# Patient Record
Sex: Male | Born: 1955 | Race: White | Hispanic: No | Marital: Single | Smoking: Never smoker
Health system: Southern US, Community
[De-identification: ages and names within clinical notes are randomized; demographics above are authoritative.]

## PROBLEM LIST (undated history)

## (undated) DIAGNOSIS — M5430 Sciatica, unspecified side: Secondary | ICD-10-CM

## (undated) DIAGNOSIS — R933 Abnormal findings on diagnostic imaging of other parts of digestive tract: Secondary | ICD-10-CM

## (undated) DIAGNOSIS — Z9852 Vasectomy status: Secondary | ICD-10-CM

## (undated) DIAGNOSIS — Z9049 Acquired absence of other specified parts of digestive tract: Secondary | ICD-10-CM

## (undated) DIAGNOSIS — N400 Enlarged prostate without lower urinary tract symptoms: Secondary | ICD-10-CM

## (undated) DIAGNOSIS — D649 Anemia, unspecified: Secondary | ICD-10-CM

## (undated) DIAGNOSIS — K219 Gastro-esophageal reflux disease without esophagitis: Secondary | ICD-10-CM

## (undated) DIAGNOSIS — C189 Malignant neoplasm of colon, unspecified: Secondary | ICD-10-CM

## (undated) DIAGNOSIS — G629 Polyneuropathy, unspecified: Secondary | ICD-10-CM

## (undated) DIAGNOSIS — K649 Unspecified hemorrhoids: Secondary | ICD-10-CM

## (undated) DIAGNOSIS — Z9889 Other specified postprocedural states: Secondary | ICD-10-CM

## (undated) HISTORY — DX: Abnormal findings on diagnostic imaging of other parts of digestive tract: R93.3

## (undated) HISTORY — DX: Vasectomy status: Z98.52

## (undated) HISTORY — PX: APPENDECTOMY: SHX54

## (undated) HISTORY — PX: VASECTOMY: SHX75

## (undated) HISTORY — DX: Other specified postprocedural states: Z98.890

## (undated) HISTORY — DX: Acquired absence of other specified parts of digestive tract: Z90.49

## (undated) HISTORY — DX: Sciatica, unspecified side: M54.30

## (undated) HISTORY — DX: Gastro-esophageal reflux disease without esophagitis: K21.9

## (undated) HISTORY — DX: Unspecified hemorrhoids: K64.9

## (undated) HISTORY — DX: Benign prostatic hyperplasia without lower urinary tract symptoms: N40.0

---

## 1979-05-08 HISTORY — PX: VEIN LIGATION: SHX2652

## 1979-09-07 DIAGNOSIS — Z9889 Other specified postprocedural states: Secondary | ICD-10-CM

## 1979-09-07 HISTORY — DX: Other specified postprocedural states: Z98.890

## 1988-09-06 DIAGNOSIS — Z9852 Vasectomy status: Secondary | ICD-10-CM

## 1988-09-06 HISTORY — DX: Vasectomy status: Z98.52

## 2011-12-08 ENCOUNTER — Emergency Department: Payer: Self-pay | Admitting: Emergency Medicine

## 2016-01-01 ENCOUNTER — Encounter: Payer: Self-pay | Admitting: *Deleted

## 2016-01-15 ENCOUNTER — Encounter: Payer: Self-pay | Admitting: General Surgery

## 2016-01-15 ENCOUNTER — Ambulatory Visit (INDEPENDENT_AMBULATORY_CARE_PROVIDER_SITE_OTHER): Payer: BLUE CROSS/BLUE SHIELD | Admitting: General Surgery

## 2016-01-15 DIAGNOSIS — K219 Gastro-esophageal reflux disease without esophagitis: Secondary | ICD-10-CM

## 2016-01-15 DIAGNOSIS — D649 Anemia, unspecified: Secondary | ICD-10-CM

## 2016-01-15 MED ORDER — POLYETHYLENE GLYCOL 3350 17 GM/SCOOP PO POWD: ORAL | Status: DC

## 2016-01-15 NOTE — Patient Instructions (Addendum)
Colonoscopy A colonoscopy is an exam to look at the entire large intestine (colon). This exam can help find problems such as tumors, polyps, inflammation, and areas of bleeding. The exam takes about 1 hour.  LET [facility] CARE PROVIDER KNOW ABOUT:   Any allergies you have.  All medicines you are taking, including vitamins, herbs, eye drops, creams, and over-the-counter medicines.  Previous problems you or members of your family have had with the use of anesthetics.  Any blood disorders you have.  Previous surgeries you have had.  Medical conditions you have. RISKS AND COMPLICATIONS  Generally, this is a safe procedure. However, as with any procedure, complications can occur. Possible complications include:  Bleeding.  Tearing or rupture of the colon wall.  Reaction to medicines given during the exam.  Infection (rare). BEFORE THE PROCEDURE   Ask your health care provider about changing or stopping your regular medicines.  You may be prescribed an oral bowel prep. This involves drinking a large amount of medicated liquid, starting the day before your procedure. The liquid will cause you to have multiple loose stools until your stool is almost clear or light green. This cleans out your colon in preparation for the procedure.  Do not eat or drink anything else once you have started the bowel prep, unless your health care provider tells you it is safe to do so.  Arrange for someone to drive you home after the procedure. PROCEDURE   You will be given medicine to help you relax (sedative).  You will lie on your side with your knees bent.  A long, flexible tube with a light and camera on the end (colonoscope) will be inserted through the rectum and into the colon. The camera sends video back to a computer screen as it moves through the colon. The colonoscope also releases carbon dioxide gas to inflate the colon. This helps your health care provider see the area better.  During  the exam, your health care provider may take a small tissue sample (biopsy) to be examined under a microscope if any abnormalities are found.  The exam is finished when the entire colon has been viewed. AFTER THE PROCEDURE   Do not drive for 24 hours after the exam.  You may have a small amount of blood in your stool.  You may pass moderate amounts of gas and have mild abdominal cramping or bloating. This is caused by the gas used to inflate your colon during the exam.  Ask when your test results will be ready and how you will get your results. Make sure you get your test results.   This information is not intended to replace advice given to you by your health care provider. Make sure you discuss any questions you have with your health care provider.   Document Released: 08/20/2000 Document Revised: 06/13/2013 Document Reviewed: 04/30/2013 Elsevier Interactive Patient Education 2016 [place]. Esophagogastroduodenoscopy Esophagogastroduodenoscopy (EGD) is a procedure that is used to examine the lining of the esophagus, stomach, and first part of the small intestine (duodenum). A long, flexible, lighted tube with a camera attached (endoscope) is inserted down the throat to view these organs. This procedure is done to detect problems or abnormalities, such as inflammation, bleeding, ulcers, or growths, in order to treat them. The procedure lasts 5-20 minutes. It is usually an outpatient procedure, but it may need to be performed in a hospital in emergency cases. LET [facility] CARE PROVIDER KNOW ABOUT:  Any allergies you have.  All medicines you are taking, including vitamins, herbs, eye drops, creams, and over-the-counter medicines.  Previous problems you or members of your family have had with the use of anesthetics.  Any blood disorders you have.  Previous surgeries you have had.  Medical conditions you have. RISKS AND COMPLICATIONS Generally, this is a safe procedure. However,  problems can occur and include:  Infection.  Bleeding.  Tearing (perforation) of the esophagus, stomach, or duodenum.  Difficulty breathing or not being able to breathe.  Excessive sweating.  Spasms of the larynx.  Slowed heartbeat.  Low blood pressure. BEFORE THE PROCEDURE  Do not eat or drink anything after midnight on the night before the procedure or as directed by your health care provider.  Do not take your regular medicines before the procedure if your health care provider asks you not to. Ask your health care provider about changing or stopping those medicines.  If you wear dentures, be prepared to remove them before the procedure.  Arrange for someone to drive you home after the procedure. PROCEDURE  A numbing medicine (local anesthetic) may be sprayed in your throat for comfort and to stop you from gagging or coughing.  You will have an IV tube inserted in a vein in your hand or arm. You will receive medicines and fluids through this tube.  You will be given a medicine to relax you (sedative).  A pain reliever will be given through the IV tube.  A mouth guard may be placed in your mouth to protect your teeth and to keep you from biting on the endoscope.  You will be asked to lie on your left side.  The endoscope will be inserted down your throat and into your esophagus, stomach, and duodenum.  Air will be put through the endoscope to allow your health care provider to clearly view the lining of your esophagus.  The lining of your esophagus, stomach, and duodenum will be examined. During the exam, your health care provider may:  Remove tissue to be examined under a microscope (biopsy) for inflammation, infection, or other medical problems.  Remove growths.  Remove objects (foreign bodies) that are stuck.  Treat any bleeding with medicines or other devices that stop tissues from bleeding (hot cautery, clipping devices).  Widen (dilate) or stretch narrowed  areas of your esophagus and stomach.  The endoscope will be withdrawn. AFTER THE PROCEDURE  You will be taken to a recovery area for observation. Your blood pressure, heart rate, breathing rate, and blood oxygen level will be monitored often until the medicines you were given have worn off.  Do not eat or drink anything until the numbing medicine has worn off and your gag reflex has returned. You may choke.  Your health care provider should be able to discuss his or her findings with you. It will take longer to discuss the test results if any biopsies were taken.   This information is not intended to replace advice given to you by your health care provider. Make sure you discuss any questions you have with your health care provider.   Document Released: 12/24/2004 Document Revised: 09/13/2014 Document Reviewed: 07/26/2012 Elsevier Interactive Patient Education [employer].  Patient has been scheduled for a colonoscopy on 02-11-16 at [facility]. This patient has been asked to discontinue fish oil one week prior to procedure.

## 2016-01-15 NOTE — Progress Notes (Signed)
Patient ID: Philip Shah, male   DOB: [birth year], 60 y.o.   MRN: [id number]  Chief Complaint  Patient presents with  . Colonoscopy    HPI Philip Shah is a 60 y.o. male.  Who presents for a colonoscopy discussion, none prior. He has not seen a physical in several years. Denies any gastrointestinal issues. Bowels move regular with occasional bleeding from his hemorrhoids. He does do a lot of heavy lifting during his job. He states his labs showed he was anemic. No family history of colon cancer. I have reviewed the history of present illness with the patient. HPI  Past Medical History  Diagnosis Date  . Enlarged prostate   . GERD (gastroesophageal reflux disease)   . Sciatica   . Hemorrhoids     Past Surgical History  Procedure Laterality Date  . Vein ligation Right 1980's    Dr Pat Kocher  . Vasectomy      Family History  Problem Relation Age of Onset  . Cancer Father     spine  . Kidney failure Mother     Social History Social History  Substance Use Topics  . Smoking status: Never Smoker   . Smokeless tobacco: Never Used  . Alcohol Use: 0.0 oz/week    0 Standard drinks or equivalent per week     Comment: 3/day    Allergies  Allergen Reactions  . Penicillins     Current Outpatient Prescriptions  Medication Sig Dispense Refill  . Omega-3 Fatty Acids (FISH OIL EXTRA STRENGTH PO) Take by mouth every other day.    . Saw Palmetto 80 MG CAPS Take by mouth every other day.    . polyethylene glycol powder (GLYCOLAX/MIRALAX) powder 255 grams one bottle for colonoscopy prep 255 g 0   No current facility-administered medications for this visit.    Review of Systems Review of Systems  Constitutional: Negative.   Respiratory: Negative.   Cardiovascular: Negative.   Gastrointestinal: Positive for blood in stool. Negative for nausea, diarrhea and constipation.    Blood pressure 142/76, pulse 84, resp. rate 12, height 5\' 7"  (1.702 m), weight 205 lb  (92.987 kg).  Physical Exam Physical Exam  Constitutional: He is oriented to person, place, and time. He appears well-developed and well-nourished.  HENT:  Mouth/Throat: Oropharynx is clear and moist.  Eyes: Conjunctivae are normal. No scleral icterus.  Neck: Neck supple.  Cardiovascular: Normal rate, regular rhythm and normal heart sounds.   Pulses:      Femoral pulses are 2+ on the right side, and 2+ on the left side. Pulmonary/Chest: Effort normal and breath sounds normal.  Abdominal: Soft. Normal appearance and bowel sounds are normal. There is no tenderness. A hernia is present. Hernia confirmed negative in the right inguinal area and confirmed negative in the left inguinal area.  Small umbilical hernia.  Lymphadenopathy:    He has no cervical adenopathy.  Neurological: He is alert and oriented to person, place, and time.  Skin: Skin is warm and dry.  Psychiatric: His behavior is normal.    Data Reviewed Progress notes and labs. Hgb is 9.3 with microcytic indices  Assessment    Stable physical exam. Anemia and GERD.    Plan    Upper endoscopy and colonoscopy with possible biopsy/polypectomy prn: Information regarding the procedure, including its potential risks and complications (including but not limited to perforation of the bowel, which may require emergency surgery to repair, and bleeding) was verbally given to the patient. Educational information regarding  lower intestinal endoscopy was given to the patient. Written instructions for how to complete the bowel prep using Miralax were provided. The importance of drinking ample fluids to avoid dehydration as a result of the prep emphasized. Pt is agreeable to procedure. Stool cards given to pt to bring back Patient has been scheduled for a colonoscopy on 02-11-16 at [facility]. This patient has been asked to discontinue fish oil one week prior to procedure.      PCP:  Philip Shah  This information has been scribed by Karie Fetch RN, BSN,BC.   Claiborne Stroble G 01/15/2016, 11:41 AM

## 2016-01-19 ENCOUNTER — Ambulatory Visit (INDEPENDENT_AMBULATORY_CARE_PROVIDER_SITE_OTHER): Payer: BLUE CROSS/BLUE SHIELD | Admitting: *Deleted

## 2016-01-19 DIAGNOSIS — K921 Melena: Secondary | ICD-10-CM | POA: Diagnosis not present

## 2016-01-19 LAB — POC HEMOCCULT BLD/STL (HOME/3-CARD/SCREEN)
FECAL OCCULT BLD: POSITIVE
FECAL OCCULT BLD: POSITIVE
Fecal Occult Blood, POC: POSITIVE — AB

## 2016-01-19 NOTE — Progress Notes (Signed)
Stool cards from home 01/19/16. Positive.

## 2016-02-05 DIAGNOSIS — R933 Abnormal findings on diagnostic imaging of other parts of digestive tract: Secondary | ICD-10-CM

## 2016-02-05 HISTORY — DX: Abnormal findings on diagnostic imaging of other parts of digestive tract: R93.3

## 2016-02-10 ENCOUNTER — Encounter: Payer: Self-pay | Admitting: *Deleted

## 2016-02-11 ENCOUNTER — Encounter
Admission: RE | Disposition: A | Discharge status: Home / Self Care | Payer: Self-pay | Source: Ambulatory Visit | Attending: General Surgery

## 2016-02-11 ENCOUNTER — Ambulatory Visit: Payer: BLUE CROSS/BLUE SHIELD | Admitting: Anesthesiology

## 2016-02-11 ENCOUNTER — Encounter: Payer: Self-pay | Admitting: *Deleted

## 2016-02-11 ENCOUNTER — Other Ambulatory Visit: Payer: Self-pay

## 2016-02-11 ENCOUNTER — Ambulatory Visit
Admission: RE | Admit: 2016-02-11 | Discharge: 2016-02-11 | Disposition: A | Discharge status: Home / Self Care | Payer: BLUE CROSS/BLUE SHIELD | Source: Ambulatory Visit | Attending: General Surgery | Admitting: General Surgery

## 2016-02-11 ENCOUNTER — Telehealth: Payer: Self-pay

## 2016-02-11 DIAGNOSIS — D509 Iron deficiency anemia, unspecified: Secondary | ICD-10-CM | POA: Diagnosis present

## 2016-02-11 DIAGNOSIS — Z880 Allergy status to penicillin: Secondary | ICD-10-CM | POA: Diagnosis not present

## 2016-02-11 DIAGNOSIS — K6389 Other specified diseases of intestine: Secondary | ICD-10-CM

## 2016-02-11 DIAGNOSIS — K219 Gastro-esophageal reflux disease without esophagitis: Secondary | ICD-10-CM | POA: Diagnosis not present

## 2016-02-11 DIAGNOSIS — N400 Enlarged prostate without lower urinary tract symptoms: Secondary | ICD-10-CM | POA: Insufficient documentation

## 2016-02-11 DIAGNOSIS — K5790 Diverticulosis of intestine, part unspecified, without perforation or abscess without bleeding: Secondary | ICD-10-CM | POA: Insufficient documentation

## 2016-02-11 DIAGNOSIS — C182 Malignant neoplasm of ascending colon: Secondary | ICD-10-CM | POA: Diagnosis not present

## 2016-02-11 DIAGNOSIS — K921 Melena: Secondary | ICD-10-CM

## 2016-02-11 DIAGNOSIS — R195 Other fecal abnormalities: Secondary | ICD-10-CM | POA: Diagnosis not present

## 2016-02-11 HISTORY — PX: COLONOSCOPY WITH PROPOFOL: SHX5780

## 2016-02-11 HISTORY — PX: ESOPHAGOGASTRODUODENOSCOPY (EGD) WITH PROPOFOL: SHX5813

## 2016-02-11 SURGERY — COLONOSCOPY WITH PROPOFOL
Anesthesia: General

## 2016-02-11 MED ORDER — PHENYLEPHRINE HCL 10 MG/ML IJ SOLN
INTRAMUSCULAR | Status: DC | PRN
  Administered 2016-02-11 (×2): 100 ug via INTRAVENOUS

## 2016-02-11 MED ORDER — PROPOFOL 500 MG/50ML IV EMUL
INTRAVENOUS | Status: DC | PRN
  Administered 2016-02-11: 200 ug/kg/min via INTRAVENOUS

## 2016-02-11 MED ORDER — PROPOFOL 10 MG/ML IV BOLUS
INTRAVENOUS | Status: DC | PRN
  Administered 2016-02-11: 20 mg via INTRAVENOUS
  Administered 2016-02-11: 100 mg via INTRAVENOUS

## 2016-02-11 MED ORDER — SODIUM CHLORIDE 0.9 % IV SOLN
INTRAVENOUS | Status: DC
  Administered 2016-02-11: 1000 mL via INTRAVENOUS
  Administered 2016-02-11: 11:00:00 via INTRAVENOUS

## 2016-02-11 MED ORDER — LIDOCAINE 2% (20 MG/ML) 5 ML SYRINGE
INTRAMUSCULAR | Status: DC | PRN
  Administered 2016-02-11: 40 mg via INTRAVENOUS

## 2016-02-11 MED ORDER — MIDAZOLAM HCL 5 MG/5ML IJ SOLN
INTRAMUSCULAR | Status: DC | PRN
  Administered 2016-02-11: 1 mg via INTRAVENOUS

## 2016-02-11 MED ORDER — FENTANYL CITRATE (PF) 100 MCG/2ML IJ SOLN
INTRAMUSCULAR | Status: DC | PRN
  Administered 2016-02-11: 50 ug via INTRAVENOUS

## 2016-02-11 NOTE — Anesthesia Preprocedure Evaluation (Signed)
Anesthesia Evaluation  Patient identified by MRN, date of birth, ID band Patient awake    Reviewed: Allergy & Precautions, H&P , NPO status , Patient's Chart, lab work & pertinent test results, reviewed documented beta blocker date and time   History of Anesthesia Complications Negative for: history of anesthetic complications  Airway Mallampati: III  TM Distance: >3 FB Neck ROM: full    Dental no notable dental hx. (+) Caps   Pulmonary neg pulmonary ROS,    Pulmonary exam normal breath sounds clear to auscultation       Cardiovascular Exercise Tolerance: Good negative cardio ROS Normal cardiovascular exam Rhythm:regular Rate:Normal     Neuro/Psych neg Seizures  Neuromuscular disease (Sciatica) negative neurological ROS  negative psych ROS   GI/Hepatic Neg liver ROS, GERD  ,  Endo/Other  negative endocrine ROS  Renal/GU negative Renal ROS  negative genitourinary   Musculoskeletal   Abdominal   Peds  Hematology negative hematology ROS (+)   Anesthesia Other Findings Past Medical History:   Enlarged prostate                                            GERD (gastroesophageal reflux disease)                       Hemorrhoids                                                  Sciatica                                                     Reproductive/Obstetrics negative OB ROS                             Anesthesia Physical Anesthesia Plan  ASA: II  Anesthesia Plan: General   Post-op Pain Management:    Induction:   Airway Management Planned:   Additional Equipment:   Intra-op Plan:   Post-operative Plan:   Informed Consent: I have reviewed the patients History and Physical, chart, labs and discussed the procedure including the risks, benefits and alternatives for the proposed anesthesia with the patient or authorized representative who has indicated his/her understanding and  acceptance.   Dental Advisory Given  Plan Discussed with: Anesthesiologist, CRNA and Surgeon  Anesthesia Plan Comments:         Anesthesia Quick Evaluation

## 2016-02-11 NOTE — Op Note (Signed)
[facility]astroenterology Patient Name: Philip Shah Procedure Date: 02/11/2016 10:35 AM MRN: [id number] Account #: [id number] Date of Birth: Jan 24, 1956 Admit Type: Outpatient Age: 60 Room: [facility] ENDO ROOM 1 Gender: Male Note Status: Finalized Procedure:            Colonoscopy Indications:          Heme positive stool, Iron deficiency anemia Providers:            Seeplaputhur G. Jamal Collin, MD Referring MD:         Mikeal Hawthorne. Brynda Greathouse, MD (Referring MD) Medicines:            General Anesthesia Complications:        No immediate complications. Procedure:            Pre-Anesthesia Assessment:                       - General anesthesia under the supervision of an                        anesthesiologist was determined to be medically                        necessary for this procedure based on review of the                        patient's medical history, medications, and prior                        anesthesia history.                       After obtaining informed consent, the colonoscope was                        passed under direct vision. Throughout the procedure,                        the patient's blood pressure, pulse, and oxygen                        saturations were monitored continuously. The                        Colonoscope was introduced through the anus and                        advanced to the the cecum, identified by the ileocecal                        valve. The colonoscopy was performed without                        difficulty. The patient tolerated the procedure well.                        The quality of the bowel preparation was good. Findings:      The perianal and digital rectal examinations were normal.      A 50 mm polypoid lesion was found in the mid ascending colon. The lesion       was sessile. This was biopsied with a cold forceps  for histology.      The exam was otherwise without abnormality on direct and retroflexion   views.      The exam was otherwise without abnormality on direct and retroflexion       views. 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                        Biopsied.                       - The examination was otherwise normal on direct and                        retroflexion views.                       - The examination was otherwise normal on direct and                        retroflexion views. Recommendation:       - Discharge patient to home.                       - Perform CT scan (computed tomography) of the abdomen                        with contrast. Procedure Code(s):    --- Professional ---                       [phone], Colonoscopy, flexible; with biopsy, single or                        multiple Diagnosis Code(s):    --- Professional ---                       C18.2, Malignant neoplasm of ascending colon                       R19.5, Other fecal abnormalities                       D50.9, Iron deficiency anemia, unspecified CPT copyright 2016 American Medical Association. All rights reserved. The codes documented in this report are preliminary and upon coder review may  be revised to meet current compliance requirements. Christene Lye, MD 02/11/2016 11:25:44 AM This report has been signed electronically. Number of Addenda: 0 Note Initiated On: 02/11/2016 10:35 AM Scope Withdrawal Time: 0 hours 11 minutes 17 seconds  Total Procedure Duration: 0 hours 21 minutes 7 seconds       [facility]

## 2016-02-11 NOTE — Anesthesia Postprocedure Evaluation (Signed)
Anesthesia Post Note  Patient: Philip Shah  Procedure(s) Performed: Procedure(s) (LRB): COLONOSCOPY WITH PROPOFOL (N/A) ESOPHAGOGASTRODUODENOSCOPY (EGD) WITH PROPOFOL (N/A)  Patient location during evaluation: Endoscopy Anesthesia Type: General Level of consciousness: awake and alert Pain management: pain level controlled Vital Signs Assessment: post-procedure vital signs reviewed and stable Respiratory status: spontaneous breathing, nonlabored ventilation, respiratory function stable and patient connected to nasal cannula oxygen Cardiovascular status: blood pressure returned to baseline and stable Postop Assessment: no signs of nausea or vomiting Anesthetic complications: no    Last Vitals:  Filed Vitals:   02/11/16 1148 02/11/16 1158  BP: 133/79 132/85  Pulse: 73 66  Temp:    Resp: 19 21    Last Pain: There were no vitals filed for this visit.               Martha Clan

## 2016-02-11 NOTE — Interval H&P Note (Signed)
History and Physical Interval Note:  02/11/2016 10:21 AM  Philip Shah  has presented today for surgery, with the diagnosis of MICROCYTIC ANEMIA  The various methods of treatment have been discussed with the patient and family. After consideration of risks, benefits and other options for treatment, the patient has consented to  Procedure(s): COLONOSCOPY WITH PROPOFOL (N/A) ESOPHAGOGASTRODUODENOSCOPY (EGD) WITH PROPOFOL (N/A) as a surgical intervention .  The patient's history has been reviewed, patient examined, no change in status, stable for surgery.  I have reviewed the patient's chart and labs.  Questions were answered to the patient's satisfaction.     SANKAR,SEEPLAPUTHUR G

## 2016-02-11 NOTE — Telephone Encounter (Signed)
Message left for patient to call about scheduling at CT of the abdomen/pelvis with contrast.

## 2016-02-11 NOTE — Transfer of Care (Signed)
Immediate Anesthesia Transfer of Care Note  Patient: Philip Shah  Procedure(s) Performed: Procedure(s): COLONOSCOPY WITH PROPOFOL (N/A) ESOPHAGOGASTRODUODENOSCOPY (EGD) WITH PROPOFOL (N/A)  Patient Location: PACU and Endoscopy Unit  Anesthesia Type:General  Level of Consciousness: sedated  Airway & Oxygen Therapy: Patient Spontanous Breathing and Patient connected to nasal cannula oxygen  Post-op Assessment: Report given to RN and Post -op Vital signs reviewed and stable  Post vital signs: Reviewed and stable  Last Vitals:  Filed Vitals:   02/11/16 0954  BP: 155/93  Pulse: 78  Temp: 36.5 C  Resp: 20    Last Pain: There were no vitals filed for this visit.       Complications: No apparent anesthesia complications

## 2016-02-11 NOTE — Op Note (Signed)
[facility]astroenter[facility] Patient Name: Philip Shah Procedure Date: 02/11/2016 10:36 AM MRN: [id number] Account #: [id number] Date of Birth: 18-Oct-1955 Admit Type: Outpatient Age: 60 Room: [facility] ENDO ROOM 1 Gender: Male Note Status: Finalized Procedure:            Upper GI endoscopy Indications:          Iron deficiency anemia Providers:            Seeplaputhur G. Jamal Collin, MD Referring MD:         Mikeal Hawthorne. Brynda Greathouse, MD (Referring MD) Medicines:            General Anesthesia Complications:        No immediate complications. Procedure:            Pre-Anesthesia Assessment:                       - General anesthesia under the supervision of an                        anesthesiologist was determined to be medically                        necessary for this procedure based on review of the                        patient's medical history, medications, and prior                        anesthesia history.                       After obtaining informed consent, the endoscope was                        passed under direct vision. Throughout the procedure,                        the patient's blood pressure, pulse, and oxygen                        saturations were monitored continuously. The Endoscope                        was introduced through the mouth, and advanced to the                        third part of duodenum. The upper GI endoscopy was                        accomplished without difficulty. The patient tolerated                        the procedure well. Findings:      The esophagus was normal.      The stomach was normal.      The examined duodenum was normal. Impression:           - Normal esophagus.                       - Normal stomach.                       -  Normal examined duodenum.                       - No specimens collected. Recommendation:       - Perform a colonoscopy today. Procedure Code(s):    --- Professional ---     [phone], Esophagogastroduodenoscopy, flexible, transoral;                        diagnostic, including collection of specimen(s) by                        brushing or washing, when performed (separate procedure) Diagnosis Code(s):    --- Professional ---                       D50.9, Iron deficiency anemia, unspecified CPT copyright 2016 American Medical Association. All rights reserved. The codes documented in this report are preliminary and upon coder review may  be revised to meet current compliance requirements. Christene Lye, MD 02/11/2016 10:55:07 AM This report has been signed electronically. Number of Addenda: 0 Note Initiated On: 02/11/2016 10:36 AM Total Procedure Duration: 0 hours 6 minutes 4 seconds       [facility]

## 2016-02-11 NOTE — H&P (View-Only) (Signed)
Patient ID: Philip Shah, male   DOB: 1955-10-29, 59 y.o.   MRN: [id number]  Chief Complaint  Patient presents with  . Colonoscopy    HPI Philip Shah is a 60 y.o. male.  Who presents for a colonoscopy discussion, none prior. He has not seen a physical in several years. Denies any gastrointestinal issues. Bowels move regular with occasional bleeding from his hemorrhoids. He does do a lot of heavy lifting during his job. He states his labs showed he was anemic. No family history of colon cancer. I have reviewed the history of present illness with the patient. HPI  Past Medical History  Diagnosis Date  . Enlarged prostate   . GERD (gastroesophageal reflux disease)   . Sciatica   . Hemorrhoids     Past Surgical History  Procedure Laterality Date  . Vein ligation Right 1980's    Dr Pat Kocher  . Vasectomy      Family History  Problem Relation Age of Onset  . Cancer Father     spine  . Kidney failure Mother     Social History Social History  Substance Use Topics  . Smoking status: Never Smoker   . Smokeless tobacco: Never Used  . Alcohol Use: 0.0 oz/week    0 Standard drinks or equivalent per week     Comment: 3/day    Allergies  Allergen Reactions  . Penicillins     Current Outpatient Prescriptions  Medication Sig Dispense Refill  . Omega-3 Fatty Acids (FISH OIL EXTRA STRENGTH PO) Take by mouth every other day.    . Saw Palmetto 80 MG CAPS Take by mouth every other day.    . polyethylene glycol powder (GLYCOLAX/MIRALAX) powder 255 grams one bottle for colonoscopy prep 255 g 0   No current facility-administered medications for this visit.    Review of Systems Review of Systems  Constitutional: Negative.   Respiratory: Negative.   Cardiovascular: Negative.   Gastrointestinal: Positive for blood in stool. Negative for nausea, diarrhea and constipation.    Blood pressure 142/76, pulse 84, resp. rate 12, height 5\' 7"  (1.702 m), weight 205 lb  (92.987 kg).  Physical Exam Physical Exam  Constitutional: He is oriented to person, place, and time. He appears well-developed and well-nourished.  HENT:  Mouth/Throat: Oropharynx is clear and moist.  Eyes: Conjunctivae are normal. No scleral icterus.  Neck: Neck supple.  Cardiovascular: Normal rate, regular rhythm and normal heart sounds.   Pulses:      Femoral pulses are 2+ on the right side, and 2+ on the left side. Pulmonary/Chest: Effort normal and breath sounds normal.  Abdominal: Soft. Normal appearance and bowel sounds are normal. There is no tenderness. A hernia is present. Hernia confirmed negative in the right inguinal area and confirmed negative in the left inguinal area.  Small umbilical hernia.  Lymphadenopathy:    He has no cervical adenopathy.  Neurological: He is alert and oriented to person, place, and time.  Skin: Skin is warm and dry.  Psychiatric: His behavior is normal.    Data Reviewed Progress notes and labs. Hgb is 9.3 with microcytic indices  Assessment    Stable physical exam. Anemia and GERD.    Plan    Upper endoscopy and colonoscopy with possible biopsy/polypectomy prn: Information regarding the procedure, including its potential risks and complications (including but not limited to perforation of the bowel, which may require emergency surgery to repair, and bleeding) was verbally given to the patient. Educational information regarding  lower intestinal endoscopy was given to the patient. Written instructions for how to complete the bowel prep using Miralax were provided. The importance of drinking ample fluids to avoid dehydration as a result of the prep emphasized. Pt is agreeable to procedure. Stool cards given to pt to bring back Patient has been scheduled for a colonoscopy on 02-11-16 at [facility]. This patient has been asked to discontinue fish oil one week prior to procedure.      PCP:  Nicky Pugh  This information has been scribed by Karie Fetch RN, BSN,BC.   Nollan Muldrow G 01/15/2016, 11:41 AM

## 2016-02-12 ENCOUNTER — Encounter: Payer: Self-pay | Admitting: General Surgery

## 2016-02-12 LAB — SURGICAL PATHOLOGY

## 2016-02-13 ENCOUNTER — Other Ambulatory Visit: Payer: Self-pay

## 2016-02-13 DIAGNOSIS — C189 Malignant neoplasm of colon, unspecified: Secondary | ICD-10-CM

## 2016-02-13 NOTE — Progress Notes (Signed)
The patient is scheduled for a CT with contrast at [facility]. He will arrive at 9 am on 02/18/16 for lab work prior to his scan. The patient will pick up a prep kit and have nothing to eat or drink after midnight the night prior. Patient is aware of date, time, and instructions.  

## 2016-02-16 NOTE — Telephone Encounter (Signed)
Patient is scheduled for a CT at [facility] on 02/18/16. Patient is aware of date, time, and instructions.

## 2016-02-17 ENCOUNTER — Other Ambulatory Visit: Payer: Self-pay

## 2016-02-17 DIAGNOSIS — K921 Melena: Secondary | ICD-10-CM

## 2016-02-17 DIAGNOSIS — K6389 Other specified diseases of intestine: Secondary | ICD-10-CM

## 2016-02-18 ENCOUNTER — Telehealth: Payer: Self-pay | Admitting: *Deleted

## 2016-02-18 ENCOUNTER — Other Ambulatory Visit
Admission: RE | Admit: 2016-02-18 | Discharge: 2016-02-18 | Disposition: A | Discharge status: Home / Self Care | Payer: BLUE CROSS/BLUE SHIELD | Source: Ambulatory Visit | Attending: General Surgery | Admitting: General Surgery

## 2016-02-18 ENCOUNTER — Ambulatory Visit
Admission: RE | Admit: 2016-02-18 | Discharge: 2016-02-18 | Disposition: A | Discharge status: Home / Self Care | Payer: BLUE CROSS/BLUE SHIELD | Source: Ambulatory Visit | Attending: General Surgery | Admitting: General Surgery

## 2016-02-18 DIAGNOSIS — K760 Fatty (change of) liver, not elsewhere classified: Secondary | ICD-10-CM | POA: Diagnosis not present

## 2016-02-18 DIAGNOSIS — K769 Liver disease, unspecified: Secondary | ICD-10-CM | POA: Insufficient documentation

## 2016-02-18 DIAGNOSIS — K921 Melena: Secondary | ICD-10-CM | POA: Diagnosis not present

## 2016-02-18 DIAGNOSIS — K6389 Other specified diseases of intestine: Secondary | ICD-10-CM

## 2016-02-18 DIAGNOSIS — I708 Atherosclerosis of other arteries: Secondary | ICD-10-CM | POA: Insufficient documentation

## 2016-02-18 LAB — CBC WITH DIFFERENTIAL/PLATELET
BASOS ABS: 0.1 10*3/uL (ref 0–0.1)
Basophils Relative: 1 %
EOS ABS: 0.1 10*3/uL (ref 0–0.7)
HCT: 32.1 % — ABNORMAL LOW (ref 40.0–52.0)
Hemoglobin: 9.5 g/dL — ABNORMAL LOW (ref 13.0–18.0)
Lymphs Abs: 1.9 10*3/uL (ref 1.0–3.6)
MCH: 18.5 pg — AB (ref 26.0–34.0)
MCHC: 29.6 g/dL — ABNORMAL LOW (ref 32.0–36.0)
MCV: 62.6 fL — AB (ref 80.0–100.0)
MONO ABS: 0.8 10*3/uL (ref 0.2–1.0)
Monocytes Relative: 8 %
Neutro Abs: 7.5 10*3/uL — ABNORMAL HIGH (ref 1.4–6.5)
Neutrophils Relative %: 72 %
PLATELETS: 402 10*3/uL (ref 150–440)
RBC: 5.12 MIL/uL (ref 4.40–5.90)
RDW: 19 % — AB (ref 11.5–14.5)
WBC: 10.4 10*3/uL (ref 3.8–10.6)

## 2016-02-18 LAB — COMPREHENSIVE METABOLIC PANEL
ALK PHOS: 71 U/L (ref 38–126)
ALT: 19 U/L (ref 17–63)
ANION GAP: 6 (ref 5–15)
AST: 19 U/L (ref 15–41)
Albumin: 3.9 g/dL (ref 3.5–5.0)
BILIRUBIN TOTAL: 0.5 mg/dL (ref 0.3–1.2)
BUN: 14 mg/dL (ref 6–20)
CALCIUM: 8.6 mg/dL — AB (ref 8.9–10.3)
CO2: 27 mmol/L (ref 22–32)
Chloride: 103 mmol/L (ref 101–111)
Creatinine, Ser: 0.84 mg/dL (ref 0.61–1.24)
GLUCOSE: 119 mg/dL — AB (ref 65–99)
POTASSIUM: 4.3 mmol/L (ref 3.5–5.1)
SODIUM: 136 mmol/L (ref 135–145)
TOTAL PROTEIN: 7 g/dL (ref 6.5–8.1)

## 2016-02-18 MED ORDER — IOPAMIDOL (ISOVUE-300) INJECTION 61%
100.0000 mL | Freq: Once | INTRAVENOUS | Status: AC | PRN
  Administered 2016-02-18: 100 mL via INTRAVENOUS

## 2016-02-18 NOTE — Telephone Encounter (Signed)
Message for patient to call the office.   Dr. Jamal Collin would like to see the patient tomorrow, 02-19-16, to discuss results of CT scan. Patient scheduled for CT abdomen/pelvis at [facility] today, 02-18-16.

## 2016-02-19 ENCOUNTER — Ambulatory Visit (INDEPENDENT_AMBULATORY_CARE_PROVIDER_SITE_OTHER): Payer: BLUE CROSS/BLUE SHIELD | Admitting: General Surgery

## 2016-02-19 ENCOUNTER — Encounter: Payer: Self-pay | Admitting: General Surgery

## 2016-02-19 VITALS — BP 156/82 | HR 74 | Resp 14 | Ht 66.0 in | Wt 201.0 lb

## 2016-02-19 DIAGNOSIS — C182 Malignant neoplasm of ascending colon: Secondary | ICD-10-CM | POA: Diagnosis not present

## 2016-02-19 LAB — CEA: CEA: 1.5 ng/mL (ref 0.0–4.7)

## 2016-02-19 MED ORDER — NEOMYCIN SULFATE 500 MG PO TABS: ORAL_TABLET | ORAL | Status: AC

## 2016-02-19 MED ORDER — METRONIDAZOLE 500 MG PO TABS: ORAL_TABLET | ORAL | Status: AC

## 2016-02-19 MED ORDER — POLYETHYLENE GLYCOL 3350 17 GM/SCOOP PO POWD: ORAL | Status: DC

## 2016-02-19 NOTE — Progress Notes (Addendum)
Patient ID: Philip Shah, male   DOB: 01/05/1956, 60 y.o.   MRN: [id number]  Chief Complaint  Patient presents with  . Follow-up    CT scan    HPI Philip Shah is a 60 y.o. male.  Here today for follow up from colonoscopy,, labs and CT scan. 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    He is here [place] with his brother, Merald Stonge. I have reviewed the history of present illness with the patient.    HPI  Past Medical History  Diagnosis Date  . Enlarged prostate   . GERD (gastroesophageal reflux disease)   . Hemorrhoids   . Sciatica     Past Surgical History  Procedure Laterality Date  . Vein ligation Right 1980's    Dr Pat Kocher  . Vasectomy    . Colonoscopy with propofol N/A 02/11/2016    Procedure: COLONOSCOPY WITH PROPOFOL;  Surgeon: Christene Lye, MD;  Location: [facility];  Service: Endoscopy;  Laterality: N/A;  . Esophagogastroduodenoscopy (egd) with propofol N/A 02/11/2016    Procedure: ESOPHAGOGASTRODUODENOSCOPY (EGD) WITH PROPOFOL;  Surgeon: Christene Lye, MD;  Location: [facility] ENDOSCOPY;  Service: Endoscopy;  Laterality: N/A;    Family History  Problem Relation Age of Onset  . Cancer Father     spine  . Kidney failure Mother     Social History Social History  Substance Use Topics  . Smoking status: Never Smoker   . Smokeless tobacco: Never Used  . Alcohol Use: 0.0 oz/week    0 Standard drinks or equivalent per week     Comment: 3/day    Allergies  Allergen Reactions  . Penicillins     Current Outpatient Prescriptions  Medication Sig Dispense Refill  . Omega-3 Fatty Acids (FISH OIL EXTRA STRENGTH PO) Take by mouth every other day.    . Saw Palmetto 80 MG CAPS Take by mouth every other day.    Derrill Memo ON 02/25/2016] metroNIDAZOLE (FLAGYL) 500 MG tablet Take one tablet (1) at 6 pm and one tablet (1) at 11 pm the evening prior to surgery. 2 tablet 0  . [START ON 02/25/2016] neomycin  (MYCIFRADIN) 500 MG tablet Take two tablets (2) at 6 pm and two tablets (2) at 11 pm the evening prior to surgery. 4 tablet 0  . polyethylene glycol powder (GLYCOLAX/MIRALAX) powder 255 grams one bottle for colonoscopy prep 255 g 0   No current facility-administered medications for this visit.    Review of Systems Review of Systems  Constitutional: Negative.   Respiratory: Negative.   Cardiovascular: Negative.     Blood pressure 156/82, pulse 74, resp. rate 14, height 5\' 6"  (1.676 m), weight 201 lb (91.173 kg).  Physical Exam Physical Exam  Constitutional: He is oriented to person, place, and time. He appears well-developed and well-nourished.  Eyes: Conjunctivae are normal. No scleral icterus.  Neck: Neck supple.  Cardiovascular: Normal rate, regular rhythm and normal heart sounds.   Pulmonary/Chest: Effort normal and breath sounds normal.  Abdominal: Soft. Bowel sounds are normal. He exhibits no distension and no mass. There is no tenderness.  Lymphadenopathy:    He has no cervical adenopathy.  Neurological: He is alert and oriented to person, place, and time.  Skin: Skin is warm and dry.  Psychiatric: His behavior is normal.    Data Reviewed CT scan and prior notesMass is localized to ascending colon and has not appeared to have spread to liver or other surrounding organs. Looks to  be locally advanced, but will not know about lymph node involvement until procedure. Laparoscopic approach will be used for removal of mass.  CEA was WNL.  Hgb is up to 9.8,     Assessment    Mass of the ascending colon  Iron deficiency anemia    Plan   Explained surgery, risks and benefits. Pt is agreeable and wishes to proceed  Will continue to monitor anemia by CBC.  No transfusion planned at this time, as long as Hb remains stable.   Laparoscopic removal of mass and bowel anastomosis of ilium and colon.Will schedule colectomy for next week.   Will continue to monitor CEA  post-procedure.     Patient's surgery has been scheduled for 02-26-16 at [facility]. This patient will be asked to complete a bowel prep with antibiotics. Instructions were reviewed today.      PCP:  Nicky Pugh This information has been scribed by Karie Fetch RN, BSN,BC.    Lunell Robart G 02/19/2016, 1:35 PM

## 2016-02-19 NOTE — Patient Instructions (Signed)
Patient's surgery has been scheduled for 02-26-16 at [facility].

## 2016-02-23 ENCOUNTER — Encounter: Payer: Self-pay | Admitting: *Deleted

## 2016-02-23 ENCOUNTER — Encounter
Admission: RE | Admit: 2016-02-23 | Discharge: 2016-02-23 | Disposition: A | Discharge status: Home / Self Care | Payer: BLUE CROSS/BLUE SHIELD | Source: Ambulatory Visit | Attending: General Surgery | Admitting: General Surgery

## 2016-02-23 DIAGNOSIS — C182 Malignant neoplasm of ascending colon: Secondary | ICD-10-CM | POA: Diagnosis not present

## 2016-02-23 LAB — TYPE AND SCREEN
ABO/RH(D): A POS
ANTIBODY SCREEN: NEGATIVE

## 2016-02-23 LAB — SURGICAL PCR SCREEN
MRSA, PCR: NEGATIVE
Staphylococcus aureus: POSITIVE — AB

## 2016-02-23 NOTE — Patient Instructions (Signed)
  Your procedure is scheduled on: 02/26/16 Report to Day Surgery.[facility]OND FLOOR To find out your arrival time please call [phone] between 1PM - 3PM on 02/25/16 Remember: Instructions that are not followed completely may result in serious medical risk, up to and including death, or upon the discretion of your surgeon and anesthesiologist your surgery may need to be rescheduled.    _X___ 1. Do not eat food or drink liquids after midnight. No gum chewing or hard candies.     _X___ 2. No Alcohol for 24 hours before or after surgery.   _X___ 3. Do Not Smoke For 24 Hours Prior to Your Surgery.   ____ 4. Bring all medications with you on the day of surgery if instructed.    _X___ 5. Notify your doctor if there is any change in your medical condition     (cold, fever, infections).       Do not wear jewelry, make-up, hairpins, clips or nail polish.  Do not wear lotions, powders, or perfumes. You may wear deodorant.  Do not shave 48 hours prior to surgery. Men may shave face and neck.  Do not bring valuables to the hospital.    [facility] is not responsible for any belongings or valuables.               Contacts, dentures or bridgework may not be worn into surgery.  Leave your suitcase in the car. After surgery it may be brought to your room.  For patients admitted to the hospital, discharge time is determined by your                treatment team.   Patients discharged the day of surgery will not be allowed to drive home.   Please read over the following fact sheets that you were given:   Surgical Site Infection Prevention/MRSA   ____ Take these medicines the morning of surgery with A SIP OF WATER:    1. NONE UNLESS INSTRUCTED BY DR [facility] 2.   3.   4.  5.  6.  ____ Fleet Enema (as directed)   __X__ Use CHG Soap as directed  ____ Use inhalers on the day of surgery  ____ Stop metformin 2 days prior to surgery    ____ Take 1/2 of usual insulin dose the night  before surgery and none on the morning of surgery.   ____ Stop Coumadin/Plavix/aspirin on   ____ Stop Anti-inflammatories on    __X__ Stop supplements until after surgery.  ALREADY STOPPED  ____ Bring C-Pap to the hospital.

## 2016-02-24 NOTE — Pre-Procedure Instructions (Signed)
Faxed notice to Dr. Jamal Collin that H&P dated 02/17/16 needs heart and lung assessment.

## 2016-02-26 ENCOUNTER — Encounter: Payer: Self-pay | Admitting: Emergency Medicine

## 2016-02-26 ENCOUNTER — Inpatient Hospital Stay
Admission: RE | Admit: 2016-02-26 | Discharge: 2016-02-29 | DRG: 330 | Disposition: A | Discharge status: Home / Self Care | Payer: BLUE CROSS/BLUE SHIELD | Source: Ambulatory Visit | Attending: General Surgery | Admitting: General Surgery

## 2016-02-26 ENCOUNTER — Inpatient Hospital Stay: Payer: BLUE CROSS/BLUE SHIELD | Admitting: Certified Registered Nurse Anesthetist

## 2016-02-26 ENCOUNTER — Encounter
Admission: RE | Disposition: A | Discharge status: Home / Self Care | Payer: Self-pay | Source: Ambulatory Visit | Attending: General Surgery

## 2016-02-26 DIAGNOSIS — N400 Enlarged prostate without lower urinary tract symptoms: Secondary | ICD-10-CM | POA: Diagnosis present

## 2016-02-26 DIAGNOSIS — Z880 Allergy status to penicillin: Secondary | ICD-10-CM | POA: Diagnosis not present

## 2016-02-26 DIAGNOSIS — D509 Iron deficiency anemia, unspecified: Secondary | ICD-10-CM | POA: Diagnosis present

## 2016-02-26 DIAGNOSIS — C772 Secondary and unspecified malignant neoplasm of intra-abdominal lymph nodes: Secondary | ICD-10-CM | POA: Diagnosis present

## 2016-02-26 DIAGNOSIS — Z841 Family history of disorders of kidney and ureter: Secondary | ICD-10-CM | POA: Diagnosis not present

## 2016-02-26 DIAGNOSIS — C182 Malignant neoplasm of ascending colon: Principal | ICD-10-CM | POA: Diagnosis present

## 2016-02-26 DIAGNOSIS — K219 Gastro-esophageal reflux disease without esophagitis: Secondary | ICD-10-CM | POA: Diagnosis present

## 2016-02-26 DIAGNOSIS — Z809 Family history of malignant neoplasm, unspecified: Secondary | ICD-10-CM | POA: Diagnosis not present

## 2016-02-26 DIAGNOSIS — Z9049 Acquired absence of other specified parts of digestive tract: Secondary | ICD-10-CM

## 2016-02-26 HISTORY — DX: Acquired absence of other specified parts of digestive tract: Z90.49

## 2016-02-26 HISTORY — PX: LAPAROSCOPIC RIGHT COLECTOMY: SHX5925

## 2016-02-26 HISTORY — DX: Anemia, unspecified: D64.9

## 2016-02-26 LAB — CBC
HEMATOCRIT: 29.1 % — AB (ref 40.0–52.0)
Hemoglobin: 8.9 g/dL — ABNORMAL LOW (ref 13.0–18.0)
MCH: 18.8 pg — ABNORMAL LOW (ref 26.0–34.0)
MCHC: 30.7 g/dL — ABNORMAL LOW (ref 32.0–36.0)
MCV: 61.3 fL — AB (ref 80.0–100.0)
Platelets: 346 10*3/uL (ref 150–440)
RBC: 4.75 MIL/uL (ref 4.40–5.90)
RDW: 18.8 % — AB (ref 11.5–14.5)
WBC: 18.7 10*3/uL — AB (ref 3.8–10.6)

## 2016-02-26 LAB — CREATININE, SERUM
Creatinine, Ser: 0.9 mg/dL (ref 0.61–1.24)
GFR calc non Af Amer: >60 mL/min (ref >60)

## 2016-02-26 SURGERY — COLECTOMY, RIGHT, LAPAROSCOPIC
Anesthesia: General | Laterality: Right | Wound class: Clean Contaminated

## 2016-02-26 MED ORDER — ONDANSETRON 8 MG PO TBDP: 4.0000 mg | ORAL_TABLET | Freq: Four times a day (QID) | ORAL | Status: DC | PRN

## 2016-02-26 MED ORDER — ACETAMINOPHEN 650 MG RE SUPP: 650.0000 mg | Freq: Four times a day (QID) | RECTAL | Status: DC | PRN

## 2016-02-26 MED ORDER — ENOXAPARIN SODIUM 40 MG/0.4ML ~~LOC~~ SOLN
40.0000 mg | SUBCUTANEOUS | Status: DC
  Administered 2016-02-26 – 2016-02-28 (×3): 40 mg via SUBCUTANEOUS
  Filled 2016-02-26 (×3): qty 0.4

## 2016-02-26 MED ORDER — ONDANSETRON HCL 4 MG/2ML IJ SOLN
INTRAMUSCULAR | Status: DC | PRN
  Administered 2016-02-26: 4 mg via INTRAVENOUS

## 2016-02-26 MED ORDER — ALVIMOPAN 12 MG PO CAPS
12.0000 mg | ORAL_CAPSULE | Freq: Once | ORAL | Status: AC
  Administered 2016-02-26: 12 mg via ORAL

## 2016-02-26 MED ORDER — FENTANYL CITRATE (PF) 100 MCG/2ML IJ SOLN
INTRAMUSCULAR | Status: AC
  Filled 2016-02-26: qty 2

## 2016-02-26 MED ORDER — FENTANYL CITRATE (PF) 100 MCG/2ML IJ SOLN
25.0000 ug | INTRAMUSCULAR | Status: DC | PRN
  Administered 2016-02-26 (×4): 25 ug via INTRAVENOUS

## 2016-02-26 MED ORDER — ONDANSETRON HCL 4 MG/2ML IJ SOLN: 4.0000 mg | Freq: Four times a day (QID) | INTRAMUSCULAR | Status: DC | PRN

## 2016-02-26 MED ORDER — SUGAMMADEX SODIUM 500 MG/5ML IV SOLN
INTRAVENOUS | Status: DC | PRN
  Administered 2016-02-26: 200 mg via INTRAVENOUS

## 2016-02-26 MED ORDER — KETOROLAC TROMETHAMINE 30 MG/ML IJ SOLN
30.0000 mg | Freq: Four times a day (QID) | INTRAMUSCULAR | Status: DC
  Administered 2016-02-26 – 2016-02-29 (×12): 30 mg via INTRAVENOUS
  Filled 2016-02-26 (×12): qty 1

## 2016-02-26 MED ORDER — ALVIMOPAN 12 MG PO CAPS
12.0000 mg | ORAL_CAPSULE | Freq: Two times a day (BID) | ORAL | Status: DC
  Administered 2016-02-27 – 2016-02-28 (×3): 12 mg via ORAL
  Filled 2016-02-26 (×4): qty 1

## 2016-02-26 MED ORDER — ROCURONIUM BROMIDE 100 MG/10ML IV SOLN
INTRAVENOUS | Status: DC | PRN
  Administered 2016-02-26: 25 mg via INTRAVENOUS
  Administered 2016-02-26: 10 mg via INTRAVENOUS
  Administered 2016-02-26: 20 mg via INTRAVENOUS
  Administered 2016-02-26: 5 mg via INTRAVENOUS

## 2016-02-26 MED ORDER — LACTATED RINGERS IV SOLN
INTRAVENOUS | Status: DC
  Administered 2016-02-26: 07:00:00 via INTRAVENOUS

## 2016-02-26 MED ORDER — FAMOTIDINE 20 MG PO TABS
20.0000 mg | ORAL_TABLET | Freq: Once | ORAL | Status: AC
  Administered 2016-02-26: 20 mg via ORAL

## 2016-02-26 MED ORDER — PROPOFOL 10 MG/ML IV BOLUS
INTRAVENOUS | Status: DC | PRN
  Administered 2016-02-26: 200 mg via INTRAVENOUS

## 2016-02-26 MED ORDER — MORPHINE SULFATE (PF) 2 MG/ML IV SOLN
2.0000 mg | INTRAVENOUS | Status: DC | PRN
Start: 2016-02-26 — End: 2016-02-29

## 2016-02-26 MED ORDER — CHLORHEXIDINE GLUCONATE 4 % EX LIQD: 1.0000 "application " | Freq: Once | CUTANEOUS | Status: DC

## 2016-02-26 MED ORDER — DEXTROSE-NACL 5-0.45 % IV SOLN
INTRAVENOUS | Status: DC
  Administered 2016-02-26 – 2016-02-28 (×6): via INTRAVENOUS

## 2016-02-26 MED ORDER — MIDAZOLAM HCL 2 MG/2ML IJ SOLN
INTRAMUSCULAR | Status: DC | PRN
  Administered 2016-02-26: 2 mg via INTRAVENOUS

## 2016-02-26 MED ORDER — ACETAMINOPHEN 325 MG PO TABS: 650.0000 mg | ORAL_TABLET | Freq: Four times a day (QID) | ORAL | Status: DC | PRN

## 2016-02-26 MED ORDER — PHENYLEPHRINE HCL 10 MG/ML IJ SOLN
INTRAMUSCULAR | Status: DC | PRN
  Administered 2016-02-26 (×2): 100 ug via INTRAVENOUS

## 2016-02-26 MED ORDER — KETOROLAC TROMETHAMINE 30 MG/ML IJ SOLN
INTRAMUSCULAR | Status: DC | PRN
  Administered 2016-02-26: 30 mg via INTRAVENOUS

## 2016-02-26 MED ORDER — ACETAMINOPHEN 10 MG/ML IV SOLN
INTRAVENOUS | Status: AC
  Filled 2016-02-26: qty 100

## 2016-02-26 MED ORDER — LIDOCAINE HCL (CARDIAC) 20 MG/ML IV SOLN
INTRAVENOUS | Status: DC | PRN
  Administered 2016-02-26: 50 mg via INTRAVENOUS

## 2016-02-26 MED ORDER — DEXAMETHASONE SODIUM PHOSPHATE 10 MG/ML IJ SOLN
INTRAMUSCULAR | Status: DC | PRN
  Administered 2016-02-26: 10 mg via INTRAVENOUS

## 2016-02-26 MED ORDER — SUCCINYLCHOLINE CHLORIDE 20 MG/ML IJ SOLN
INTRAMUSCULAR | Status: DC | PRN
  Administered 2016-02-26: 140 mg via INTRAVENOUS

## 2016-02-26 MED ORDER — ALVIMOPAN 12 MG PO CAPS
ORAL_CAPSULE | ORAL | Status: AC
  Administered 2016-02-26: 12 mg via ORAL
  Filled 2016-02-26: qty 1

## 2016-02-26 MED ORDER — FAMOTIDINE 20 MG PO TABS
ORAL_TABLET | ORAL | Status: AC
  Administered 2016-02-26: 20 mg via ORAL
  Filled 2016-02-26: qty 1

## 2016-02-26 MED ORDER — ONDANSETRON HCL 4 MG/2ML IJ SOLN: 4.0000 mg | Freq: Once | INTRAMUSCULAR | Status: DC | PRN

## 2016-02-26 MED ORDER — ACETAMINOPHEN 10 MG/ML IV SOLN
INTRAVENOUS | Status: DC | PRN
  Administered 2016-02-26: 1000 mg via INTRAVENOUS

## 2016-02-26 MED ORDER — FENTANYL CITRATE (PF) 100 MCG/2ML IJ SOLN
INTRAMUSCULAR | Status: DC | PRN
Start: 2016-02-26 — End: 2016-02-26
  Administered 2016-02-26 (×2): 50 ug via INTRAVENOUS
  Administered 2016-02-26: 100 ug via INTRAVENOUS
  Administered 2016-02-26: 50 ug via INTRAVENOUS

## 2016-02-26 MED ORDER — SODIUM CHLORIDE 0.9 % IV SOLN
1.0000 g | INTRAVENOUS | Status: AC
  Administered 2016-02-26: 1 g via INTRAVENOUS
  Filled 2016-02-26: qty 1

## 2016-02-26 MED ORDER — PANTOPRAZOLE SODIUM 40 MG PO TBEC
40.0000 mg | DELAYED_RELEASE_TABLET | Freq: Every day | ORAL | Status: DC
  Administered 2016-02-27 – 2016-02-29 (×3): 40 mg via ORAL
  Filled 2016-02-26 (×3): qty 1

## 2016-02-26 MED ORDER — OXYCODONE-ACETAMINOPHEN 5-325 MG PO TABS
1.0000 | ORAL_TABLET | ORAL | Status: DC | PRN
  Administered 2016-02-26 – 2016-02-27 (×2): 2 via ORAL
  Filled 2016-02-26 (×2): qty 2

## 2016-02-26 SURGICAL SUPPLY — 82 items
APPLIER CLIP ROT 10 11.4 M/L (STAPLE)
BLADE SURG 11 STRL SS SAFETY (MISCELLANEOUS) ×3 IMPLANT
CANISTER SUCT 1200ML W/VALVE (MISCELLANEOUS) ×3 IMPLANT
CANNULA DILATOR 10 W/SLV (CANNULA) ×2 IMPLANT
CANNULA DILATOR 10MM W/SLV (CANNULA) ×1
CATH FOL LEG HOLDER (MISCELLANEOUS) ×3 IMPLANT
CATH TRAY 16F METER LATEX (MISCELLANEOUS) ×3 IMPLANT
CHLORAPREP W/TINT 26ML (MISCELLANEOUS) ×3 IMPLANT
CLEANER CAUTERY TIP 5X5 PAD (MISCELLANEOUS) IMPLANT
CLIP APPLIE ROT 10 11.4 M/L (STAPLE) IMPLANT
CLOSURE WOUND 1/2 X4 (GAUZE/BANDAGES/DRESSINGS) ×2
COVER CLAMP SIL LG PBX B (MISCELLANEOUS) IMPLANT
DEFOGGER SCOPE WARMER CLEARIFY (MISCELLANEOUS) ×3 IMPLANT
DEVICE HAND ACCESS DEXTUS (MISCELLANEOUS) ×1 IMPLANT
DRAPE INCISE IOBAN 66X45 STRL (DRAPES) ×3 IMPLANT
DRAPE LEGGINS SURG 28X43 STRL (DRAPES) IMPLANT
DRAPE UNDER BUTTOCK W/FLU (DRAPES) IMPLANT
DRSG OPSITE POSTOP 4X10 (GAUZE/BANDAGES/DRESSINGS) IMPLANT
DRSG OPSITE POSTOP 4X8 (GAUZE/BANDAGES/DRESSINGS) ×3 IMPLANT
DRSG TEGADERM 2-3/8X2-3/4 SM (GAUZE/BANDAGES/DRESSINGS) ×6 IMPLANT
DRSG TEGADERM 4X4.75 (GAUZE/BANDAGES/DRESSINGS) IMPLANT
DRSG TELFA 3X8 NADH (GAUZE/BANDAGES/DRESSINGS) ×6 IMPLANT
ELECT BLADE 6.5 EXT (BLADE) ×3 IMPLANT
ELECT REM PT RETURN 9FT ADLT (ELECTROSURGICAL) ×3
ELECTRODE REM PT RTRN 9FT ADLT (ELECTROSURGICAL) ×1 IMPLANT
FILTER LAP SMOKE EVAC STRL (MISCELLANEOUS) IMPLANT
GLOVE BIO SURGEON STRL SZ7 (GLOVE) ×45 IMPLANT
GOWN STRL REUS W/ TWL LRG LVL3 (GOWN DISPOSABLE) ×8 IMPLANT
GOWN STRL REUS W/TWL LRG LVL3 (GOWN DISPOSABLE) ×16
HANDLE YANKAUER SUCT BULB TIP (MISCELLANEOUS) ×3 IMPLANT
IRRIGATION STRYKERFLOW (MISCELLANEOUS) ×1 IMPLANT
IRRIGATOR STRYKERFLOW (MISCELLANEOUS) ×3
IV LACTATED RINGERS 1000ML (IV SOLUTION) ×3 IMPLANT
KIT PINK PAD W/HEAD ARE REST (MISCELLANEOUS) ×3
KIT PINK PAD W/HEAD ARM REST (MISCELLANEOUS) ×1 IMPLANT
KIT RM TURNOVER STRD PROC AR (KITS) ×3 IMPLANT
LABEL OR SOLS (LABEL) IMPLANT
NS IRRIG 500ML POUR BTL (IV SOLUTION) ×3 IMPLANT
PACK COLON CLEAN CLOSURE (MISCELLANEOUS) ×3 IMPLANT
PACK LAP CHOLECYSTECTOMY (MISCELLANEOUS) ×3 IMPLANT
PAD CLEANER CAUTERY TIP 5X5 (MISCELLANEOUS)
PAD PREP 24X41 OB/GYN DISP (PERSONAL CARE ITEMS) IMPLANT
PENCIL ELECTRO HAND CTR (MISCELLANEOUS) ×3 IMPLANT
PROT DEXTUS HAND ACCESS (MISCELLANEOUS) ×3
RELOAD PROXIMATE 75MM BLUE (ENDOMECHANICALS) ×3 IMPLANT
RETRACTOR FIXED LENGTH SML (MISCELLANEOUS) ×3 IMPLANT
RETRACTOR WOUND ALXS 18CM MED (MISCELLANEOUS) IMPLANT
RETRACTOR WOUND ALXS 18CM SML (MISCELLANEOUS) IMPLANT
RTRCTR WOUND ALEXIS O 18CM MED (MISCELLANEOUS)
RTRCTR WOUND ALEXIS O 18CM SML (MISCELLANEOUS)
SCISSORS METZENBAUM CVD 33 (INSTRUMENTS) IMPLANT
SET YANKAUER POOLE SUCT (MISCELLANEOUS) ×3 IMPLANT
SHEARS HARMONIC ACE PLUS 36CM (ENDOMECHANICALS) ×3 IMPLANT
SLEEVE ENDOPATH XCEL 5M (ENDOMECHANICALS) ×3 IMPLANT
SPONGE LAP 18X18 5 PK (GAUZE/BANDAGES/DRESSINGS) ×3 IMPLANT
STAPLER PROXIMATE 75MM BLUE (STAPLE) ×3 IMPLANT
STAPLER SKIN PROX 35W (STAPLE) IMPLANT
STRIP CLOSURE SKIN 1/2X4 (GAUZE/BANDAGES/DRESSINGS) ×4 IMPLANT
SUT MNCRL 3-0 UNDYED SH (SUTURE) ×2 IMPLANT
SUT MNCRL 4-0 (SUTURE) ×4
SUT MNCRL 4-0 27XMFL (SUTURE) ×2
SUT MONOCRYL 3-0 UNDYED (SUTURE) ×4
SUT PROLENE 0 CT 1 30 (SUTURE) ×12 IMPLANT
SUT SILK 2 0 (SUTURE) ×2
SUT SILK 2-0 18XBRD TIE 12 (SUTURE) ×1 IMPLANT
SUT SILK 3-0 (SUTURE) ×3 IMPLANT
SUT VIC AB 0 CT1 36 (SUTURE) IMPLANT
SUT VIC AB 2-0 BRD 54 (SUTURE) IMPLANT
SUT VIC AB 2-0 CT1 27 (SUTURE)
SUT VIC AB 2-0 CT1 TAPERPNT 27 (SUTURE) IMPLANT
SUT VIC AB 3-0 54X BRD REEL (SUTURE) ×1 IMPLANT
SUT VIC AB 3-0 BRD 54 (SUTURE) ×2
SUT VIC AB 3-0 SH 27 (SUTURE) ×2
SUT VIC AB 3-0 SH 27X BRD (SUTURE) ×1 IMPLANT
SUT VIC AB 4-0 FS2 27 (SUTURE) ×3 IMPLANT
SUTURE MNCRL 4-0 27XMF (SUTURE) ×2 IMPLANT
SWABSTK COMLB BENZOIN TINCTURE (MISCELLANEOUS) ×3 IMPLANT
SYR BULB IRRIG 60ML STRL (SYRINGE) ×3 IMPLANT
TROCAR XCEL NON-BLD 11X100MML (ENDOMECHANICALS) ×3 IMPLANT
TROCAR XCEL NON-BLD 5MMX100MML (ENDOMECHANICALS) ×3 IMPLANT
TROCAR XCEL UNIV SLVE 11M 100M (ENDOMECHANICALS) ×6 IMPLANT
TUBING INSUFFLATOR HEATED (MISCELLANEOUS) ×3 IMPLANT

## 2016-02-26 NOTE — Op Note (Signed)
Preop diagnosis: Cancer of the ascending colon  Post op diagnosis: Same  Operation: Laparoscopy and right hemicolectomy  Surgeon: Mckinley Jewel  Assistant: Arvilla Meres, RN, FA   Anesthesia: Gen.  Complications: None  EBL: Approximately 75 mL  Drains: None  Description: Patient was put to sleep in supine position the operating table Foley catheter was inserted. Abdomen was prepped and draped as sterile field and timeout performed. Small incision was made just above the umbilicus for the initial port placement. At this side a tiny fatty hernia was identified which was removed and through the small fascial defect a Veress needle was positioned in the peritoneal cavity and pneumoperitoneum was obtained followed by placement off the 12 mm port. Camera was introduced and the epigastric and left upper quadrant 11 mm ports were placed. Initial dissection was done with the laparoscope alone and the secondary part was done with a hand assist. It was noted that the cecal area itself was free but just above this was where the cancer was in the ascending colon. This portion appear to be somewhat puckered down but was also adherent to the lateral abdominal wall. No evidence of metastatic disease in the peritoneal cavity or liver was noted. With use of a Harmonic scalpel the peritoneum surrounding the adhesion of the cancer to the lateral abdominal wall was taken down. The lateral gutter was and opened below this level and above this level and the hepatic flexure was dissected also with the use of Harmonic scalpel. The omentum was freed from the proximal transverse colon using this harmonic device also. Since the adhesion was the highest fairly significant in the lateral abdominal wall at this point the hand port was planned. The umbilical port was removed and the incision was extended to about the 6-7 cm slightly encircling the umbilicus but predominantly he going up superiorly a small hand port was then  position and with a hand in placing and pneumoperitoneum further dissection was completed the area of adhesion was carefully felt and peeled and using the harmonic device was completely freed incorporating the layers of the peritoneum with the specimen right colon was adequately mobilized, well into the midline and beyond. This point the additional ports were removed and the hand port device was used as a shield for the abdominal wall. The terminal ileum appeared to be somewhat adherent in one place which subsequently was freed. The right colon was brought up through this incision and the lines of resection the terminal ileum and proximal transverse colon were mapped out. Mesentery was taken down with the use of harmonic device until the main ileal colic vessel was identified this was clamped cut and doubly ligated with 0 silk. The terminal ileum and transverse colon was then brought together with some silk stitches and a GIA-75 stapler was then utilized to complete the side-to-side anastomosis. A second application was used to transect the bowel and at this site. The anastomotic lumen was approximately 2 fingerbreadths. Staple line was noted be intact and the corners and the middle area were reinforced with 3-0 silk stitches. Following this the mesentery was closed with a running 3-0 Vicryl. A liter of fluid was used to irrigate out the abdominal cavity. At this point Dr. Bary Leriche and gloves were changed and the abdominal area and redraped. The abdominal incision was then closed with the 0 proline incorporating the fascia and the peritoneum with interrupted figure-of-eight configuration. The skin incision was closed with subcuticular 4-0 Monocryl. Steri-Strips were applied over the  incision and dressing was with the honeycomb dressing with the main incision and Telfa and Tegaderm over the port sites. Patient tolerated the procedure well with no problems encountered and he was subsequently returned recovery room  stable condition

## 2016-02-26 NOTE — Interval H&P Note (Signed)
History and Physical Interval Note:  02/26/2016 7:04 AM  Philip Shah  has presented today for surgery, with the diagnosis of CANCER RIGHT COLON  The various methods of treatment have been discussed with the patient and family. After consideration of risks, benefits and other options for treatment, the patient has consented to  Procedure(s): LAPAROSCOPIC RIGHT COLECTOMY (Right) as a surgical intervention .  The patient's history has been reviewed, patient examined, no change in status, stable for surgery.  I have reviewed the patient's chart and labs.  Questions were answered to the patient's satisfaction.     SANKAR,SEEPLAPUTHUR G

## 2016-02-26 NOTE — H&P (View-Only) (Signed)
Patient ID: Philip Shah, male   DOB: [birth year], 60 y.o.   MRN: [id number]  Chief Complaint  Patient presents with  . Follow-up    CT scan    HPI Philip Shah is a 60 y.o. male.  Here today for follow up from colonoscopy,, labs and CT scan. 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    He is here [place] with his brother, Philip Shah. I have reviewed the history of present illness with the patient.    HPI  Past Medical History  Diagnosis Date  . Enlarged prostate   . GERD (gastroesophageal reflux disease)   . Hemorrhoids   . Sciatica     Past Surgical History  Procedure Laterality Date  . Vein ligation Right 1980's    Dr Pat Kocher  . Vasectomy    . Colonoscopy with propofol N/A 02/11/2016    Procedure: COLONOSCOPY WITH PROPOFOL;  Surgeon: Christene Lye, MD;  Location: [facility] ENDOSCOPY;  Service: Endoscopy;  Laterality: N/A;  . Esophagogastroduodenoscopy (egd) with propofol N/A 02/11/2016    Procedure: ESOPHAGOGASTRODUODENOSCOPY (EGD) WITH PROPOFOL;  Surgeon: Christene Lye, MD;  Location: [facility] END[facility];  Service: Endoscopy;  Laterality: N/A;    Family History  Problem Relation Age of Onset  . Cancer Father     spine  . Kidney failure Mother     Social History Social History  Substance Use Topics  . Smoking status: Never Smoker   . Smokeless tobacco: Never Used  . Alcohol Use: 0.0 oz/week    0 Standard drinks or equivalent per week     Comment: 3/day    Allergies  Allergen Reactions  . Penicillins     Current Outpatient Prescriptions  Medication Sig Dispense Refill  . Omega-3 Fatty Acids (FISH OIL EXTRA STRENGTH PO) Take by mouth every other day.    . Saw Palmetto 80 MG CAPS Take by mouth every other day.    Derrill Memo ON 02/25/2016] metroNIDAZOLE (FLAGYL) 500 MG tablet Take one tablet (1) at 6 pm and one tablet (1) at 11 pm the evening prior to surgery. 2 tablet 0  . [START ON 02/25/2016] neomycin  (MYCIFRADIN) 500 MG tablet Take two tablets (2) at 6 pm and two tablets (2) at 11 pm the evening prior to surgery. 4 tablet 0  . polyethylene glycol powder (GLYCOLAX/MIRALAX) powder 255 grams one bottle for colonoscopy prep 255 g 0   No current facility-administered medications for this visit.    Review of Systems Review of Systems  Constitutional: Negative.   Respiratory: Negative.   Cardiovascular: Negative.     Blood pressure 156/82, pulse 74, resp. rate 14, height 5\' 6"  (1.676 m), weight 201 lb (91.173 kg).  Physical Exam Physical Exam  Constitutional: He is oriented to person, place, and time. He appears well-developed and well-nourished.  Eyes: Conjunctivae are normal. No scleral icterus.  Neck: Neck supple.  Cardiovascular: Normal rate, regular rhythm and normal heart sounds.   Pulmonary/Chest: Effort normal and breath sounds normal.  Abdominal: Soft. Bowel sounds are normal. He exhibits no distension and no mass. There is no tenderness.  Lymphadenopathy:    He has no cervical adenopathy.  Neurological: He is alert and oriented to person, place, and time.  Skin: Skin is warm and dry.  Psychiatric: His behavior is normal.    Data Reviewed CT scan and prior notesMass is localized to ascending colon and has not appeared to have spread to liver or other surrounding organs. Looks to  be locally advanced, but will not know about lymph node involvement until procedure. Laparoscopic approach will be used for removal of mass.  CEA was WNL.  Hgb is up to 9.8,     Assessment    Mass of the ascending colon  Iron deficiency anemia    Plan   Explained surgery, risks and benefits. Pt is agreeable and wishes to proceed  Will continue to monitor anemia by CBC.  No transfusion planned at this time, as long as Hb remains stable.   Laparoscopic removal of mass and bowel anastomosis of ilium and colon.Will schedule colectomy for next week.   Will continue to monitor CEA  post-procedure.     Patient's surgery has been scheduled for 02-26-16 at [facility]. This patient will be asked to complete a bowel prep with antibiotics. Instructions were reviewed today.      PCP:  Nicky Pugh This information has been scribed by Karie Fetch RN, BSN,BC.    SANKAR,SEEPLAPUTHUR G 02/19/2016, 1:35 PM

## 2016-02-26 NOTE — Anesthesia Preprocedure Evaluation (Signed)
Anesthesia Evaluation  Patient identified by MRN, date of birth, ID band Patient awake    Reviewed: Allergy & Precautions, H&P , NPO status , Patient's Chart, lab work & pertinent test results, reviewed documented beta blocker date and time   Airway Mallampati: III   Neck ROM: full    Dental  (+) Teeth Intact   Pulmonary neg pulmonary ROS,    Pulmonary exam normal        Cardiovascular Exercise Tolerance: Good negative cardio ROS Normal cardiovascular exam Rhythm:regular Rate:Normal     Neuro/Psych  Neuromuscular disease negative neurological ROS  negative psych ROS   GI/Hepatic negative GI ROS, Neg liver ROS, GERD  Medicated,  Endo/Other  negative endocrine ROS  Renal/GU negative Renal ROS  negative genitourinary   Musculoskeletal   Abdominal   Peds  Hematology negative hematology ROS (+) anemia ,   Anesthesia Other Findings Past Medical History:   Enlarged prostate                                            GERD (gastroesophageal reflux disease)                       Hemorrhoids                                                  Sciatica                                                     Anemia                                                     Past Surgical History:   VEIN LIGATION                                   Right 1980's         Comment:Dr Pat Kocher   VASECTOMY                                                     COLONOSCOPY WITH PROPOFOL                       N/A 02/11/2016       Comment:Procedure: COLONOSCOPY WITH PROPOFOL;  Surgeon:              Christene Lye, MD;  Location: [facility]               ENDOSCOPY;  Service: Endoscopy;  Laterality:               N/A;   ESOPHAGOGASTRODUODENOSCOPY (EGD) WITH PROPOFOL  N/A 02/11/2016  Comment:Procedure: ESOPHAGOGASTRODUODENOSCOPY (EGD)               WITH PROPOFOL;  Surgeon: Christene Lye,              MD;  Location: [facility] ENDOSCOPY;   Service:               Endoscopy;  Laterality: N/A; BMI    Body Mass Index   32.45 kg/m 2     Reproductive/Obstetrics                             Anesthesia Physical Anesthesia Plan  ASA: II  Anesthesia Plan: General   Post-op Pain Management:    Induction: Intravenous  Airway Management Planned: Video Laryngoscope Planned  Additional Equipment:   Intra-op Plan:   Post-operative Plan:   Informed Consent: I have reviewed the patients History and Physical, chart, labs and discussed the procedure including the risks, benefits and alternatives for the proposed anesthesia with the patient or authorized representative who has indicated his/her understanding and acceptance.   Dental Advisory Given  Plan Discussed with: CRNA  Anesthesia Plan Comments:         Anesthesia Quick Evaluation

## 2016-02-26 NOTE — Anesthesia Procedure Notes (Signed)
Procedure Name: Intubation Date/Time: 02/26/2016 7:34 AM Performed by: Johnna Acosta Pre-anesthesia Checklist: Patient identified, Emergency Drugs available, Suction available, Patient being monitored and Timeout performed Patient Re-evaluated:Patient Re-evaluated prior to inductionOxygen Delivery Method: Circle system utilized Preoxygenation: Pre-oxygenation with 100% oxygen Intubation Type: IV induction Ventilation: Mask ventilation with difficulty and Oral airway inserted - appropriate to patient size Laryngoscope Size: Glidescope and 3 Grade View: Grade I Tube type: Oral Tube size: 7.5 mm Number of attempts: 2 Airway Equipment and Method: Stylet Placement Confirmation: ETT inserted through vocal cords under direct vision,  positive ETCO2 and breath sounds checked- equal and bilateral Secured at: 22 cm Tube secured with: Tape Dental Injury: Teeth and Oropharynx as per pre-operative assessment  Difficulty Due To: Difficulty was anticipated, Difficult Airway- due to large tongue and Difficult Airway- due to limited oral opening Future Recommendations: Recommend- induction with short-acting agent, and alternative techniques readily available

## 2016-02-26 NOTE — Transfer of Care (Signed)
Immediate Anesthesia Transfer of Care Note  Patient: Philip Shah  Procedure(s) Performed: Procedure(s): LAPAROSCOPIC RIGHT COLECTOMY (Right)  Patient Location: PACU  Anesthesia Type:General  Level of Consciousness: sedated  Airway & Oxygen Therapy: Patient Spontanous Breathing and Patient connected to face mask oxygen  Post-op Assessment: Report given to RN and Post -op Vital signs reviewed and stable  Post vital signs: Reviewed and stable  Last Vitals:  Filed Vitals:   02/26/16 0630 02/26/16 1019  BP: 145/77 121/64  Pulse: 80 85  Temp: 36.9 C 36.6 C  Resp: 20 16    Last Pain:  Filed Vitals:   02/26/16 1022  PainSc: Asleep         Complications: No apparent anesthesia complications

## 2016-02-27 LAB — CBC
HCT: 26.4 % — ABNORMAL LOW (ref 40.0–52.0)
Hemoglobin: 8.1 g/dL — ABNORMAL LOW (ref 13.0–18.0)
MCH: 18.8 pg — AB (ref 26.0–34.0)
MCHC: 30.8 g/dL — ABNORMAL LOW (ref 32.0–36.0)
MCV: 60.9 fL — ABNORMAL LOW (ref 80.0–100.0)
PLATELETS: 346 10*3/uL (ref 150–440)
RBC: 4.34 MIL/uL — AB (ref 4.40–5.90)
RDW: 18.8 % — ABNORMAL HIGH (ref 11.5–14.5)
WBC: 14.5 10*3/uL — AB (ref 3.8–10.6)

## 2016-02-27 LAB — BASIC METABOLIC PANEL WITH GFR
Anion gap: 5 (ref 5–15)
BUN: 12 mg/dL (ref 6–20)
CO2: 24 mmol/L (ref 22–32)
Calcium: 7.8 mg/dL — ABNORMAL LOW (ref 8.9–10.3)
Chloride: 105 mmol/L (ref 101–111)
Creatinine, Ser: 0.75 mg/dL (ref 0.61–1.24)
GFR calc Af Amer: >60 mL/min
GFR calc non Af Amer: >60 mL/min
Glucose, Bld: 136 mg/dL — ABNORMAL HIGH (ref 65–99)
Potassium: 4.2 mmol/L (ref 3.5–5.1)
Sodium: 134 mmol/L — ABNORMAL LOW (ref 135–145)

## 2016-02-27 NOTE — Anesthesia Postprocedure Evaluation (Signed)
Anesthesia Post Note  Patient: Philip Shah  Procedure(s) Performed: Procedure(s) (LRB): LAPAROSCOPIC RIGHT COLECTOMY (Right)  Patient location during evaluation: PACU Anesthesia Type: General Level of consciousness: awake and alert Pain management: pain level controlled Vital Signs Assessment: post-procedure vital signs reviewed and stable Respiratory status: spontaneous breathing, nonlabored ventilation, respiratory function stable and patient connected to nasal cannula oxygen Cardiovascular status: blood pressure returned to baseline and stable Postop Assessment: no signs of nausea or vomiting Anesthetic complications: no    Last Vitals:  Filed Vitals:   02/27/16 0410 02/27/16 1243  BP: 111/64 124/66  Pulse: 75 76  Temp: 36.4 C 36.5 C  Resp: 20 20    Last Pain:  Filed Vitals:   02/27/16 1244  PainSc: 2                  Molli Barrows

## 2016-02-27 NOTE — Plan of Care (Signed)
Problem: Respiratory: Goal: Ability to achieve and maintain a regular respiratory rate will improve Outcome: Progressing Pt doing Incentive Spirometry as ordered

## 2016-02-27 NOTE — Progress Notes (Signed)
Patient ID: Philip Shah, male   DOB: [birth year], 60 y.o.   MRN: [id number] No complaints. No n/v. Passing flatus. AVSS. Abdomen is soft, active bowel sounds. Incision looks clean and intact. Lungs clear. Good u/o. Labs ok-hgb 8.1. Stable course. Clear liqs po

## 2016-02-28 LAB — CBC WITH DIFFERENTIAL/PLATELET
BASOS ABS: 0.1 10*3/uL (ref 0–0.1)
Basophils Relative: 1 %
EOS PCT: 2 %
Eosinophils Absolute: 0.2 10*3/uL (ref 0–0.7)
HCT: 25.5 % — ABNORMAL LOW (ref 40.0–52.0)
Hemoglobin: 7.7 g/dL — ABNORMAL LOW (ref 13.0–18.0)
LYMPHS PCT: 19 %
Lymphs Abs: 2 10*3/uL (ref 1.0–3.6)
MCH: 18.8 pg — ABNORMAL LOW (ref 26.0–34.0)
MCHC: 30.2 g/dL — AB (ref 32.0–36.0)
MCV: 62.4 fL — AB (ref 80.0–100.0)
MONO ABS: 0.8 10*3/uL (ref 0.2–1.0)
MONOS PCT: 8 %
Neutro Abs: 7.3 10*3/uL — ABNORMAL HIGH (ref 1.4–6.5)
Neutrophils Relative %: 70 %
PLATELETS: 277 10*3/uL (ref 150–440)
RBC: 4.08 MIL/uL — ABNORMAL LOW (ref 4.40–5.90)
RDW: 18.9 % — AB (ref 11.5–14.5)
WBC: 10.4 10*3/uL (ref 3.8–10.6)

## 2016-02-28 NOTE — Progress Notes (Signed)
Patient ID: Philip Shah, male   DOB: [birth year], 60 y.o.   MRN: [id number] No complaints.. Has had 2  Bm. Tolerating po well. Good u/o. AVSS. Abdomen is soft, active bowel sounds. Incision clean and intact. Lungs clear. Hgb 7.7-was 8.1 yesterday. Very good progress. Advance diet, d/c foley. If stable will discharge tomorrow am.  Pathology: 8cm CA, margins close -radial. 4 nodes involved out of 14. T3, N 2a, Stage IIIb. Discussed fully with pt and his brothers. Will need adjuvant chemo.  Will arrange oncology consult after discharge.

## 2016-02-29 MED ORDER — OXYCODONE-ACETAMINOPHEN 5-325 MG PO TABS: 1.0000 | ORAL_TABLET | ORAL | Status: DC | PRN

## 2016-02-29 MED ORDER — FERROUS SULFATE 325 (65 FE) MG PO TABS: 325.0000 mg | ORAL_TABLET | Freq: Every day | ORAL | Status: DC

## 2016-02-29 NOTE — Discharge Summary (Signed)
Physician Discharge Summary  Patient ID: SHAHN SCHEIBLE MRN: [id number] DOB/AGE: Dec 25, 1955 60 y.o.  Admit date: 02/26/2016 Discharge date: 02/29/2016  Admission Diagnoses:CA ascending colon  Discharge Diagnoses:  Active Problems:   Cancer of ascending colon [facility])   Discharged Condition: good  Hospital Course: This 60 year old male was recently found to be anemic with microcytic indices. 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 Subsequent CT scan was obtained showing a large lesion in the ascending colon with possible lymph node involvement. Also 2 subcentimeter foci were noted in the left lobe of the liver which was too small to characterize at this time. Given his anemia and and the fact that did appear that the lesion was fairly large surgical excision was recommended patient was agreeable. Patient underwent laparoscopy in the right hemicolectomy. No evidence of gross metastatic disease was identified at time of surgery including attention to the left lobe of the liver. The lesion did appear to be focally adherent to the lateral peritoneum in the right gutter and the sleeve of the peritoneum was removed along with the specimen. Postoperative course were basically uncomplicated the patient had the no problems with his bowel activity and the was quickly advanced to a solid diet on third postoperative day. His bowels are functioning. Has had no nausea or vomiting he has been afebrile and his vital signs are stable. He is being discharged now to be followed as an outpatient. Path report was reviewed that this is a stage IIIB lesion. Adjuvant chemotherapy would be indicated and this will be arranged with oncology consultation this outpatient.  Consults: None  Significant Diagnostic Studies: labs: Hemoglobin stable preoperative 8.9. Postop 7.7 likely from equilibration. Liver functions  are normal and CEA was normal.  Treatments: surgery: Laparoscopy and right hemicolectomy  Discharge Exam: Blood pressure 138/73, pulse 72, temperature 98.7 F (37.1 C), temperature source Oral, resp. rate 16, height 5\' 6"  (1.676 m), weight 201 lb (91.173 kg), SpO2 97 %. GI: soft, non-tender; bowel sounds normal; no masses,  no organomegaly incision and port sites are clean and healing satisfactorily. Lungs are clear to auscultation percussion.  Disposition: 01-Home or Self Care  Discharge Instructions    Call MD for:  persistant nausea and vomiting    Complete by:  As directed      Call MD for:  redness, tenderness, or signs of infection (pain, swelling, redness, odor or green/yellow discharge around incision site)    Complete by:  As directed      Call MD for:  severe uncontrolled pain    Complete by:  As directed      Call MD for:  temperature >100.4    Complete by:  As directed      Diet general    Complete by:  As directed      Discharge instructions    Complete by:  As directed   No exertional activity. May shower.            Medication List    TAKE these medications        ferrous sulfate 325 (65 FE) MG tablet  Commonly known as:  FERROUSUL  Take 1 tablet (325 mg total) by mouth daily with breakfast.     FLAX SEED OIL PO  Take 1 capsule by mouth daily.     multivitamin with minerals Tabs tablet  Take 1 tablet by mouth daily.  oxyCODONE-acetaminophen 5-325 MG tablet  Commonly known as:  PERCOCET/ROXICET  Take 1-2 tablets by mouth every 4 (four) hours as needed for moderate pain.     polyethylene glycol powder powder  Commonly known as:  GLYCOLAX/MIRALAX  255 grams one bottle for colonoscopy prep     Saw Palmetto 80 MG Caps  Take 80 mg by mouth daily.           Follow-up Information    Schedule an appointment as soon as possible for a visit on 03/11/2016 to follow up.   Why:  post op check      Signed: Rickell Wiehe G 02/29/2016, 9:26  AM

## 2016-02-29 NOTE — Progress Notes (Signed)
Pt to be discharged per MD order. Iv removed. Instructions reviewed with pt and family. All questions answered. Percocet script given to pt and ferrous sulfate was sent to pharmacy. Will take out in wheelchair

## 2016-03-03 ENCOUNTER — Telehealth: Payer: Self-pay | Admitting: *Deleted

## 2016-03-03 NOTE — Telephone Encounter (Signed)
Patient called wanting to see if he can get a prescription written for Percocet. He had surgery on 02/26/16. Patient knows that Dr.Sankar is out of town and I told patient that I would page him to see what he wants me to do. Patient declined that I page him because he doesn't really need them but he just wanted to have them on hand. I let patient choose what he wants me to do, which was not page him. I told patient to call back if anything changes that the doctor on call for the day can write the prescription after talking with Dr.Sankar.

## 2016-03-11 ENCOUNTER — Ambulatory Visit (INDEPENDENT_AMBULATORY_CARE_PROVIDER_SITE_OTHER): Payer: BLUE CROSS/BLUE SHIELD | Admitting: General Surgery

## 2016-03-11 ENCOUNTER — Encounter: Payer: Self-pay | Admitting: General Surgery

## 2016-03-11 VITALS — BP 148/78 | HR 88 | Resp 16 | Ht 66.0 in | Wt 200.0 lb

## 2016-03-11 DIAGNOSIS — C182 Malignant neoplasm of ascending colon: Secondary | ICD-10-CM

## 2016-03-11 NOTE — Progress Notes (Signed)
Patient ID: Philip Shah, male   DOB: 08-Aug-1956, 60 y.o.   MRN: [id number] Patient here following up from a laparoscopic right colectomy done on 02/26/16. He reports that he is doing well. He is using the bathroom routinely. He reports that he has had very little discomfort.  I have reviewed the history of present illness with the patient.   Has been taking iron pills for supplementation for anemia. Endorses color changes of stool, this is consistent with side effect of iron supplements.  Abdomen soft, non-distended, and non-tender. Bowel sounds normoactive.  Incision healing well.  Lung clear to auscultation. Heart regular rate and rhythm.   Path- [id number]. Stage IIIB at least. Two tiny indistinct liver lesions.  Referral made for oncology eval- Dr. Rogue Bussing at the [facility] for chemotherapy. Discussed port-a-cath placement for chemotherapy in outpatient setting.   Hgb/hct to be ordered.   Patient's surgery has been scheduled for 03-18-16 at [facility].  PCP: Brynda Greathouse, E This has been scribed by Lesly Rubenstein LPN

## 2016-03-12 ENCOUNTER — Telehealth: Payer: Self-pay

## 2016-03-12 LAB — HEMOGLOBIN AND HEMATOCRIT, BLOOD
HEMOGLOBIN: 9.2 g/dL — AB (ref 12.6–17.7)
Hematocrit: 31.4 % — ABNORMAL LOW (ref 37.5–51.0)

## 2016-03-12 NOTE — Telephone Encounter (Signed)
-----   Message from Christene Lye, MD sent at 03/12/2016  8:17 AM EDT ----- Inform pt Hgb is improved

## 2016-03-12 NOTE — Telephone Encounter (Signed)
Notified patient as instructed, patient pleased. Discussed follow-up appointments, patient agrees  

## 2016-03-15 ENCOUNTER — Inpatient Hospital Stay: Payer: BLUE CROSS/BLUE SHIELD | Attending: Internal Medicine | Admitting: Internal Medicine

## 2016-03-15 ENCOUNTER — Encounter: Payer: Self-pay | Admitting: Internal Medicine

## 2016-03-15 ENCOUNTER — Other Ambulatory Visit: Payer: BLUE CROSS/BLUE SHIELD

## 2016-03-15 ENCOUNTER — Inpatient Hospital Stay: Payer: BLUE CROSS/BLUE SHIELD

## 2016-03-15 VITALS — BP 150/89 | HR 84 | Temp 98.5°F | Resp 18 | Wt 200.0 lb

## 2016-03-15 DIAGNOSIS — R591 Generalized enlarged lymph nodes: Secondary | ICD-10-CM | POA: Insufficient documentation

## 2016-03-15 DIAGNOSIS — D500 Iron deficiency anemia secondary to blood loss (chronic): Secondary | ICD-10-CM

## 2016-03-15 DIAGNOSIS — I7090 Unspecified atherosclerosis: Secondary | ICD-10-CM | POA: Diagnosis not present

## 2016-03-15 DIAGNOSIS — K7689 Other specified diseases of liver: Secondary | ICD-10-CM

## 2016-03-15 DIAGNOSIS — K760 Fatty (change of) liver, not elsewhere classified: Secondary | ICD-10-CM | POA: Diagnosis not present

## 2016-03-15 DIAGNOSIS — K5900 Constipation, unspecified: Secondary | ICD-10-CM | POA: Diagnosis not present

## 2016-03-15 DIAGNOSIS — Z9049 Acquired absence of other specified parts of digestive tract: Secondary | ICD-10-CM | POA: Diagnosis not present

## 2016-03-15 DIAGNOSIS — C182 Malignant neoplasm of ascending colon: Secondary | ICD-10-CM | POA: Diagnosis not present

## 2016-03-15 DIAGNOSIS — K219 Gastro-esophageal reflux disease without esophagitis: Secondary | ICD-10-CM | POA: Diagnosis not present

## 2016-03-15 DIAGNOSIS — Z808 Family history of malignant neoplasm of other organs or systems: Secondary | ICD-10-CM | POA: Diagnosis not present

## 2016-03-15 DIAGNOSIS — M5430 Sciatica, unspecified side: Secondary | ICD-10-CM | POA: Diagnosis not present

## 2016-03-15 DIAGNOSIS — Z5111 Encounter for antineoplastic chemotherapy: Secondary | ICD-10-CM | POA: Diagnosis not present

## 2016-03-15 DIAGNOSIS — Z800 Family history of malignant neoplasm of digestive organs: Secondary | ICD-10-CM | POA: Diagnosis not present

## 2016-03-15 DIAGNOSIS — K649 Unspecified hemorrhoids: Secondary | ICD-10-CM | POA: Insufficient documentation

## 2016-03-15 DIAGNOSIS — R5383 Other fatigue: Secondary | ICD-10-CM | POA: Insufficient documentation

## 2016-03-15 DIAGNOSIS — D509 Iron deficiency anemia, unspecified: Secondary | ICD-10-CM | POA: Insufficient documentation

## 2016-03-15 DIAGNOSIS — N400 Enlarged prostate without lower urinary tract symptoms: Secondary | ICD-10-CM

## 2016-03-15 DIAGNOSIS — Z79899 Other long term (current) drug therapy: Secondary | ICD-10-CM | POA: Insufficient documentation

## 2016-03-15 DIAGNOSIS — K769 Liver disease, unspecified: Secondary | ICD-10-CM

## 2016-03-15 LAB — IRON AND TIBC
Iron: 76 ug/dL (ref 45–182)
Saturation Ratios: 15 % — ABNORMAL LOW (ref 17.9–39.5)
TIBC: 517 ug/dL — ABNORMAL HIGH (ref 250–450)
UIBC: 441 ug/dL

## 2016-03-15 LAB — COMPREHENSIVE METABOLIC PANEL
ALBUMIN: 4.2 g/dL (ref 3.5–5.0)
ALT: 18 U/L (ref 17–63)
AST: 21 U/L (ref 15–41)
Alkaline Phosphatase: 83 U/L (ref 38–126)
Anion gap: 7 (ref 5–15)
BILIRUBIN TOTAL: 0.5 mg/dL (ref 0.3–1.2)
BUN: 11 mg/dL (ref 6–20)
CO2: 26 mmol/L (ref 22–32)
Calcium: 8.7 mg/dL — ABNORMAL LOW (ref 8.9–10.3)
Chloride: 102 mmol/L (ref 101–111)
Creatinine, Ser: 0.73 mg/dL (ref 0.61–1.24)
GFR calc Af Amer: >60 mL/min (ref >60)
GFR calc non Af Amer: >60 mL/min (ref >60)
GLUCOSE: 88 mg/dL (ref 65–99)
POTASSIUM: 4 mmol/L (ref 3.5–5.1)
Sodium: 135 mmol/L (ref 135–145)
TOTAL PROTEIN: 7.6 g/dL (ref 6.5–8.1)

## 2016-03-15 LAB — CBC WITH DIFFERENTIAL/PLATELET
BASOS ABS: 0.1 10*3/uL (ref 0–0.1)
BASOS PCT: 1 %
Eosinophils Absolute: 0.2 10*3/uL (ref 0–0.7)
Eosinophils Relative: 2 %
HEMATOCRIT: 32.3 % — AB (ref 40.0–52.0)
HEMOGLOBIN: 9.8 g/dL — AB (ref 13.0–18.0)
Lymphocytes Relative: 21 %
Lymphs Abs: 2.5 10*3/uL (ref 1.0–3.6)
MCH: 18.9 pg — ABNORMAL LOW (ref 26.0–34.0)
MCHC: 30.5 g/dL — ABNORMAL LOW (ref 32.0–36.0)
MCV: 62 fL — ABNORMAL LOW (ref 80.0–100.0)
MONO ABS: 0.9 10*3/uL (ref 0.2–1.0)
Monocytes Relative: 7 %
NEUTROS ABS: 8.6 10*3/uL — AB (ref 1.4–6.5)
NEUTROS PCT: 69 %
Platelets: 460 10*3/uL — ABNORMAL HIGH (ref 150–440)
RBC: 5.21 MIL/uL (ref 4.40–5.90)
RDW: 21.4 % — AB (ref 11.5–14.5)
WBC: 12.3 10*3/uL — AB (ref 3.8–10.6)

## 2016-03-15 LAB — FERRITIN: FERRITIN: 10 ng/mL — AB (ref 24–336)

## 2016-03-15 MED ORDER — LIDOCAINE-PRILOCAINE 2.5-2.5 % EX CREA: 1.0000 "application " | TOPICAL_CREAM | CUTANEOUS | Status: DC | PRN

## 2016-03-15 MED ORDER — ONDANSETRON HCL 8 MG PO TABS: 8.0000 mg | ORAL_TABLET | Freq: Three times a day (TID) | ORAL | Status: DC | PRN

## 2016-03-15 MED ORDER — PROCHLORPERAZINE MALEATE 10 MG PO TABS: 10.0000 mg | ORAL_TABLET | Freq: Four times a day (QID) | ORAL | Status: DC | PRN

## 2016-03-15 NOTE — Assessment & Plan Note (Addendum)
#   Ascending colon cancer; stage III [tiny liver lesions x2-C discussion below]. Recommend PET scan for full staging. Recommend adjuvant chemotherapy with FOLFOX every 2 weeks 12. Given the positive margin- at surgery question need for radiation.   Discussed the potential side effects including but not limited to-increasing fatigue, nausea vomiting, diarrhea, hair loss, sores in the mouth, increase risk of infection and also neuropathy.   # Subcentimeter to liver lesions noted in the liver- the etiology is unclear; recommend MRI of the liver for further evaluation.   # Iron deficiency anemia- hemoglobin 9; on by mouth iron causing constipation. Recommend iron studies today/CBC today- if low recommend IV iron; discussed infusion reaction.  # Chemotherapy education; port placement. Hopefully the planned start chemotherapy week of 24th July;  Antiemetics-Zofran and Compazine; EMLA cream sent to pharmacy.    # I reviewed the blood work- with the patient in detail; also reviewed the imaging independently [as summarized above]; and with the patient in detail.   # The above plan of care was discussed with the patient; his brother- in detail. All questions were answered. Patient will follow-up with me on July 24/labs/chemotherapy starting the day.  # 60 minutes face-to-face with the patient discussing the above plan of care; more than 50% of time spent on prognosis/ natural history; counseling and coordination.   I spoke to patient's brother /pathologist Leibish Paynter; [phone] regarding the above plan.

## 2016-03-15 NOTE — Progress Notes (Signed)
Patient here today as new evaluation regarding colon cancer.  Referred by Dr. Jamal Collin.  Port placement to be done this Thursday.

## 2016-03-15 NOTE — Progress Notes (Signed)
[facility] NOTE  Patient Care Team: Marden Noble, MD as PCP - General (Internal Medicine) Marden Noble, MD (Internal Medicine) Christene Lye, MD (General Surgery)  CHIEF COMPLAINTS/PURPOSE OF CONSULTATION:  Oncology History   # June-July 2017- COLON CA STAGE III [pT3pN2 (4/14LN positive); POSITIVE RADIAL MARGIN]; pre-op CEA- 1.5/N;   # CT [id number]- 2 sub-cm liver lesions  # IDA-   # MOLECULAR TESTING: MSI-STABLE; F-ONE-7/10     Cancer of ascending colon ([place])   02/26/2016 Initial Diagnosis Cancer of ascending colon ([place])     HISTORY OF PRESENTING ILLNESS:  Philip Shah 60 y.o.  male with no significant medical problems noted to have some blood in stools approximately month ago when he was in [place]. Patient was feeling tired-evaluated by PCP noted to have low hemoglobin/ iron deficiency anemia. Patient was promptly evaluated by Dr. Jamal Collin with a colonoscopy that showed an ascending colon mass positive for adenocarcinoma. Patient went on to have laparoscopic hemicolectomy- pathology summary is as above/ Patient had a ascending colon mass adherent to the pelvis wall; radial margin positive [less than 1 mm].   Patient is healing well from surgery. Denies any abdominal pain. Denies any nausea vomiting. No weight loss. No cough or shortness of breath or chest pain. Patient has intermittent constipation on iron pills.   ROS: A complete 10 point review of system is done which is negative except mentioned above in history of present illness. Mild tingling and numbness of the tips of toes.   MEDICAL HISTORY:  Past Medical History  Diagnosis Date  . Enlarged prostate   . GERD (gastroesophageal reflux disease)   . Hemorrhoids   . Sciatica   . Anemia   . Abnormal colonoscopy 02-2016  . Hx of ligation of vein 1981  . H/O vasectomy 76  . Status post colon resection 02-26-16    SURGICAL HISTORY: Past Surgical History  Procedure Laterality Date   . Vein ligation Right 1980's    Dr Pat Kocher  . Vasectomy    . Colonoscopy with propofol N/A 02/11/2016    Procedure: COLONOSCOPY WITH PROPOFOL;  Surgeon: Christene Lye, MD;  Location: [facility] ENDOSCOPY;  Service: Endoscopy;  Laterality: N/A;  . Esophagogastroduodenoscopy (egd) with propofol N/A 02/11/2016    Procedure: ESOPHAGOGASTRODUODENOSCOPY (EGD) WITH PROPOFOL;  Surgeon: Christene Lye, MD;  Location: [facility] ENDOSCOPY;  Service: Endoscopy;  Laterality: N/A;  . Laparoscopic right colectomy Right 02/26/2016    Procedure: LAPAROSCOPIC RIGHT COLECTOMY;  Surgeon: Christene Lye, MD;  Location: [facility] ORS;  Service: General;  Laterality: Right;  . Appendectomy      SOCIAL HISTORY: 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 Marital Status: Single    Spouse Name: N/A  . Number of Children: N/A  . Years of Education: N/A   Occupational History  . Not on file.   Social History Main Topics  . Smoking status: Never Smoker   . Smokeless tobacco: Never Used  . Alcohol Use: 0.0 oz/week    0 Standard drinks or equivalent per week     Comment: 3/day  . Drug Use: No  . Sexual Activity: Not on file   Other Topics Concern  . Not on file   Social History Narrative    FAMILY HISTORY: mat aunt- anal cancer; oldest brother/pre-cancerous polyps- [Dr. In [place]] Family History  Problem Relation Age of Onset  . Cancer Father  spine  . Kidney failure Mother     ALLERGIES:  is allergic to penicillins.  MEDICATIONS:  Current Outpatient Prescriptions  Medication Sig Dispense Refill  . ferrous sulfate (FERROUSUL) 325 (65 FE) MG tablet Take 1 tablet (325 mg total) by mouth daily with breakfast. 30 tablet 3  . Multiple Vitamin (MULTIVITAMIN WITH MINERALS) TABS tablet Take 1 tablet by mouth daily.    Marland Kitchen lidocaine-prilocaine (EMLA) cream Apply 1 application topically as needed. Apply generously over  the Mediport 45 minutes prior to chemotherapy. 30 g 0  . ondansetron (ZOFRAN) 8 MG tablet Take 1 tablet (8 mg total) by mouth every 8 (eight) hours as needed for nausea or vomiting (start 3 days; after chemo). 40 tablet 1  . prochlorperazine (COMPAZINE) 10 MG tablet Take 1 tablet (10 mg total) by mouth every 6 (six) hours as needed for nausea or vomiting. 40 tablet 1   No current facility-administered medications for this visit.      Marland Kitchen  PHYSICAL EXAMINATION: ECOG PERFORMANCE STATUS: 0 - Asymptomatic  Filed Vitals:   03/15/16 1047  BP: 150/89  Pulse: 84  Temp: 98.5 F (36.9 C)  Resp: 18   Filed Weights   03/15/16 1047  Weight: 199 lb 15.3 oz (90.7 kg)    GENERAL: Well-nourished well-developed; Alert, no distress and comfortable. Accompanied by his brother.  EYES: no pallor or icterus OROPHARYNX: no thrush or ulceration; good dentition  NECK: supple, no masses felt LYMPH:  no palpable lymphadenopathy in the cervical, axillary or inguinal regions LUNGS: clear to auscultation and  No wheeze or crackles HEART/CVS: regular rate & rhythm and no murmurs; No lower extremity edema ABDOMEN: abdomen soft, non-tender and normal bowel sounds; laparoscopic incision healing well.  Musculoskeletal:no cyanosis of digits and no clubbing  PSYCH: alert & oriented x 3 with fluent speech NEURO: no focal motor/sensory deficits SKIN:  no rashes or significant lesions  LABORATORY DATA:  I have reviewed the data as listed Lab Results  Component Value Date   WBC 12.3* 03/15/2016   HGB 9.8* 03/15/2016   HCT 32.3* 03/15/2016   MCV 62.0* 03/15/2016   PLT 460* 03/15/2016    Recent Labs  02/18/16 0852 02/26/16 1242 02/27/16 0455  NA 136  --  134*  K 4.3  --  4.2  CL 103  --  105  CO2 27  --  24  GLUCOSE 119*  --  136*  BUN 14  --  12  CREATININE 0.84 0.90 0.75  CALCIUM 8.6*  --  7.8*  GFRNONAA >60 >60 >60  GFRAA >60 >60 >60  PROT 7.0  --   --   ALBUMIN 3.9  --   --   AST 19  --   --    ALT 19  --   --   ALKPHOS 71  --   --   BILITOT 0.5  --   --     RADIOGRAPHIC STUDIES: I have personally reviewed the radiological images as listed and agreed with the findings in the report. Ct Abdomen Pelvis W Contrast  02/18/2016  CLINICAL DATA:  60 year old male with history of right-sided abdominal pain and anemia for the past several weeks. History of colonoscopy with new diagnosis of colon cancer. EXAM: CT ABDOMEN AND PELVIS WITH CONTRAST TECHNIQUE: Multidetector CT imaging of the abdomen and pelvis was performed using the standard protocol following bolus administration of intravenous contrast. CONTRAST:  126m ISOVUE-300 IOPAMIDOL (ISOVUE-300) INJECTION 61% COMPARISON:  No priors. FINDINGS: Lower chest:  Unremarkable. Hepatobiliary: Diffuse low attenuation throughout the hepatic parenchyma, compatible with hepatic steatosis. Calcified granuloma in the left lobe of the liver. There are 2 sub cm low-attenuation lesions in the left lobe of the liver in segment 4A on images 17 and 18 of series 2, too small to definitively characterize. No other larger more suspicious appearing hepatic lesions are noted. No intra or extrahepatic biliary ductal dilatation. Gallbladder is normal in appearance. Pancreas: No pancreatic mass. No pancreatic ductal dilatation. No pancreatic or peripancreatic fluid or inflammatory changes. Spleen: Small calcified granuloma in the inferior aspect of the spleen. Adrenals/Urinary Tract: Bilateral kidneys and bilateral adrenal glands are normal in appearance. No hydroureteronephrosis. Urinary bladder is normal in appearance. Stomach/Bowel: Normal appearance of the stomach. No pathologic dilatation of small bowel or colon. In the ascending colon there is an infiltrative mass which is irregular in shape but estimated to measure approximately 6.5 x 5.2 x 5.5 cm (axial image 44 of series 2 and sagittal image 30 of series 603), compatible with the reported colonic neoplasm. This  appears to originate at or immediately above the level of the ileocecal valve and extends cephalad. On the image 43 there is some posterior extension of the tumor beyond the serosal surface of the bowel, indicative of early invasive disease (T3), without definite direct invasion of the undersurface of the right lobe of the liver at this time. Additionally, there is an enhancing soft tissue nodule measuring 12 mm in short axis immediately anterior to the mass (image 45 of series 2) which likely represents a malignant lymph node. Several other ileocolic lymph nodes appear to be matted together and are borderline to mildly enlarged measuring up to 11 mm in short axis. Normal appendix. Vascular/Lymphatic: Mild atherosclerosis throughout the abdominal and pelvic vasculature, without evidence of aneurysm or dissection. Lymphadenopathy adjacent to the colonic mass and in the ileocolic mesentery, as detailed above. No other lymphadenopathy noted elsewhere in the abdomen or pelvis. Reproductive: Prostate gland and seminal vesicles are unremarkable in appearance. Bilateral vasectomy clips are noted. Other: No significant volume of ascites.  No pneumoperitoneum. Musculoskeletal: There are no aggressive appearing lytic or blastic lesions noted in the visualized portions of the skeleton. IMPRESSION: 1. 6.5 x 5.2 x 5.5 cm infiltrative mass in the ascending colon with evidence of direct extension beyond the serosal surface of the colon (not yet directly invading the adjacent liver), as well as multiple surrounding lymph nodes extending into the ileocolic mesentery, indicative of at least stage T3, N2 disease. 2. Sub cm low-attenuation liver lesions in segment 4A of the liver are too small to characterize. The possibility of metastatic disease is not excluded, and correlation with MRI of the abdomen with and without IV gadolinium is recommended at this time. 3. Hepatic steatosis. 4. Atherosclerosis. Electronically Signed   By:  Vinnie Langton M.D.   On: 02/18/2016 12:00    ASSESSMENT & PLAN:   Cancer of ascending colon ([place]) # Ascending colon cancer; stage III [tiny liver lesions x2-C discussion below]. Recommend PET scan for full staging. Recommend adjuvant chemotherapy with FOLFOX every 2 weeks 12. Given the positive margin- at surgery question need for radiation.   Discussed the potential side effects including but not limited to-increasing fatigue, nausea vomiting, diarrhea, hair loss, sores in the mouth, increase risk of infection and also neuropathy.   # Subcentimeter to liver lesions noted in the liver- the etiology is unclear; recommend MRI of the liver for further evaluation.   # Iron deficiency anemia-  hemoglobin 9; on by mouth iron causing constipation. Recommend iron studies today/CBC today- if low recommend IV iron; discussed infusion reaction.  # Chemotherapy education; port placement. Hopefully the planned start chemotherapy week of 24th July;  Antiemetics-Zofran and Compazine; EMLA cream sent to pharmacy.    # I reviewed the blood work- with the patient in detail; also reviewed the imaging independently [as summarized above]; and with the patient in detail.   # The above plan of care was discussed with the patient; his brother- in detail. All questions were answered. Patient will follow-up with me on July 24/labs/chemotherapy starting the day.  # 60 minutes face-to-face with the patient discussing the above plan of care; more than 50% of time spent on prognosis/ natural history; counseling and coordination.   I spoke to patient's brother /pathologist Philip Shah; [phone] regarding the above plan.   Thank you Dr.Sankar for allowing me to participate in the care of your pleasant patient. Please do not hesitate to contact me with questions or concerns in the interim. All questions were answered. The patient knows to call the clinic with any problems, questions or concerns.    Cammie Sickle, MD 03/15/2016 12:53 PM

## 2016-03-17 ENCOUNTER — Ambulatory Visit
Admission: RE | Admit: 2016-03-17 | Discharge: 2016-03-17 | Disposition: A | Discharge status: Home / Self Care | Payer: BLUE CROSS/BLUE SHIELD | Source: Ambulatory Visit | Attending: Internal Medicine | Admitting: Internal Medicine

## 2016-03-17 ENCOUNTER — Encounter: Payer: Self-pay | Admitting: *Deleted

## 2016-03-17 DIAGNOSIS — Z9049 Acquired absence of other specified parts of digestive tract: Secondary | ICD-10-CM | POA: Insufficient documentation

## 2016-03-17 DIAGNOSIS — K7689 Other specified diseases of liver: Secondary | ICD-10-CM | POA: Insufficient documentation

## 2016-03-17 DIAGNOSIS — C182 Malignant neoplasm of ascending colon: Secondary | ICD-10-CM | POA: Diagnosis present

## 2016-03-17 DIAGNOSIS — K769 Liver disease, unspecified: Secondary | ICD-10-CM

## 2016-03-17 MED ORDER — GADOBENATE DIMEGLUMINE 529 MG/ML IV SOLN
18.0000 mL | Freq: Once | INTRAVENOUS | Status: AC | PRN
  Administered 2016-03-17: 18 mL via INTRAVENOUS

## 2016-03-17 NOTE — Patient Instructions (Signed)
  Your procedure is scheduled on: 03-18-16 Report to Same Day Surgery 2nd floor medical mall To find out your arrival time please call [phone] between 1PM - 3PM on 03-17-16  Remember: Instructions that are not followed completely may result in serious medical risk, up to and including death, or upon the discretion of your surgeon and anesthesiologist your surgery may need to be rescheduled.    _x___ 1. Do not eat food or drink liquids after midnight. No gum chewing or hard candies.     __x__ 2. No Alcohol for 24 hours before or after surgery.   __x__3. No Smoking for 24 prior to surgery.   ____  4. Bring all medications with you on the day of surgery if instructed.    __x__ 5. Notify your doctor if there is any change in your medical condition     (cold, fever, infections).     Do not wear jewelry, make-up, hairpins, clips or nail polish.  Do not wear lotions, powders, or perfumes. You may wear deodorant.  Do not shave 48 hours prior to surgery. Men may shave face and neck.  Do not bring valuables to the hospital.    [facility] is not responsible for any belongings or valuables.               Contacts, dentures or bridgework may not be worn into surgery.  Leave your suitcase in the car. After surgery it may be brought to your room.  For patients admitted to the hospital, discharge time is determined by your treatment team.   Patients discharged the day of surgery will not be allowed to drive home.    Please read over the following fact sheets that you were given:   [facility] Preparing for Surgery and or MRSA Information   ____ Take these medicines the morning of surgery with A SIP OF WATER:    1. NONE  2.  3.  4.  5.  6.  ____ Fleet Enema (as directed)   ____ Use CHG Soap or sage wipes as directed on instruction sheet   ____ Use inhalers on the day of surgery and bring to hospital day of surgery  ____ Stop metformin 2 days prior to surgery    ____ Take 1/2 of  usual insulin dose the night before surgery and none on the morning of           surgery.   ____ Stop aspirin or coumadin, or plavix  _x__ Stop Anti-inflammatories such as Advil, Aleve, Ibuprofen, Motrin, Naproxen,          Naprosyn, Goodies powders or aspirin products. Ok to take Tylenol.   ____ Stop supplements until after surgery.    ____ Bring C-Pap to the hospital.

## 2016-03-18 ENCOUNTER — Ambulatory Visit: Payer: BLUE CROSS/BLUE SHIELD | Admitting: Anesthesiology

## 2016-03-18 ENCOUNTER — Ambulatory Visit: Payer: BLUE CROSS/BLUE SHIELD

## 2016-03-18 ENCOUNTER — Encounter
Admission: RE | Disposition: A | Discharge status: Home / Self Care | Payer: Self-pay | Source: Ambulatory Visit | Attending: General Surgery

## 2016-03-18 ENCOUNTER — Encounter: Payer: Self-pay | Admitting: *Deleted

## 2016-03-18 ENCOUNTER — Ambulatory Visit
Admission: RE | Admit: 2016-03-18 | Discharge: 2016-03-18 | Disposition: A | Discharge status: Home / Self Care | Payer: BLUE CROSS/BLUE SHIELD | Source: Ambulatory Visit | Attending: General Surgery | Admitting: General Surgery

## 2016-03-18 DIAGNOSIS — C182 Malignant neoplasm of ascending colon: Secondary | ICD-10-CM | POA: Diagnosis present

## 2016-03-18 DIAGNOSIS — G709 Myoneural disorder, unspecified: Secondary | ICD-10-CM | POA: Insufficient documentation

## 2016-03-18 DIAGNOSIS — D649 Anemia, unspecified: Secondary | ICD-10-CM | POA: Insufficient documentation

## 2016-03-18 DIAGNOSIS — Z95828 Presence of other vascular implants and grafts: Secondary | ICD-10-CM

## 2016-03-18 DIAGNOSIS — K219 Gastro-esophageal reflux disease without esophagitis: Secondary | ICD-10-CM | POA: Diagnosis not present

## 2016-03-18 HISTORY — PX: PORTACATH PLACEMENT: SHX2246

## 2016-03-18 SURGERY — INSERTION, TUNNELED CENTRAL VENOUS DEVICE, WITH PORT
Anesthesia: Monitor Anesthesia Care

## 2016-03-18 MED ORDER — CHLORHEXIDINE GLUCONATE CLOTH 2 % EX PADS: 6.0000 | MEDICATED_PAD | Freq: Once | CUTANEOUS | Status: DC

## 2016-03-18 MED ORDER — BUPIVACAINE HCL (PF) 0.5 % IJ SOLN
INTRAMUSCULAR | Status: AC
  Filled 2016-03-18: qty 30

## 2016-03-18 MED ORDER — ONDANSETRON HCL 4 MG/2ML IJ SOLN
INTRAMUSCULAR | Status: DC | PRN
  Administered 2016-03-18: 4 mg via INTRAVENOUS

## 2016-03-18 MED ORDER — CEFAZOLIN SODIUM-DEXTROSE 2-4 GM/100ML-% IV SOLN
INTRAVENOUS | Status: AC
  Administered 2016-03-18: 2 g via INTRAVENOUS
  Filled 2016-03-18: qty 100

## 2016-03-18 MED ORDER — LACTATED RINGERS IV SOLN
INTRAVENOUS | Status: DC
  Administered 2016-03-18 (×2): via INTRAVENOUS

## 2016-03-18 MED ORDER — FAMOTIDINE 20 MG PO TABS
ORAL_TABLET | ORAL | Status: AC
  Filled 2016-03-18: qty 1

## 2016-03-18 MED ORDER — PROPOFOL 500 MG/50ML IV EMUL
INTRAVENOUS | Status: DC | PRN
  Administered 2016-03-18: 75 ug/kg/min via INTRAVENOUS

## 2016-03-18 MED ORDER — HEPARIN SODIUM (PORCINE) 5000 UNIT/ML IJ SOLN
INTRAMUSCULAR | Status: AC
  Filled 2016-03-18: qty 1

## 2016-03-18 MED ORDER — PROPOFOL 10 MG/ML IV BOLUS
INTRAVENOUS | Status: DC | PRN
  Administered 2016-03-18: 20 mg via INTRAVENOUS
  Administered 2016-03-18: 30 mg via INTRAVENOUS
  Administered 2016-03-18: 20 mg via INTRAVENOUS

## 2016-03-18 MED ORDER — MIDAZOLAM HCL 2 MG/2ML IJ SOLN
INTRAMUSCULAR | Status: DC | PRN
Start: 2016-03-18 — End: 2016-03-18
  Administered 2016-03-18: 2 mg via INTRAVENOUS

## 2016-03-18 MED ORDER — FAMOTIDINE 20 MG PO TABS
20.0000 mg | ORAL_TABLET | Freq: Once | ORAL | Status: AC
  Administered 2016-03-18: 20 mg via ORAL

## 2016-03-18 MED ORDER — TRAMADOL HCL 50 MG PO TABS: 50.0000 mg | ORAL_TABLET | Freq: Four times a day (QID) | ORAL | Status: DC | PRN

## 2016-03-18 MED ORDER — FENTANYL CITRATE (PF) 100 MCG/2ML IJ SOLN: 25.0000 ug | INTRAMUSCULAR | Status: DC | PRN

## 2016-03-18 MED ORDER — ACETAMINOPHEN 10 MG/ML IV SOLN
INTRAVENOUS | Status: DC | PRN
  Administered 2016-03-18: 1000 mg via INTRAVENOUS

## 2016-03-18 MED ORDER — ACETAMINOPHEN 10 MG/ML IV SOLN
INTRAVENOUS | Status: AC
  Filled 2016-03-18: qty 100

## 2016-03-18 MED ORDER — LIDOCAINE HCL (PF) 1 % IJ SOLN
INTRAMUSCULAR | Status: AC
  Filled 2016-03-18: qty 30

## 2016-03-18 MED ORDER — CEFAZOLIN SODIUM-DEXTROSE 2-4 GM/100ML-% IV SOLN: 2.0000 g | INTRAVENOUS | Status: DC

## 2016-03-18 MED ORDER — LIDOCAINE HCL 1 % IJ SOLN
INTRAMUSCULAR | Status: DC | PRN
  Administered 2016-03-18: 18 mL via INTRAMUSCULAR

## 2016-03-18 MED ORDER — ONDANSETRON HCL 4 MG/2ML IJ SOLN: 4.0000 mg | Freq: Once | INTRAMUSCULAR | Status: DC | PRN

## 2016-03-18 MED ORDER — FENTANYL CITRATE (PF) 100 MCG/2ML IJ SOLN
INTRAMUSCULAR | Status: DC | PRN
  Administered 2016-03-18: 25 ug via INTRAVENOUS

## 2016-03-18 SURGICAL SUPPLY — 28 items
BAG DECANTER FOR FLEXI CONT (MISCELLANEOUS) ×3 IMPLANT
BLADE SURG 15 STRL SS SAFETY (BLADE) ×3 IMPLANT
CANISTER SUCT 1200ML W/VALVE (MISCELLANEOUS) ×3 IMPLANT
CHLORAPREP W/TINT 26ML (MISCELLANEOUS) ×3 IMPLANT
COVER LIGHT HANDLE STERIS (MISCELLANEOUS) ×6 IMPLANT
DECANTER SPIKE VIAL GLASS SM (MISCELLANEOUS) ×6 IMPLANT
DRAPE C-ARM XRAY 36X54 (DRAPES) ×3 IMPLANT
ELECT REM PT RETURN 9FT ADLT (ELECTROSURGICAL) ×3
ELECTRODE REM PT RTRN 9FT ADLT (ELECTROSURGICAL) ×1 IMPLANT
GLOVE BIO SURGEON STRL SZ7 (GLOVE) ×15 IMPLANT
GOWN STRL REUS W/ TWL LRG LVL3 (GOWN DISPOSABLE) ×3 IMPLANT
GOWN STRL REUS W/TWL LRG LVL3 (GOWN DISPOSABLE) ×6
IV NS 500ML (IV SOLUTION) ×2
IV NS 500ML BAXH (IV SOLUTION) ×1 IMPLANT
KIT PORT POWER 8FR ISP CVUE (Catheter) ×3 IMPLANT
KIT RM TURNOVER STRD PROC AR (KITS) ×3 IMPLANT
LABEL OR SOLS (LABEL) ×3 IMPLANT
LIQUID BAND (GAUZE/BANDAGES/DRESSINGS) ×3 IMPLANT
NEEDLE FILTER BLUNT 18X 1/2SAF (NEEDLE) ×2
NEEDLE FILTER BLUNT 18X1 1/2 (NEEDLE) ×1 IMPLANT
NEEDLE HYPO 25GX1X1/2 BEV (NEEDLE) ×3 IMPLANT
NS IRRIG 500ML POUR BTL (IV SOLUTION) IMPLANT
PACK PORT-A-CATH (MISCELLANEOUS) ×3 IMPLANT
SUT PROLENE 2 0 SH DA (SUTURE) ×3 IMPLANT
SUT VIC AB 3-0 SH 27 (SUTURE) ×2
SUT VIC AB 3-0 SH 27X BRD (SUTURE) ×1 IMPLANT
SUT VIC AB 4-0 FS2 27 (SUTURE) ×3 IMPLANT
SYR 3ML LL SCALE MARK (SYRINGE) ×3 IMPLANT

## 2016-03-18 NOTE — Interval H&P Note (Signed)
History and Physical Interval Note:  03/18/2016 7:06 AM  Philip Shah  has presented today for surgery, with the diagnosis of CANCER  The various methods of treatment have been discussed with the patient and family. After consideration of risks, benefits and other options for treatment, the patient has consented to  Procedure(s): INSERTION PORT-A-CATH (N/A) as a surgical intervention .  The patient's history has been reviewed, patient examined, no change in status, stable for surgery.  I have reviewed the patient's chart and labs.  Questions were answered to the patient's satisfaction.     Kazmir Oki G

## 2016-03-18 NOTE — Anesthesia Postprocedure Evaluation (Signed)
Anesthesia Post Note  Patient: Philip Shah  Procedure(s) Performed: Procedure(s) (LRB): INSERTION PORT-A-CATH (N/A)  Patient location during evaluation: PACU Anesthesia Type: General Level of consciousness: awake and alert Pain management: pain level controlled Vital Signs Assessment: post-procedure vital signs reviewed and stable Respiratory status: spontaneous breathing, nonlabored ventilation, respiratory function stable and patient connected to nasal cannula oxygen Cardiovascular status: blood pressure returned to baseline and stable Postop Assessment: no signs of nausea or vomiting Anesthetic complications: no    Last Vitals:  Filed Vitals:   03/18/16 0920 03/18/16 0957  BP: 137/77 148/77  Pulse: 76 68  Temp: 36.7 C   Resp: 16 165    Last Pain:  Filed Vitals:   03/18/16 1002  PainSc: 0-No pain                 Lasharon Dunivan S

## 2016-03-18 NOTE — Discharge Instructions (Signed)
General Anesthesia, Adult °General anesthesia is a sleep-like state of non-feeling produced by medicines (anesthetics). General anesthesia prevents you from being alert and feeling pain during a medical procedure. Your caregiver may recommend general anesthesia if your procedure: °· Is long. °· Is painful or uncomfortable. °· Would be frightening to see or hear. °· Requires you to be still. °· Affects your breathing. °· Causes significant blood loss. °LET YOUR CAREGIVER KNOW ABOUT: °· Allergies to food or medicine. °· Medicines taken, including vitamins, herbs, eyedrops, over-the-counter medicines, and creams. °· Use of steroids (by mouth or creams). °· Previous problems with anesthetics or numbing medicines, including problems experienced by relatives. °· History of bleeding problems or blood clots. °· Previous surgeries and types of anesthetics received. °· Possibility of pregnancy, if this applies. °· Use of cigarettes, alcohol, or illegal drugs. °· Any health condition(s), especially diabetes, sleep apnea, and high blood pressure. °RISKS AND COMPLICATIONS °General anesthesia rarely causes complications. However, if complications do occur, they can be life threatening. Complications include: °· A lung infection. °· A stroke. °· A heart attack. °· Waking up during the procedure. When this occurs, the patient may be unable to move and communicate that he or she is awake. The patient may feel severe pain. °Older adults and adults with serious medical problems are more likely to have complications than adults who are young and healthy. Some complications can be prevented by answering all of your caregiver's questions thoroughly and by following all pre-procedure instructions. It is important to tell your caregiver if any of the pre-procedure instructions, especially those related to diet, were not followed. Any food or liquid in the stomach can cause problems when you are under general anesthesia. °BEFORE THE  PROCEDURE °· Ask your caregiver if you will have to spend the night at the hospital. If you will not have to spend the night, arrange to have an adult drive you and stay with you for 24 hours. °· Follow your caregiver's instructions if you are taking dietary supplements or medicines. Your caregiver may tell you to stop taking them or to reduce your dosage. °· Do not smoke for as long as possible before your procedure. If possible, stop smoking 3-6 weeks before the procedure. °· Do not take new dietary supplements or medicines within 1 week of your procedure unless your caregiver approves them. °· Do not eat within 8 hours of your procedure or as directed by your caregiver. Drink only clear liquids, such as water, black coffee (without milk or cream), and fruit juices (without pulp). °· Do not drink within 3 hours of your procedure or as directed by your caregiver. °· You may brush your teeth on the morning of the procedure, but make sure to spit out the toothpaste and water when finished. °PROCEDURE  °You will receive anesthetics through a mask, through an intravenous (IV) access tube, or through both. A doctor who specializes in anesthesia (anesthesiologist) or a nurse who specializes in anesthesia (nurse anesthetist) or both will stay with you throughout the procedure to make sure you remain unconscious. He or she will also watch your blood pressure, pulse, and oxygen levels to make sure that the anesthetics do not cause any problems. Once you are asleep, a breathing tube or mask may be used to help you breathe. °AFTER THE PROCEDURE °You will wake up after the procedure is complete. You may be in the room where the procedure was performed or in a recovery area. You may have a sore throat   if a breathing tube was used. You may also feel: °· Dizzy. °· Weak. °· Drowsy. °· Confused. °· Nauseous. °· Cold. °These are all normal responses and can be expected to last for up to 24 hours after the procedure is complete. A  caregiver will tell you when you are ready to go home. This will usually be when you are fully awake and in stable condition. °  °This information is not intended to replace advice given to you by your health care provider. Make sure you discuss any questions you have with your health care provider. °  °Document Released: 11/30/2007 Document Revised: 09/13/2014 Document Reviewed: 12/22/2011 °Elsevier Interactive Patient Education ©2016 [employer] ° °

## 2016-03-18 NOTE — Transfer of Care (Signed)
Immediate Anesthesia Transfer of Care Note  Patient: Philip Shah  Procedure(s) Performed: Procedure(s): INSERTION PORT-A-CATH (N/A)  Patient Location: PACU  Anesthesia Type:MAC  Level of Consciousness: awake, alert  and oriented  Airway & Oxygen Therapy: Patient Spontanous Breathing  Post-op Assessment: Report given to RN and Post -op Vital signs reviewed and stable  Post vital signs: Reviewed and stable  Last Vitals:  Filed Vitals:   03/18/16 0610  BP: 137/82  Pulse: 78  Temp: 37.1 C  Resp: 16    Last Pain:  Filed Vitals:   03/18/16 0615  PainSc: 2          Complications: No apparent anesthesia complications

## 2016-03-18 NOTE — Anesthesia Preprocedure Evaluation (Signed)
Anesthesia Evaluation  Patient identified by MRN, date of birth, ID band Patient awake    Reviewed: Allergy & Precautions, NPO status , Patient's Chart, lab work & pertinent test results, reviewed documented beta blocker date and time   Airway Mallampati: II  TM Distance: >3 FB     Dental  (+) Chipped   Pulmonary           Cardiovascular      Neuro/Psych  Neuromuscular disease    GI/Hepatic GERD  Controlled,  Endo/Other    Renal/GU      Musculoskeletal   Abdominal   Peds  Hematology  (+) anemia ,   Anesthesia Other Findings   Reproductive/Obstetrics                             Anesthesia Physical Anesthesia Plan  ASA: III  Anesthesia Plan: MAC   Post-op Pain Management:    Induction: Intravenous  Airway Management Planned:   Additional Equipment:   Intra-op Plan:   Post-operative Plan:   Informed Consent: I have reviewed the patients History and Physical, chart, labs and discussed the procedure including the risks, benefits and alternatives for the proposed anesthesia with the patient or authorized representative who has indicated his/her understanding and acceptance.     Plan Discussed with: CRNA  Anesthesia Plan Comments:         Anesthesia Quick Evaluation

## 2016-03-18 NOTE — Patient Instructions (Signed)

## 2016-03-18 NOTE — Op Note (Signed)
Preop diagnosis: Carcinoma of the ascending colon  Post op diagnosis: Same  Operation: Insertion venous access port with ultrasound fluoroscopic guidance  Surgeon: Mckinley Jewel   Assistant   Anesthesia: Monitored anesthesia care. Local anesthetic of 1% Xylocaine mixed with 0.5% Marcaine total 15 mL  Complications: None  EBL:  minimal  Drains: None  Description: Patient was placed supine on the operating table and with adequate sedation and monitoring the left upper chest and neck area were prepped and draped as sterile field. He was in place and in Trendelenburg. Timeout was performed. Ultrasound probe was sterile, brought up to the field in the subclavian vein was easily identified beneath the lateral end of the clavicle. Local anesthetic was instilled at the infraclavicular site and a 1 cm incision made. With ultrasound guidance needle was successfully positioned in the subclavian vein with free withdrawal of blood. Following this the Seldinger technique was utilized and the catheter positioned going into the superior vena cava with fluoroscopic guidance. The skin mark was at 25 cm. Subcutaneous port was created in the second intercostal space. After instillation of local anesthetic incision was made about 3 cm in length and the subcutaneous tissue elevated on both sides to create the pocket. The catheter was tunneled through to the port site and cut to approximate length. The catheter was then fixed to a prefilled port and secured. The port was placed in the subcutaneous pocket and anchored with 3 stitches of 2-0 Prolene to the underlying fascia. Fluoroscopy was repeated and showed the catheter had no kinks along the tract and was still in position in the superior vena cava. The port was flushed through with heparinized saline. Subcutaneous tissue closed with 3-0 Vicryl and the skin with subcuticular 4-0 Vicryl covered with liqui  Ban. Patient subsequently was returned to PACU in stable  condition.

## 2016-03-18 NOTE — Progress Notes (Signed)
Foundation one request sent out via fax. msg sent via fax to [facility] to alert the path dept that foundation one was ordered.

## 2016-03-18 NOTE — H&P (View-Only) (Signed)
Patient ID: Philip Shah, male   DOB: Jan 28, 1956, 60 y.o.   MRN: [id number] Patient here following up from a laparoscopic right colectomy done on 02/26/16. He reports that he is doing well. He is using the bathroom routinely. He reports that he has had very little discomfort.  I have reviewed the history of present illness with the patient.   Has been taking iron pills for supplementation for anemia. Endorses color changes of stool, this is consistent with side effect of iron supplements.  Abdomen soft, non-distended, and non-tender. Bowel sounds normoactive.  Incision healing well.  Lung clear to auscultation. Heart regular rate and rhythm.   Path- [id number]. Stage IIIB at least. Two tiny indistinct liver lesions.  Referral made for oncology eval- Dr. Rogue Bussing at the [facility] for chemotherapy. Discussed port-a-cath placement for chemotherapy in outpatient setting.   Hgb/hct to be ordered.   Patient's surgery has been scheduled for 03-18-16 at [facility].  PCP: Brynda Greathouse, E This has been scribed by Lesly Rubenstein LPN

## 2016-03-23 ENCOUNTER — Inpatient Hospital Stay: Payer: BLUE CROSS/BLUE SHIELD

## 2016-03-26 ENCOUNTER — Other Ambulatory Visit: Payer: Self-pay | Admitting: *Deleted

## 2016-03-26 ENCOUNTER — Encounter
Admission: RE | Admit: 2016-03-26 | Discharge: 2016-03-26 | Disposition: A | Discharge status: Home / Self Care | Payer: BLUE CROSS/BLUE SHIELD | Source: Ambulatory Visit | Attending: Internal Medicine | Admitting: Internal Medicine

## 2016-03-26 DIAGNOSIS — K7689 Other specified diseases of liver: Secondary | ICD-10-CM | POA: Insufficient documentation

## 2016-03-26 DIAGNOSIS — C182 Malignant neoplasm of ascending colon: Secondary | ICD-10-CM

## 2016-03-26 DIAGNOSIS — K769 Liver disease, unspecified: Secondary | ICD-10-CM

## 2016-03-26 MED ORDER — FLUDEOXYGLUCOSE F - 18 (FDG) INJECTION
12.2600 | Freq: Once | INTRAVENOUS | Status: AC | PRN
  Administered 2016-03-26: 12.26 via INTRAVENOUS

## 2016-03-29 ENCOUNTER — Inpatient Hospital Stay (HOSPITAL_BASED_OUTPATIENT_CLINIC_OR_DEPARTMENT_OTHER): Payer: BLUE CROSS/BLUE SHIELD | Admitting: Internal Medicine

## 2016-03-29 ENCOUNTER — Other Ambulatory Visit: Payer: Self-pay

## 2016-03-29 ENCOUNTER — Inpatient Hospital Stay: Payer: BLUE CROSS/BLUE SHIELD

## 2016-03-29 ENCOUNTER — Encounter: Payer: Self-pay | Admitting: Internal Medicine

## 2016-03-29 DIAGNOSIS — Z79899 Other long term (current) drug therapy: Secondary | ICD-10-CM

## 2016-03-29 DIAGNOSIS — Z808 Family history of malignant neoplasm of other organs or systems: Secondary | ICD-10-CM

## 2016-03-29 DIAGNOSIS — R591 Generalized enlarged lymph nodes: Secondary | ICD-10-CM

## 2016-03-29 DIAGNOSIS — D509 Iron deficiency anemia, unspecified: Secondary | ICD-10-CM

## 2016-03-29 DIAGNOSIS — C182 Malignant neoplasm of ascending colon: Secondary | ICD-10-CM

## 2016-03-29 DIAGNOSIS — K219 Gastro-esophageal reflux disease without esophagitis: Secondary | ICD-10-CM

## 2016-03-29 DIAGNOSIS — N400 Enlarged prostate without lower urinary tract symptoms: Secondary | ICD-10-CM

## 2016-03-29 DIAGNOSIS — M5430 Sciatica, unspecified side: Secondary | ICD-10-CM

## 2016-03-29 DIAGNOSIS — K7689 Other specified diseases of liver: Secondary | ICD-10-CM | POA: Diagnosis not present

## 2016-03-29 DIAGNOSIS — Z9049 Acquired absence of other specified parts of digestive tract: Secondary | ICD-10-CM

## 2016-03-29 DIAGNOSIS — K649 Unspecified hemorrhoids: Secondary | ICD-10-CM

## 2016-03-29 DIAGNOSIS — K5900 Constipation, unspecified: Secondary | ICD-10-CM

## 2016-03-29 DIAGNOSIS — K760 Fatty (change of) liver, not elsewhere classified: Secondary | ICD-10-CM

## 2016-03-29 DIAGNOSIS — R5383 Other fatigue: Secondary | ICD-10-CM

## 2016-03-29 DIAGNOSIS — Z800 Family history of malignant neoplasm of digestive organs: Secondary | ICD-10-CM

## 2016-03-29 DIAGNOSIS — I7090 Unspecified atherosclerosis: Secondary | ICD-10-CM

## 2016-03-29 LAB — COMPREHENSIVE METABOLIC PANEL
ALK PHOS: 69 U/L (ref 38–126)
ALT: 18 U/L (ref 17–63)
ANION GAP: 7 (ref 5–15)
AST: 21 U/L (ref 15–41)
Albumin: 4.2 g/dL (ref 3.5–5.0)
BUN: 11 mg/dL (ref 6–20)
CALCIUM: 8.9 mg/dL (ref 8.9–10.3)
CHLORIDE: 107 mmol/L (ref 101–111)
CO2: 24 mmol/L (ref 22–32)
Creatinine, Ser: 0.83 mg/dL (ref 0.61–1.24)
GFR calc non Af Amer: >60 mL/min (ref >60)
Glucose, Bld: 125 mg/dL — ABNORMAL HIGH (ref 65–99)
Potassium: 3.8 mmol/L (ref 3.5–5.1)
SODIUM: 138 mmol/L (ref 135–145)
Total Bilirubin: 0.5 mg/dL (ref 0.3–1.2)
Total Protein: 7.5 g/dL (ref 6.5–8.1)

## 2016-03-29 LAB — CBC WITH DIFFERENTIAL/PLATELET
Basophils Absolute: 0.1 10*3/uL (ref 0–0.1)
Basophils Relative: 1 %
EOS ABS: 0.1 10*3/uL (ref 0–0.7)
EOS PCT: 1 %
HCT: 34.2 % — ABNORMAL LOW (ref 40.0–52.0)
Hemoglobin: 10.7 g/dL — ABNORMAL LOW (ref 13.0–18.0)
LYMPHS ABS: 1.5 10*3/uL (ref 1.0–3.6)
Lymphocytes Relative: 15 %
MCH: 20.3 pg — AB (ref 26.0–34.0)
MCHC: 31.2 g/dL — AB (ref 32.0–36.0)
MCV: 64.9 fL — ABNORMAL LOW (ref 80.0–100.0)
MONOS PCT: 7 %
Monocytes Absolute: 0.7 10*3/uL (ref 0.2–1.0)
Neutro Abs: 7.5 10*3/uL — ABNORMAL HIGH (ref 1.4–6.5)
Neutrophils Relative %: 76 %
PLATELETS: 260 10*3/uL (ref 150–440)
RBC: 5.26 MIL/uL (ref 4.40–5.90)
RDW: 24 % — ABNORMAL HIGH (ref 11.5–14.5)
WBC: 9.8 10*3/uL (ref 3.8–10.6)

## 2016-03-29 LAB — GLUCOSE, CAPILLARY: Glucose-Capillary: 97 mg/dL (ref 65–99)

## 2016-03-29 MED ORDER — DEXTROSE 5 % IV SOLN
800.0000 mg | Freq: Once | INTRAVENOUS | Status: AC
  Administered 2016-03-29: 800 mg via INTRAVENOUS
  Filled 2016-03-29: qty 40

## 2016-03-29 MED ORDER — HEPARIN SOD (PORK) LOCK FLUSH 100 UNIT/ML IV SOLN: 500.0000 [IU] | Freq: Once | INTRAVENOUS | Status: DC

## 2016-03-29 MED ORDER — PALONOSETRON HCL INJECTION 0.25 MG/5ML
0.2500 mg | Freq: Once | INTRAVENOUS | Status: AC
  Administered 2016-03-29: 0.25 mg via INTRAVENOUS

## 2016-03-29 MED ORDER — SODIUM CHLORIDE 0.9% FLUSH
10.0000 mL | INTRAVENOUS | Status: DC | PRN
  Administered 2016-03-29: 10 mL via INTRAVENOUS
  Filled 2016-03-29: qty 10

## 2016-03-29 MED ORDER — SODIUM CHLORIDE 0.9 % IV SOLN
10.0000 mg | Freq: Once | INTRAVENOUS | Status: AC
  Administered 2016-03-29: 10 mg via INTRAVENOUS
  Filled 2016-03-29: qty 1

## 2016-03-29 MED ORDER — DEXTROSE 5 % IV SOLN
85.0000 mg/m2 | Freq: Once | INTRAVENOUS | Status: AC
  Administered 2016-03-29: 175 mg via INTRAVENOUS
  Filled 2016-03-29: qty 35

## 2016-03-29 MED ORDER — FLUOROURACIL CHEMO INJECTION 2.5 GM/50ML
400.0000 mg/m2 | Freq: Once | INTRAVENOUS | Status: AC
  Administered 2016-03-29: 800 mg via INTRAVENOUS
  Filled 2016-03-29: qty 16

## 2016-03-29 MED ORDER — SODIUM CHLORIDE 0.9 % IV SOLN
2400.0000 mg/m2 | INTRAVENOUS | Status: DC
  Administered 2016-03-29: 4950 mg via INTRAVENOUS
  Filled 2016-03-29: qty 99

## 2016-03-29 MED ORDER — DEXTROSE 5 % IV SOLN
Freq: Once | INTRAVENOUS | Status: AC
  Administered 2016-03-29: 10:00:00 via INTRAVENOUS
  Filled 2016-03-29: qty 1000

## 2016-03-29 NOTE — Progress Notes (Signed)
[place] NOTE  Patient Care Team: Marden Noble, MD as PCP - General (Internal Medicine) Marden Noble, MD (Internal Medicine) Christene Lye, MD (General Surgery)  CHIEF COMPLAINTS/PURPOSE OF CONSULTATION:  Oncology History   # June-July 2017-Ascending COLON CA STAGE III [pT3pN2 (4/14LN positive); POSITIVE RADIAL MARGIN; Dr.Sankar]; pre-op CEA- 1.5/N; MRI- liver-NEG; 03/26/2016- PET-NED   # IDA-    # MOLECULAR TESTING: MSI-STABLE; F-ONE-7/10     Cancer of ascending colon ([place] [facility])   02/26/2016 Initial Diagnosis    Cancer of ascending colon (Philip Shah)       HISTORY OF PRESENTING ILLNESS:  Philip Shah 60 y.o.  male above history of stage III colon cancer is here to start FOLFOX chemotherapy.  No nausea no vomiting. Appetite is good. No chest pain or shortness of breath or cough. He continues on iron pills.   ROS: A complete 10 point review of system is done which is negative except mentioned above in history of present illness. Mild tingling and numbness of the tips of toes.   MEDICAL HISTORY:  Past Medical History:  Diagnosis Date  . Abnormal colonoscopy 02-2016  . Anemia   . Enlarged prostate   . GERD (gastroesophageal reflux disease)   . H/O vasectomy 16  . Hemorrhoids   . Hx of ligation of vein 1981  . Sciatica   . Status post colon resection 02-26-16    SURGICAL HISTORY: Past Surgical History:  Procedure Laterality Date  . APPENDECTOMY    . COLONOSCOPY WITH PROPOFOL N/A 02/11/2016   Procedure: COLONOSCOPY WITH PROPOFOL;  Surgeon: Christene Lye, MD;  Location: [facility] ENDOSCOPY;  Service: Endoscopy;  Laterality: N/A;  . ESOPHAGOGASTRODUODENOSCOPY (EGD) WITH PROPOFOL N/A 02/11/2016   Procedure: ESOPHAGOGASTRODUODENOSCOPY (EGD) WITH PROPOFOL;  Surgeon: Christene Lye, MD;  Location: [facility] ENDOSCOPY;  Service: Endoscopy;  Laterality: N/A;  . LAPAROSCOPIC RIGHT COLECTOMY Right 02/26/2016   Procedure: LAPAROSCOPIC RIGHT  COLECTOMY;  Surgeon: Christene Lye, MD;  Location: [facility] ORS;  Service: General;  Laterality: Right;  . PORTACATH PLACEMENT N/A 03/18/2016   Procedure: INSERTION PORT-A-CATH;  Surgeon: Christene Lye, MD;  Location: [facility] ORS;  Service: General;  Laterality: N/A;  . VASECTOMY    . 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 Marital status: Single    Spouse name: N/A  . Number of children: N/A  . Years of education: N/A   Occupational History  . Not on file.   Social History Main Topics  . Smoking status: Never Smoker  . Smokeless tobacco: Never Used  . Alcohol use 0.0 oz/week     Comment: 3/day  . Drug use: No  . Sexual activity: Not on file   Other Topics Concern  . Not on file   Social History Narrative  . No narrative on file    FAMILY HISTORY: mat aunt- anal cancer; oldest brother/pre-cancerous polyps- [Dr. In [place]] Family History  Problem Relation Age of Onset  . Kidney failure Mother   . Cancer Father     spine    ALLERGIES:  is allergic to penicillins.  MEDICATIONS:  Current Outpatient Prescriptions  Medication Sig Dispense Refill  . ferrous sulfate (FERROUSUL) 325 (65 FE) MG tablet Take 1 tablet (325 mg total) by mouth daily with breakfast. 30 tablet 3  . lidocaine-prilocaine (EMLA) cream Apply 1  application topically as needed. Apply generously over the Mediport 45 minutes prior to chemotherapy. 30 g 0  . Multiple Vitamin (MULTIVITAMIN WITH MINERALS) TABS tablet Take 1 tablet by mouth daily.    . ondansetron (ZOFRAN) 8 MG tablet Take 1 tablet (8 mg total) by mouth every 8 (eight) hours as needed for nausea or vomiting (start 3 days; after chemo). (Patient not taking: Reported on 03/29/2016) 40 tablet 1  . prochlorperazine (COMPAZINE) 10 MG tablet Take 1 tablet (10 mg total) by mouth every 6 (six) hours as  needed for nausea or vomiting. (Patient not taking: Reported on 03/29/2016) 40 tablet 1  . traMADol (ULTRAM) 50 MG tablet Take 1 tablet (50 mg total) by mouth every 6 (six) hours as needed. (Patient not taking: Reported on 03/29/2016) 30 tablet 0   No current facility-administered medications for this visit.       Marland Kitchen  PHYSICAL EXAMINATION: ECOG PERFORMANCE STATUS: 0 - Asymptomatic  Vitals:   03/29/16 0843  BP: 136/86  Pulse: 94  Resp: 18  Temp: 98.6 F (37 C)   Filed Weights   03/29/16 0843  Weight: 198 lb (89.8 kg)    GENERAL: Well-nourished well-developed; Alert, no distress and comfortable. Accompanied by his brother.  EYES: no pallor or icterus OROPHARYNX: no thrush or ulceration; good dentition  NECK: supple, no masses felt LYMPH:  no palpable lymphadenopathy in the cervical, axillary or inguinal regions LUNGS: clear to auscultation and  No wheeze or crackles HEART/CVS: regular rate & rhythm and no murmurs; No lower extremity edema ABDOMEN: abdomen soft, non-tender and normal bowel sounds; laparoscopic incision healing well.  Musculoskeletal:no cyanosis of digits and no clubbing  PSYCH: alert & oriented x 3 with fluent speech NEURO: no focal motor/sensory deficits SKIN:  no rashes or significant lesions  LABORATORY DATA:  I have reviewed the data as listed Lab Results  Component Value Date   WBC 9.8 03/29/2016   HGB 10.7 (L) 03/29/2016   HCT 34.2 (L) 03/29/2016   MCV 64.9 (L) 03/29/2016   PLT 260 03/29/2016    Recent Labs  02/18/16 0852  02/27/16 0455 03/15/16 1220 03/29/16 0823  NA 136  --  134* 135 138  K 4.3  --  4.2 4.0 3.8  CL 103  --  105 102 107  CO2 27  --  _0 GLUCOSE 119*  --  136* 88 125*  BUN 14  --  _1 CREATININE 0.84  < > 0.75 0.73 0.83  CALCIUM 8.6*  --  7.8* 8.7* 8.9  GFRNONAA >60  < > >60 >60 >60  GFRAA >60  < > >60 >60 >60  PROT 7.0  --   --  7.6 7.5  ALBUMIN 3.9  --   --  4.2 4.2  AST 19  --   --  21 21  ALT 19  --    --  18 18  ALKPHOS 71  --   --  83 69  BILITOT 0.5  --   --  0.5 0.5  < > = values in this interval not displayed.  RADIOGRAPHIC STUDIES: I have personally reviewed the radiological images as listed and agreed with the findings in the report. Mr Liver W Wo Contrast  Result Date: 03/17/2016 CLINICAL DATA:  Colon cancer, status post resection. Evaluate liver lesions on CT. EXAM: MRI ABDOMEN WITHOUT AND WITH CONTRAST TECHNIQUE: Multiplanar multisequence MR imaging of the abdomen was performed both before and after the administration  of intravenous contrast. CONTRAST:  51m MULTIHANCE GADOBENATE DIMEGLUMINE 529 MG/ML IV SOLN COMPARISON:  CT abdomen pelvis dated 02/18/2016 FINDINGS: Lower chest:  Lung bases are clear. Hepatobiliary: Scattered tiny hepatic cysts, including a dominant 6 mm cyst in segment 4A (series 16/ image 12), corresponding to the CT abnormality. No suspicious/enhancing hepatic lesions. Gallbladder is unremarkable. No intrahepatic or extrahepatic ductal dilatation. Pancreas: Within normal limits. Spleen: Within normal limits. Adrenals/Urinary Tract: Adrenal glands are within normal limits. Kidneys are within normal limits.  No hydronephrosis. Stomach/Bowel: Stomach is within normal limits. Visualized bowel is notable for postsurgical changes related to right hemicolectomy. Vascular/Lymphatic: No evidence abdominal aortic aneurysm. No suspicious abdominal lymphadenopathy. Other: No abdominal ascites. Stranding in the right upper abdominal mesentery, inferior to the liver. Musculoskeletal: No focal osseous lesions. IMPRESSION: Scattered hepatic cysts, including a dominant 6 mm cyst in segment 4A, corresponding to the CT abnormality. No suspicious/enhancing hepatic lesions. Postsurgical changes related to right hemicolectomy. Electronically Signed   By: SJulian HyM.D.   On: 03/17/2016 16:51   Nm Pet Image Initial (pi) Skull Base To Thigh  Result Date: 03/26/2016 CLINICAL DATA:   Initial treatment strategy for colon cancer. History of surgical resection, ascending colon. EXAM: NUCLEAR MEDICINE PET SKULL BASE TO THIGH TECHNIQUE: 12.26 mCi F-18 FDG was injected intravenously. Full-ring PET imaging was performed from the skull base to thigh after the radiotracer. CT data was obtained and used for attenuation correction and anatomic localization. FASTING BLOOD GLUCOSE:  Value: 97 mg/dl COMPARISON:  CT scan 02/18/2016 MRI 03/17/2016 FINDINGS: NECK No hypermetabolic lymph nodes in the neck. CHEST No hypermetabolic mediastinal or hilar nodes. No suspicious pulmonary nodules on the CT scan. ABDOMEN/PELVIS Postoperative changes from a right hemicolectomy. Vague area hypermetabolism in the omental fat near the gallbladder without discrete soft tissue lesion seen on CT scan. SUV max is 4.75. This is likely postoperative scarring change. A small implant is possible but unlikely. Recommend attention on future studies. No mesenteric or retroperitoneal adenopathy or hypermetabolism. No suspicious liver lesions. SKELETON No focal hypermetabolic activity to suggest skeletal metastasis. IMPRESSION: 1. Postoperative changes from a right hemicolectomy without findings for residual tumor or metastatic disease. There is one small focus of hypermetabolism near the gallbladder without CT correlate. This is likely surgical scarring change but attention on future scans is suggested. 2. No other significant findings. Electronically Signed   By: PMarijo SanesM.D.   On: 03/26/2016 11:27   Dg Chest Port 1 View  Result Date: 03/18/2016 CLINICAL DATA:  Catheter placement EXAM: PORTABLE CHEST 1 VIEW COMPARISON:  12/08/2011 FINDINGS: Left subclavian vein Port-A-Cath has been placed. Tip is at the cavoatrial junction. No pneumothorax. Normal heart size. Lungs under aerated and clear. IMPRESSION: Left subclavian vein Port-A-Cath placed with its tip at the cavoatrial junction and no pneumothorax. Electronically Signed    By: AMarybelle KillingsM.D.   On: 03/18/2016 09:18   Dg C-arm 1-60 Min-no Report  Result Date: 03/18/2016 CLINICAL DATA: port a cath C-ARM 1-60 MINUTES Fluoroscopy was utilized by the requesting physician.  No radiographic interpretation.    ASSESSMENT & PLAN:   [place] colon ([place] # Ascending colon cancer; stage III. Recommend adjuvant chemotherapy with FOLFOX every 2 weeks 12. Given the positive margin- at surgery question need for radiation.  MRI liver/skin negative for distention disease.  # Proceed with FOLFOX chemotherapy cycle #1 today. Again reviewed the potential side effects.  # Iron deficiency anemia- hemoglobin 9; on by mouth iron causing constipation. Recommend  iron studies today/CBC today- if low recommend IV iron; discussed infusion reaction.  # Follow-up with me in 2 weeks with chemotherapy.       Cammie Sickle, MD 03/29/2016 6:47 PM

## 2016-03-29 NOTE — Assessment & Plan Note (Addendum)
#   Ascending colon cancer; stage III. Recommend adjuvant chemotherapy with FOLFOX every 2 weeks 12. Given the positive margin- at surgery question need for radiation.  MRI liver/skin negative for distention disease.  # Proceed with FOLFOX chemotherapy cycle #1 today. Again reviewed the potential side effects.  # Iron deficiency anemia- hemoglobin 9; on by mouth iron causing constipation. Recommend iron studies today/CBC today- if low recommend IV iron; discussed infusion reaction.  # Follow-up with me in 2 weeks with chemotherapy.

## 2016-03-29 NOTE — Progress Notes (Signed)
Pt's only concern is PET scan results.

## 2016-03-30 ENCOUNTER — Other Ambulatory Visit: Payer: Self-pay

## 2016-03-30 DIAGNOSIS — C182 Malignant neoplasm of ascending colon: Secondary | ICD-10-CM

## 2016-03-30 LAB — SURGICAL PATHOLOGY

## 2016-03-31 ENCOUNTER — Inpatient Hospital Stay: Payer: BLUE CROSS/BLUE SHIELD

## 2016-03-31 VITALS — BP 116/74 | HR 84 | Temp 98.1°F | Resp 18

## 2016-03-31 DIAGNOSIS — C182 Malignant neoplasm of ascending colon: Secondary | ICD-10-CM | POA: Diagnosis not present

## 2016-03-31 DIAGNOSIS — D500 Iron deficiency anemia secondary to blood loss (chronic): Secondary | ICD-10-CM

## 2016-03-31 MED ORDER — SODIUM CHLORIDE 0.9% FLUSH
10.0000 mL | INTRAVENOUS | Status: DC | PRN
  Administered 2016-03-31: 10 mL
  Filled 2016-03-31: qty 10

## 2016-03-31 MED ORDER — SODIUM CHLORIDE 0.9 % IV SOLN
Freq: Once | INTRAVENOUS | Status: AC
  Administered 2016-03-31: 14:00:00 via INTRAVENOUS
  Filled 2016-03-31: qty 1000

## 2016-03-31 MED ORDER — SODIUM CHLORIDE 0.9 % IV SOLN
200.0000 mg | Freq: Once | INTRAVENOUS | Status: AC
  Administered 2016-03-31: 200 mg via INTRAVENOUS
  Filled 2016-03-31: qty 10

## 2016-03-31 MED ORDER — HEPARIN SOD (PORK) LOCK FLUSH 100 UNIT/ML IV SOLN
500.0000 [IU] | Freq: Once | INTRAVENOUS | Status: AC | PRN
  Administered 2016-03-31: 500 [IU]
  Filled 2016-03-31: qty 5

## 2016-04-06 ENCOUNTER — Encounter: Payer: Self-pay | Admitting: Internal Medicine

## 2016-04-07 NOTE — Progress Notes (Unsigned)
PSN met with patient and his brother to discuss Social Security Disability, assistance with medical bills and assistance with monthly expenses.

## 2016-04-12 ENCOUNTER — Inpatient Hospital Stay: Payer: BLUE CROSS/BLUE SHIELD

## 2016-04-12 ENCOUNTER — Inpatient Hospital Stay: Payer: BLUE CROSS/BLUE SHIELD | Attending: Internal Medicine | Admitting: Internal Medicine

## 2016-04-12 VITALS — BP 131/79 | HR 73 | Temp 98.1°F | Resp 18 | Wt 195.4 lb

## 2016-04-12 DIAGNOSIS — Z809 Family history of malignant neoplasm, unspecified: Secondary | ICD-10-CM | POA: Diagnosis not present

## 2016-04-12 DIAGNOSIS — Z79899 Other long term (current) drug therapy: Secondary | ICD-10-CM | POA: Insufficient documentation

## 2016-04-12 DIAGNOSIS — K219 Gastro-esophageal reflux disease without esophagitis: Secondary | ICD-10-CM | POA: Diagnosis not present

## 2016-04-12 DIAGNOSIS — C182 Malignant neoplasm of ascending colon: Secondary | ICD-10-CM

## 2016-04-12 DIAGNOSIS — Z800 Family history of malignant neoplasm of digestive organs: Secondary | ICD-10-CM

## 2016-04-12 DIAGNOSIS — K649 Unspecified hemorrhoids: Secondary | ICD-10-CM | POA: Insufficient documentation

## 2016-04-12 DIAGNOSIS — D509 Iron deficiency anemia, unspecified: Secondary | ICD-10-CM | POA: Insufficient documentation

## 2016-04-12 DIAGNOSIS — R197 Diarrhea, unspecified: Secondary | ICD-10-CM | POA: Diagnosis not present

## 2016-04-12 DIAGNOSIS — N400 Enlarged prostate without lower urinary tract symptoms: Secondary | ICD-10-CM

## 2016-04-12 DIAGNOSIS — Z5111 Encounter for antineoplastic chemotherapy: Secondary | ICD-10-CM | POA: Insufficient documentation

## 2016-04-12 DIAGNOSIS — D649 Anemia, unspecified: Secondary | ICD-10-CM | POA: Insufficient documentation

## 2016-04-12 DIAGNOSIS — R5383 Other fatigue: Secondary | ICD-10-CM | POA: Diagnosis not present

## 2016-04-12 DIAGNOSIS — K769 Liver disease, unspecified: Secondary | ICD-10-CM | POA: Diagnosis not present

## 2016-04-12 LAB — CBC WITH DIFFERENTIAL/PLATELET
BASOS PCT: 1 %
Basophils Absolute: 0.1 10*3/uL (ref 0–0.1)
Eosinophils Absolute: 0.1 10*3/uL (ref 0–0.7)
Eosinophils Relative: 1 %
HEMATOCRIT: 35.5 % — AB (ref 40.0–52.0)
HEMOGLOBIN: 11.2 g/dL — AB (ref 13.0–18.0)
LYMPHS ABS: 1.6 10*3/uL (ref 1.0–3.6)
Lymphocytes Relative: 22 %
MCH: 20.8 pg — ABNORMAL LOW (ref 26.0–34.0)
MCHC: 31.5 g/dL — AB (ref 32.0–36.0)
MCV: 66.2 fL — ABNORMAL LOW (ref 80.0–100.0)
MONOS PCT: 9 %
Monocytes Absolute: 0.7 10*3/uL (ref 0.2–1.0)
NEUTROS ABS: 5 10*3/uL (ref 1.4–6.5)
NEUTROS PCT: 67 %
Platelets: 238 10*3/uL (ref 150–440)
RBC: 5.36 MIL/uL (ref 4.40–5.90)
RDW: 25.4 % — ABNORMAL HIGH (ref 11.5–14.5)
WBC: 7.5 10*3/uL (ref 3.8–10.6)

## 2016-04-12 LAB — COMPREHENSIVE METABOLIC PANEL
ALBUMIN: 4 g/dL (ref 3.5–5.0)
ALK PHOS: 72 U/L (ref 38–126)
ALT: 22 U/L (ref 17–63)
ANION GAP: 3 — AB (ref 5–15)
AST: 25 U/L (ref 15–41)
BILIRUBIN TOTAL: 0.6 mg/dL (ref 0.3–1.2)
BUN: 9 mg/dL (ref 6–20)
CALCIUM: 8.7 mg/dL — AB (ref 8.9–10.3)
CO2: 27 mmol/L (ref 22–32)
CREATININE: 0.79 mg/dL (ref 0.61–1.24)
Chloride: 106 mmol/L (ref 101–111)
GFR calc Af Amer: >60 mL/min (ref >60)
GFR calc non Af Amer: >60 mL/min (ref >60)
GLUCOSE: 119 mg/dL — AB (ref 65–99)
Potassium: 4.2 mmol/L (ref 3.5–5.1)
Sodium: 136 mmol/L (ref 135–145)
TOTAL PROTEIN: 6.9 g/dL (ref 6.5–8.1)

## 2016-04-12 MED ORDER — DEXTROSE 5 % IV SOLN
Freq: Once | INTRAVENOUS | Status: AC
  Administered 2016-04-12: 10:00:00 via INTRAVENOUS
  Filled 2016-04-12: qty 1000

## 2016-04-12 MED ORDER — SODIUM CHLORIDE 0.9 % IV SOLN
2400.0000 mg/m2 | INTRAVENOUS | Status: DC
  Administered 2016-04-12: 4950 mg via INTRAVENOUS
  Filled 2016-04-12: qty 99

## 2016-04-12 MED ORDER — FLUOROURACIL CHEMO INJECTION 2.5 GM/50ML
400.0000 mg/m2 | Freq: Once | INTRAVENOUS | Status: AC
  Administered 2016-04-12: 800 mg via INTRAVENOUS
  Filled 2016-04-12: qty 16

## 2016-04-12 MED ORDER — OXALIPLATIN CHEMO INJECTION 100 MG/20ML
85.0000 mg/m2 | Freq: Once | INTRAVENOUS | Status: AC
  Administered 2016-04-12: 175 mg via INTRAVENOUS
  Filled 2016-04-12: qty 35

## 2016-04-12 MED ORDER — SODIUM CHLORIDE 0.9% FLUSH
10.0000 mL | INTRAVENOUS | Status: DC | PRN
  Administered 2016-04-12: 10 mL
  Filled 2016-04-12: qty 10

## 2016-04-12 MED ORDER — HEPARIN SOD (PORK) LOCK FLUSH 100 UNIT/ML IV SOLN: 500.0000 [IU] | Freq: Once | INTRAVENOUS | Status: DC | PRN

## 2016-04-12 MED ORDER — LEUCOVORIN CALCIUM INJECTION 350 MG
800.0000 mg | Freq: Once | INTRAVENOUS | Status: AC
  Administered 2016-04-12: 800 mg via INTRAVENOUS
  Filled 2016-04-12: qty 25

## 2016-04-12 MED ORDER — PALONOSETRON HCL INJECTION 0.25 MG/5ML
0.2500 mg | Freq: Once | INTRAVENOUS | Status: AC
  Administered 2016-04-12: 0.25 mg via INTRAVENOUS
  Filled 2016-04-12: qty 5

## 2016-04-12 MED ORDER — SODIUM CHLORIDE 0.9 % IV SOLN
10.0000 mg | Freq: Once | INTRAVENOUS | Status: AC
  Administered 2016-04-12: 10 mg via INTRAVENOUS
  Filled 2016-04-12: qty 1

## 2016-04-12 NOTE — Progress Notes (Signed)
[place] NOTE  Patient Care Team: Marden Noble, MD as PCP - General (Internal Medicine) Marden Noble, MD (Internal Medicine) Christene Lye, MD (General Surgery)  CHIEF COMPLAINTS/PURPOSE OF CONSULTATION:  Oncology History   # June-July 2017-Ascending COLON CA STAGE III [pT3pN2 (4/14LN positive); POSITIVE RADIAL MARGIN; Dr.Sankar]; pre-op CEA- 1.5/N; MRI- liver-NEG; 03/26/2016- PET-NED   # June 24th 2017-  FOLFOX q 2 W  # IDA-    # MOLECULAR TESTING: MSI-STABLE; F-ONE-[july 2017] B- RAF V 600 E; Wild type [place] ; others**     Cancer of ascending colon ([place])   02/26/2016 Initial Diagnosis    Cancer of ascending colon ([facility])       HISTORY OF PRESENTING ILLNESS:  Philip Shah 60 y.o.  male above history of stage III colon cancer is Currently on adjuvant FOLFOX.  Denies any tingling or numbness. No chest pain or shortness of breath or cough. Complains of mild fatigue. Otherwise improving. No nausea no vomiting.  ROS: A complete 10 point review of system is done which is negative except mentioned above in history of present illness.  MEDICAL HISTORY:  Past Medical History:  Diagnosis Date  . Abnormal colonoscopy 02-2016  . Anemia   . Enlarged prostate   . GERD (gastroesophageal reflux disease)   . H/O vasectomy 23  . Hemorrhoids   . Hx of ligation of vein 1981  . Sciatica   . Status post colon resection 02-26-16    SURGICAL HISTORY: Past Surgical History:  Procedure Laterality Date  . APPENDECTOMY    . COLONOSCOPY WITH PROPOFOL N/A 02/11/2016   Procedure: COLONOSCOPY WITH PROPOFOL;  Surgeon: Christene Lye, MD;  Location: [facility] END[facility];  Service: Endoscopy;  Laterality: N/A;  . ESOPHAGOGASTRODUODENOSCOPY (EGD) WITH PROPOFOL N/A 02/11/2016   Procedure: ESOPHAGOGASTRODUODENOSCOPY (EGD) WITH PROPOFOL;  Surgeon: Christene Lye, MD;  Location: [facility] END[facility];  Service: Endoscopy;  Laterality: N/A;  . LAPAROSCOPIC  RIGHT COLECTOMY Right 02/26/2016   Procedure: LAPAROSCOPIC RIGHT COLECTOMY;  Surgeon: Christene Lye, MD;  Location: [facility] ORS;  Service: General;  Laterality: Right;  . PORTACATH PLACEMENT N/A 03/18/2016   Procedure: INSERTION PORT-A-CATH;  Surgeon: Christene Lye, MD;  Location: [facility] ORS;  Service: General;  Laterality: N/A;  . VASECTOMY    . 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 Marital status: Single    Spouse name: N/A  . Number of children: N/A  . Years of education: N/A   Occupational History  . Not on file.   Social History Main Topics  . Smoking status: Never Smoker  . Smokeless tobacco: Never Used  . Alcohol use 0.0 oz/week     Comment: 3/day  . Drug use: No  . Sexual activity: Not on file   Other Topics Concern  . Not on file   Social History Narrative  . No narrative on file    FAMILY HISTORY: mat aunt- anal cancer; oldest brother/pre-cancerous polyps- [Dr. In [place]] Family History  Problem Relation Age of Onset  . Kidney failure Mother   . Cancer Father     spine    ALLERGIES:  is allergic to penicillins.  MEDICATIONS:  Current Outpatient Prescriptions  Medication Sig Dispense Refill  . ferrous sulfate (FERROUSUL) 325 (65 FE) MG tablet Take 1 tablet (325 mg total) by mouth daily  with breakfast. 30 tablet 3  . lidocaine-prilocaine (EMLA) cream Apply 1 application topically as needed. Apply generously over the Mediport 45 minutes prior to chemotherapy. 30 g 0  . Multiple Vitamin (MULTIVITAMIN WITH MINERALS) TABS tablet Take 1 tablet by mouth daily.    . ondansetron (ZOFRAN) 8 MG tablet Take 1 tablet (8 mg total) by mouth every 8 (eight) hours as needed for nausea or vomiting (start 3 days; after chemo). 40 tablet 1  . prochlorperazine (COMPAZINE) 10 MG tablet Take 1 tablet (10 mg total) by mouth  every 6 (six) hours as needed for nausea or vomiting. 40 tablet 1  . traMADol (ULTRAM) 50 MG tablet Take 1 tablet (50 mg total) by mouth every 6 (six) hours as needed. 30 tablet 0   No current facility-administered medications for this visit.    Facility-Administered Medications Ordered in Other Visits  Medication Dose Route Frequency Provider Last Rate Last Dose  . fluorouracil (ADRUCIL) 4,950 mg in sodium chloride 0.9 % 51 mL chemo infusion  2,400 mg/m2 (Treatment Plan Recorded) Intravenous 1 day or 1 dose Cammie Sickle, MD   4,950 mg at 04/12/16 1225  . heparin lock flush 100 unit/mL  500 Units Intracatheter Once PRN Cammie Sickle, MD      . sodium chloride flush (NS) 0.9 % injection 10 mL  10 mL Intracatheter PRN Cammie Sickle, MD   10 mL at 04/12/16 0940      .  PHYSICAL EXAMINATION: ECOG PERFORMANCE STATUS: 0 - Asymptomatic  Vitals:   04/12/16 0847  BP: 131/79  Pulse: 73  Resp: 18  Temp: 98.1 F (36.7 C)   Filed Weights   04/12/16 0847  Weight: 195 lb 6 oz (88.6 kg)    GENERAL: Well-nourished well-developed; Alert, no distress and comfortable. He is alone. EYES: no pallor or icterus OROPHARYNX: no thrush or ulceration; good dentition  NECK: supple, no masses felt LYMPH:  no palpable lymphadenopathy in the cervical, axillary or inguinal regions LUNGS: clear to auscultation and  No wheeze or crackles HEART/CVS: regular rate & rhythm and no murmurs; No lower extremity edema ABDOMEN: abdomen soft, non-tender and normal bowel sounds.  Musculoskeletal:no cyanosis of digits and no clubbing  PSYCH: alert & oriented x 3 with fluent speech NEURO: no focal motor/sensory deficits SKIN:  no rashes or significant lesions  LABORATORY DATA:  I have reviewed the data as listed Lab Results  Component Value Date   WBC 7.5 04/12/2016   HGB 11.2 (L) 04/12/2016   HCT 35.5 (L) 04/12/2016   MCV 66.2 (L) 04/12/2016   PLT 238 04/12/2016    Recent Labs   03/15/16 1220 03/29/16 0823 04/12/16 0830  NA 135 138 136  K 4.0 3.8 4.2  CL 102 107 106  CO2 _0 GLUCOSE 88 125* 119*  BUN _1 CREATININE 0.73 0.83 0.79  CALCIUM 8.7* 8.9 8.7*  GFRNONAA >60 >60 >60  GFRAA >60 >60 >60  PROT 7.6 7.5 6.9  ALBUMIN 4.2 4.2 4.0  AST _2 ALT _3 ALKPHOS 83 69 72  BILITOT 0.5 0.5 0.6    RADIOGRAPHIC STUDIES: I have personally reviewed the radiological images as listed and agreed with the findings in the report. Mr Liver W Wo Contrast  Result Date: 03/17/2016 CLINICAL DATA:  Colon cancer, status post resection. Evaluate liver lesions on CT. EXAM: MRI ABDOMEN WITHOUT AND WITH CONTRAST TECHNIQUE: Multiplanar multisequence MR imaging of the abdomen was  performed both before and after the administration of intravenous contrast. CONTRAST:  28m MULTIHANCE GADOBENATE DIMEGLUMINE 529 MG/ML IV SOLN COMPARISON:  CT abdomen pelvis dated 02/18/2016 FINDINGS: Lower chest:  Lung bases are clear. Hepatobiliary: Scattered tiny hepatic cysts, including a dominant 6 mm cyst in segment 4A (series 16/ image 12), corresponding to the CT abnormality. No suspicious/enhancing hepatic lesions. Gallbladder is unremarkable. No intrahepatic or extrahepatic ductal dilatation. Pancreas: Within normal limits. Spleen: Within normal limits. Adrenals/Urinary Tract: Adrenal glands are within normal limits. Kidneys are within normal limits.  No hydronephrosis. Stomach/Bowel: Stomach is within normal limits. Visualized bowel is notable for postsurgical changes related to right hemicolectomy. Vascular/Lymphatic: No evidence abdominal aortic aneurysm. No suspicious abdominal lymphadenopathy. Other: No abdominal ascites. Stranding in the right upper abdominal mesentery, inferior to the liver. Musculoskeletal: No focal osseous lesions. IMPRESSION: Scattered hepatic cysts, including a dominant 6 mm cyst in segment 4A, corresponding to the CT abnormality. No suspicious/enhancing  hepatic lesions. Postsurgical changes related to right hemicolectomy. Electronically Signed   By: SJulian HyM.D.   On: 03/17/2016 16:51   Nm Pet Image Initial (pi) Skull Base To Thigh  Result Date: 03/26/2016 CLINICAL DATA:  Initial treatment strategy for colon cancer. History of surgical resection, ascending colon. EXAM: NUCLEAR MEDICINE PET SKULL BASE TO THIGH TECHNIQUE: 12.26 mCi F-18 FDG was injected intravenously. Full-ring PET imaging was performed from the skull base to thigh after the radiotracer. CT data was obtained and used for attenuation correction and anatomic localization. FASTING BLOOD GLUCOSE:  Value: 97 mg/dl COMPARISON:  CT scan 02/18/2016 MRI 03/17/2016 FINDINGS: NECK No hypermetabolic lymph nodes in the neck. CHEST No hypermetabolic mediastinal or hilar nodes. No suspicious pulmonary nodules on the CT scan. ABDOMEN/PELVIS Postoperative changes from a right hemicolectomy. Vague area hypermetabolism in the omental fat near the gallbladder without discrete soft tissue lesion seen on CT scan. SUV max is 4.75. This is likely postoperative scarring change. A small implant is possible but unlikely. Recommend attention on future studies. No mesenteric or retroperitoneal adenopathy or hypermetabolism. No suspicious liver lesions. SKELETON No focal hypermetabolic activity to suggest skeletal metastasis. IMPRESSION: 1. Postoperative changes from a right hemicolectomy without findings for residual tumor or metastatic disease. There is one small focus of hypermetabolism near the gallbladder without CT correlate. This is likely surgical scarring change but attention on future scans is suggested. 2. No other significant findings. Electronically Signed   By: PMarijo SanesM.D.   On: 03/26/2016 11:27   Dg Chest Port 1 View  Result Date: 03/18/2016 CLINICAL DATA:  Catheter placement EXAM: PORTABLE CHEST 1 VIEW COMPARISON:  12/08/2011 FINDINGS: Left subclavian vein Port-A-Cath has been placed. Tip  is at the cavoatrial junction. No pneumothorax. Normal heart size. Lungs under aerated and clear. IMPRESSION: Left subclavian vein Port-A-Cath placed with its tip at the cavoatrial junction and no pneumothorax. Electronically Signed   By: AMarybelle KillingsM.D.   On: 03/18/2016 09:18   Dg C-arm 1-60 Min-no Report  Result Date: 03/18/2016 CLINICAL DATA: port a cath C-ARM 1-60 MINUTES Fluoroscopy was utilized by the requesting physician.  No radiographic interpretation.    ASSESSMENT & PLAN:   Cancer of ascending colon ([place] # Ascending colon cancer; stage III. Recommend adjuvant chemotherapy with FOLFOX every 2 weeks 12. Given the positive margin- at surgery question need for radiation.   # Proceed with FOLFOX chemotherapy cycle #2 today.   # Iron deficiency anemia- hemoglobin 11/improving. Continue weekly Iv iron x4.   # Diarrhea - G-1  on imoidum.  # Follow-up with me in 2 weeks with chemotherapy.      Cammie Sickle, MD 04/12/2016 1:53 PM

## 2016-04-12 NOTE — Assessment & Plan Note (Addendum)
#   Ascending colon cancer; stage III. Recommend adjuvant chemotherapy with FOLFOX every 2 weeks 12. Given the positive margin- at surgery question need for radiation.   # Proceed with FOLFOX chemotherapy cycle #2 today.   # Iron deficiency anemia- hemoglobin 11/improving. Continue weekly Iv iron x4.   # Diarrhea - G-1 on imoidum.  # Follow-up with me in 2 weeks with chemotherapy.

## 2016-04-14 ENCOUNTER — Inpatient Hospital Stay: Payer: BLUE CROSS/BLUE SHIELD

## 2016-04-14 VITALS — BP 122/72 | HR 79 | Temp 96.1°F | Resp 18

## 2016-04-14 DIAGNOSIS — C182 Malignant neoplasm of ascending colon: Secondary | ICD-10-CM

## 2016-04-14 DIAGNOSIS — D500 Iron deficiency anemia secondary to blood loss (chronic): Secondary | ICD-10-CM

## 2016-04-14 MED ORDER — HEPARIN SOD (PORK) LOCK FLUSH 100 UNIT/ML IV SOLN
500.0000 [IU] | Freq: Once | INTRAVENOUS | Status: AC | PRN
  Administered 2016-04-14: 500 [IU]
  Filled 2016-04-14: qty 5

## 2016-04-14 MED ORDER — SODIUM CHLORIDE 0.9 % IV SOLN
Freq: Once | INTRAVENOUS | Status: AC
  Administered 2016-04-14: 13:00:00 via INTRAVENOUS
  Filled 2016-04-14: qty 1000

## 2016-04-14 MED ORDER — SODIUM CHLORIDE 0.9 % IV SOLN
200.0000 mg | Freq: Once | INTRAVENOUS | Status: AC
  Administered 2016-04-14: 200 mg via INTRAVENOUS
  Filled 2016-04-14: qty 10

## 2016-04-14 MED ORDER — SODIUM CHLORIDE 0.9% FLUSH
10.0000 mL | INTRAVENOUS | Status: DC | PRN
  Administered 2016-04-14: 10 mL
  Filled 2016-04-14: qty 10

## 2016-04-19 ENCOUNTER — Ambulatory Visit (INDEPENDENT_AMBULATORY_CARE_PROVIDER_SITE_OTHER): Payer: BLUE CROSS/BLUE SHIELD | Admitting: General Surgery

## 2016-04-19 ENCOUNTER — Encounter: Payer: Self-pay | Admitting: General Surgery

## 2016-04-19 VITALS — BP 164/82 | HR 74 | Resp 12 | Ht 66.0 in | Wt 192.0 lb

## 2016-04-19 DIAGNOSIS — C182 Malignant neoplasm of ascending colon: Secondary | ICD-10-CM

## 2016-04-19 NOTE — Patient Instructions (Addendum)
Patient to return in five months 

## 2016-04-19 NOTE — Progress Notes (Signed)
Patient ID: Philip Shah, male   DOB: 01-May-1956, 60 y.o.   MRN: [id number]  Chief Complaint  Patient presents with  . Routine Post Op    port placement    HPI Philip Shah is a 60 y.o. male here today for a post op follow up on a port placement done on 03/18/16 for Stage IIIB (T3 N2a cM0) colon cancer. Patient states he has had the second of twelve chemotherapy sessions. Also receiving iron infusions and taking oral iron tablets. He is working on an application for temporary disability during chemotherapy. Patient states he is otherwise doing well.  I have reviewed the history of present illness with the patient.  HPI  Past Medical History:  Diagnosis Date  . Abnormal colonoscopy 02-2016  . Anemia   . Enlarged prostate   . GERD (gastroesophageal reflux disease)   . H/O vasectomy 34  . Hemorrhoids   . Hx of ligation of vein 1981  . Sciatica   . Status post colon resection 02-26-16    Past Surgical History:  Procedure Laterality Date  . APPENDECTOMY    . COLONOSCOPY WITH PROPOFOL N/A 02/11/2016   Procedure: COLONOSCOPY WITH PROPOFOL;  Surgeon: Christene Lye, MD;  Location: [facility];  Service: Endoscopy;  Laterality: N/A;  . ESOPHAGOGASTRODUODENOSCOPY (EGD) WITH PROPOFOL N/A 02/11/2016   Procedure: ESOPHAGOGASTRODUODENOSCOPY (EGD) WITH PROPOFOL;  Surgeon: Christene Lye, MD;  Location: [facility];  Service: Endoscopy;  Laterality: N/A;  . LAPAROSCOPIC RIGHT COLECTOMY Right 02/26/2016   Procedure: LAPAROSCOPIC RIGHT COLECTOMY;  Surgeon: Christene Lye, MD;  Location: [facility];  Service: General;  Laterality: Right;  . PORTACATH PLACEMENT N/A 03/18/2016   Procedure: INSERTION PORT-A-CATH;  Surgeon: Christene Lye, MD;  Location: [facility];  Service: General;  Laterality: N/A;  . VASECTOMY    . VEIN LIGATION Right 1980's   Dr Pat Kocher    Family History  Problem Relation Age of Onset  . Kidney failure Mother   . Cancer Father       spine    Social History Social History  Substance Use Topics  . Smoking status: Never Smoker  . Smokeless tobacco: Never Used  . Alcohol use 0.0 oz/week     Comment: 3/day    Allergies  Allergen Reactions  . Penicillins Rash    Rash as a child. Has patient had a PCN reaction causing immediate rash, facial/tongue/throat swelling, SOB or lightheadedness with hypotension: No Has patient had a PCN reaction causing severe rash involving mucus membranes or skin necrosis: No Has patient had a PCN reaction that required hospitalization No Has patient had a PCN reaction occurring within the last 10 years: No If all of the above answers are "NO", then may proceed with Cephalosporin use.     Current Outpatient Prescriptions  Medication Sig Dispense Refill  . ferrous sulfate (FERROUSUL) 325 (65 FE) MG tablet Take 1 tablet (325 mg total) by mouth daily with breakfast. 30 tablet 3  . lidocaine-prilocaine (EMLA) cream Apply 1 application topically as needed. Apply generously over the Mediport 45 minutes prior to chemotherapy. 30 g 0  . Multiple Vitamin (MULTIVITAMIN WITH MINERALS) TABS tablet Take 1 tablet by mouth daily.    . ondansetron (ZOFRAN) 8 MG tablet Take 1 tablet (8 mg total) by mouth every 8 (eight) hours as needed for nausea or vomiting (start 3 days; after chemo). 40 tablet 1  . prochlorperazine (COMPAZINE) 10 MG tablet Take 1 tablet (10 mg total) by mouth every  6 (six) hours as needed for nausea or vomiting. 40 tablet 1  . traMADol (ULTRAM) 50 MG tablet Take 1 tablet (50 mg total) by mouth every 6 (six) hours as needed. 30 tablet 0   No current facility-administered medications for this visit.     Review of Systems Review of Systems  Constitutional: Negative.   Respiratory: Negative.   Cardiovascular: Negative.     Blood pressure (!) 164/82, pulse 74, resp. rate 12, height 5\' 6"  (1.676 m), weight 192 lb (87.1 kg).  Physical Exam Physical Exam  Constitutional: He is  oriented to person, place, and time. He appears well-developed and well-nourished.  Eyes: Conjunctivae are normal. No scleral icterus.  Neck: Neck supple.  Cardiovascular: Normal rate, regular rhythm and normal heart sounds.   Pulmonary/Chest: Effort normal and breath sounds normal.    Abdominal: Soft. Bowel sounds are normal. He exhibits no distension. There is no hepatomegaly. There is no tenderness. No hernia.    Lymphadenopathy:    He has no cervical adenopathy.  Neurological: He is alert and oriented to person, place, and time.  Skin: Skin is warm and dry.    Data Reviewed Prior notes, op notes, cancer center notes  Assessment    Stage IIIB   colon cancer s/p right hemicolectomy, currently receiving chemotherapy every two weeks. Will finish around Christmas time.    Plan   Patient to return in 5 months.   This information has been scribed by Gaspar Cola CMA.        Ciarrah Rae G 04/19/2016, 11:44 AM

## 2016-04-21 ENCOUNTER — Inpatient Hospital Stay: Payer: BLUE CROSS/BLUE SHIELD

## 2016-04-21 VITALS — BP 125/66 | HR 82 | Resp 16

## 2016-04-21 DIAGNOSIS — D500 Iron deficiency anemia secondary to blood loss (chronic): Secondary | ICD-10-CM

## 2016-04-21 DIAGNOSIS — C182 Malignant neoplasm of ascending colon: Secondary | ICD-10-CM | POA: Diagnosis not present

## 2016-04-21 MED ORDER — HEPARIN SOD (PORK) LOCK FLUSH 100 UNIT/ML IV SOLN
INTRAVENOUS | Status: AC
  Filled 2016-04-21: qty 5

## 2016-04-21 MED ORDER — HEPARIN SOD (PORK) LOCK FLUSH 100 UNIT/ML IV SOLN
500.0000 [IU] | Freq: Once | INTRAVENOUS | Status: AC
  Administered 2016-04-21: 500 [IU] via INTRAVENOUS

## 2016-04-21 MED ORDER — SODIUM CHLORIDE 0.9% FLUSH
10.0000 mL | Freq: Once | INTRAVENOUS | Status: AC
  Administered 2016-04-21: 10 mL via INTRAVENOUS
  Filled 2016-04-21: qty 10

## 2016-04-21 MED ORDER — IRON SUCROSE 20 MG/ML IV SOLN
200.0000 mg | Freq: Once | INTRAVENOUS | Status: AC
  Administered 2016-04-21: 200 mg via INTRAVENOUS
  Filled 2016-04-21: qty 10

## 2016-04-21 MED ORDER — SODIUM CHLORIDE 0.9 % IV SOLN
Freq: Once | INTRAVENOUS | Status: AC
  Administered 2016-04-21: 13:00:00 via INTRAVENOUS
  Filled 2016-04-21: qty 1000

## 2016-04-22 ENCOUNTER — Other Ambulatory Visit: Payer: Self-pay | Admitting: *Deleted

## 2016-04-22 MED ORDER — LIDOCAINE-PRILOCAINE 2.5-2.5 % EX CREA: 1.0000 "application " | TOPICAL_CREAM | CUTANEOUS | 0 refills | Status: DC | PRN

## 2016-04-26 ENCOUNTER — Inpatient Hospital Stay: Payer: BLUE CROSS/BLUE SHIELD

## 2016-04-26 ENCOUNTER — Inpatient Hospital Stay (HOSPITAL_BASED_OUTPATIENT_CLINIC_OR_DEPARTMENT_OTHER): Payer: BLUE CROSS/BLUE SHIELD | Admitting: Internal Medicine

## 2016-04-26 VITALS — BP 140/74 | HR 83 | Temp 96.8°F | Resp 18

## 2016-04-26 VITALS — BP 154/84 | HR 84 | Resp 17 | Ht 66.0 in | Wt 194.2 lb

## 2016-04-26 DIAGNOSIS — K219 Gastro-esophageal reflux disease without esophagitis: Secondary | ICD-10-CM

## 2016-04-26 DIAGNOSIS — K649 Unspecified hemorrhoids: Secondary | ICD-10-CM

## 2016-04-26 DIAGNOSIS — Z79899 Other long term (current) drug therapy: Secondary | ICD-10-CM

## 2016-04-26 DIAGNOSIS — Z800 Family history of malignant neoplasm of digestive organs: Secondary | ICD-10-CM

## 2016-04-26 DIAGNOSIS — C182 Malignant neoplasm of ascending colon: Secondary | ICD-10-CM

## 2016-04-26 DIAGNOSIS — R197 Diarrhea, unspecified: Secondary | ICD-10-CM

## 2016-04-26 DIAGNOSIS — R5383 Other fatigue: Secondary | ICD-10-CM

## 2016-04-26 DIAGNOSIS — Z809 Family history of malignant neoplasm, unspecified: Secondary | ICD-10-CM

## 2016-04-26 DIAGNOSIS — K769 Liver disease, unspecified: Secondary | ICD-10-CM

## 2016-04-26 DIAGNOSIS — D509 Iron deficiency anemia, unspecified: Secondary | ICD-10-CM | POA: Diagnosis not present

## 2016-04-26 DIAGNOSIS — D649 Anemia, unspecified: Secondary | ICD-10-CM

## 2016-04-26 DIAGNOSIS — N400 Enlarged prostate without lower urinary tract symptoms: Secondary | ICD-10-CM

## 2016-04-26 LAB — COMPREHENSIVE METABOLIC PANEL
ALBUMIN: 4 g/dL (ref 3.5–5.0)
ALK PHOS: 68 U/L (ref 38–126)
ALT: 23 U/L (ref 17–63)
AST: 27 U/L (ref 15–41)
Anion gap: 7 (ref 5–15)
BILIRUBIN TOTAL: 0.5 mg/dL (ref 0.3–1.2)
BUN: 9 mg/dL (ref 6–20)
CALCIUM: 8.8 mg/dL — AB (ref 8.9–10.3)
CO2: 24 mmol/L (ref 22–32)
Chloride: 105 mmol/L (ref 101–111)
Creatinine, Ser: 0.71 mg/dL (ref 0.61–1.24)
GFR calc Af Amer: >60 mL/min (ref >60)
GFR calc non Af Amer: >60 mL/min (ref >60)
GLUCOSE: 162 mg/dL — AB (ref 65–99)
POTASSIUM: 3.9 mmol/L (ref 3.5–5.1)
SODIUM: 136 mmol/L (ref 135–145)
TOTAL PROTEIN: 7 g/dL (ref 6.5–8.1)

## 2016-04-26 LAB — CBC WITH DIFFERENTIAL/PLATELET
BASOS ABS: 0.1 10*3/uL (ref 0–0.1)
EOS ABS: 0.1 10*3/uL (ref 0–0.7)
Eosinophils Relative: 1 %
HEMATOCRIT: 38.6 % — AB (ref 40.0–52.0)
HEMOGLOBIN: 12.3 g/dL — AB (ref 13.0–18.0)
Lymphocytes Relative: 22 %
Lymphs Abs: 1.4 10*3/uL (ref 1.0–3.6)
MCH: 22.6 pg — ABNORMAL LOW (ref 26.0–34.0)
MCHC: 31.9 g/dL — ABNORMAL LOW (ref 32.0–36.0)
MCV: 70.9 fL — ABNORMAL LOW (ref 80.0–100.0)
Monocytes Absolute: 0.6 10*3/uL (ref 0.2–1.0)
Monocytes Relative: 9 %
NEUTROS ABS: 4.4 10*3/uL (ref 1.4–6.5)
Platelets: 115 10*3/uL — ABNORMAL LOW (ref 150–440)
RBC: 5.44 MIL/uL (ref 4.40–5.90)
RDW: 30.1 % — ABNORMAL HIGH (ref 11.5–14.5)
WBC: 6.5 10*3/uL (ref 3.8–10.6)

## 2016-04-26 MED ORDER — DEXTROSE 5 % IV SOLN
800.0000 mg | Freq: Once | INTRAVENOUS | Status: AC
  Administered 2016-04-26: 800 mg via INTRAVENOUS
  Filled 2016-04-26: qty 40

## 2016-04-26 MED ORDER — FLUOROURACIL CHEMO INJECTION 2.5 GM/50ML
400.0000 mg/m2 | Freq: Once | INTRAVENOUS | Status: AC
  Administered 2016-04-26: 800 mg via INTRAVENOUS
  Filled 2016-04-26: qty 16

## 2016-04-26 MED ORDER — SODIUM CHLORIDE 0.9 % IV SOLN
2400.0000 mg/m2 | INTRAVENOUS | Status: DC
  Administered 2016-04-26: 4950 mg via INTRAVENOUS
  Filled 2016-04-26: qty 99

## 2016-04-26 MED ORDER — OXALIPLATIN CHEMO INJECTION 100 MG/20ML
85.0000 mg/m2 | Freq: Once | INTRAVENOUS | Status: AC
  Administered 2016-04-26: 175 mg via INTRAVENOUS
  Filled 2016-04-26: qty 35

## 2016-04-26 MED ORDER — SODIUM CHLORIDE 0.9% FLUSH
10.0000 mL | INTRAVENOUS | Status: DC | PRN
  Filled 2016-04-26: qty 10

## 2016-04-26 MED ORDER — SODIUM CHLORIDE 0.9% FLUSH
10.0000 mL | INTRAVENOUS | Status: DC | PRN
  Administered 2016-04-26: 10 mL via INTRAVENOUS
  Filled 2016-04-26: qty 10

## 2016-04-26 MED ORDER — PALONOSETRON HCL INJECTION 0.25 MG/5ML
0.2500 mg | Freq: Once | INTRAVENOUS | Status: AC
  Administered 2016-04-26: 0.25 mg via INTRAVENOUS
  Filled 2016-04-26: qty 5

## 2016-04-26 MED ORDER — SODIUM CHLORIDE 0.9 % IV SOLN
10.0000 mg | Freq: Once | INTRAVENOUS | Status: AC
  Administered 2016-04-26: 10 mg via INTRAVENOUS
  Filled 2016-04-26: qty 1

## 2016-04-26 MED ORDER — DEXTROSE 5 % IV SOLN
Freq: Once | INTRAVENOUS | Status: AC
  Administered 2016-04-26: 10:00:00 via INTRAVENOUS
  Filled 2016-04-26: qty 1000

## 2016-04-26 MED ORDER — HEPARIN SOD (PORK) LOCK FLUSH 100 UNIT/ML IV SOLN: 500.0000 [IU] | Freq: Once | INTRAVENOUS | Status: DC

## 2016-04-26 NOTE — Progress Notes (Signed)
No changes since last visit other than pt reports more "Chemo brain".

## 2016-04-26 NOTE — Assessment & Plan Note (Addendum)
#   Ascending colon cancer; stage III on adjuvant chemotherapy with FOLFOX every 2 weeks 12. Given the positive margin- at surgery question need for radiation.   # Proceed with FOLFOX chemotherapy cycle #3 today. Patient tolerating chemotherapy well. Labs are adequate.  # Cold sensitivity/oxaliplatin grade 1  # Iron deficiency anemia- hemoglobin 11/improving. #4 IV venofer this week.   # Follow-up with me in 2 weeks with chemotherapy/lABS.

## 2016-04-26 NOTE — Progress Notes (Signed)
[place] NOTE  Patient Care Team: Marden Noble, MD as PCP - General (Internal Medicine) Marden Noble, MD (Internal Medicine) Christene Lye, MD (General Surgery)  CHIEF COMPLAINTS/PURPOSE OF CONSULTATION:  Oncology History   # June-July 2017-Ascending COLON CA STAGE III [pT3pN2 (4/14LN positive); POSITIVE RADIAL MARGIN; Dr.Sankar]; pre-op CEA- 1.5/N; MRI- liver-NEG; 03/26/2016- PET-NED   # June 24th 2017-  FOLFOX q 2 W  # IDA- [aug 2017] s/p IV venofer x4.    # MOLECULAR TESTING: MSI-STABLE; F-ONE-[july 2017] B- RAF V 600 E; Wild type Kras/N-ras ; others**     Cancer of ascending colon ([place])   02/26/2016 Initial Diagnosis    Cancer of ascending colon ([facility])        HISTORY OF PRESENTING ILLNESS:  Philip Shah 60 y.o.  male above history of stage III colon cancer is Currently on adjuvant FOLFOX.   Complains of cold sensitivity with chemotherapy; otherwise Denies any tingling or numbness. No chest pain or shortness of breath or cough. Complains of mild fatigue. Otherwise improving. No nausea no vomiting. No bleeding.  ROS: A complete 10 point review of system is done which is negative except mentioned above in history of present illness.  MEDICAL HISTORY:  Past Medical History:  Diagnosis Date  . Abnormal colonoscopy 02-2016  . Anemia   . Enlarged prostate   . GERD (gastroesophageal reflux disease)   . H/O vasectomy 76  . Hemorrhoids   . Hx of ligation of vein 1981  . Sciatica   . Status post colon resection 02-26-16    SURGICAL HISTORY: Past Surgical History:  Procedure Laterality Date  . APPENDECTOMY    . COLONOSCOPY WITH PROPOFOL N/A 02/11/2016   Procedure: COLONOSCOPY WITH PROPOFOL;  Surgeon: Christene Lye, MD;  Location: [facility];  Service: Endoscopy;  Laterality: N/A;  . ESOPHAGOGASTRODUODENOSCOPY (EGD) WITH PROPOFOL N/A 02/11/2016   Procedure: ESOPHAGOGASTRODUODENOSCOPY (EGD) WITH PROPOFOL;  Surgeon:  Christene Lye, MD;  Location: [facility];  Service: Endoscopy;  Laterality: N/A;  . LAPAROSCOPIC RIGHT COLECTOMY Right 02/26/2016   Procedure: LAPAROSCOPIC RIGHT COLECTOMY;  Surgeon: Christene Lye, MD;  Location: [facility];  Service: General;  Laterality: Right;  . PORTACATH PLACEMENT N/A 03/18/2016   Procedure: INSERTION PORT-A-CATH;  Surgeon: Christene Lye, MD;  Location: [facility];  Service: General;  Laterality: N/A;  . VASECTOMY    . 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 Marital status: Single    Spouse name: N/A  . Number of children: N/A  . Years of education: N/A   Occupational History  . Not on file.   Social History Main Topics  . Smoking status: Never Smoker  . Smokeless tobacco: Never Used  . Alcohol use 0.0 oz/week     Comment: 3/day  . Drug use: No  . Sexual activity: Not on file   Other Topics Concern  . Not on file   Social History Narrative  . No narrative on file    FAMILY HISTORY: mat aunt- anal cancer; oldest brother/pre-cancerous polyps- [Dr. In [place]] Family History  Problem Relation Age of Onset  . Kidney failure Mother   . Cancer Father     spine    ALLERGIES:  is allergic to penicillins.  MEDICATIONS:  Current Outpatient Prescriptions  Medication Sig Dispense Refill  .  ferrous sulfate (FERROUSUL) 325 (65 FE) MG tablet Take 1 tablet (325 mg total) by mouth daily with breakfast. 30 tablet 3  . lidocaine-prilocaine (EMLA) cream Apply 1 application topically as needed. Apply generously over the Mediport 45 minutes prior to chemotherapy. 30 g 0  . Multiple Vitamin (MULTIVITAMIN WITH MINERALS) TABS tablet Take 1 tablet by mouth daily.    . ondansetron (ZOFRAN) 8 MG tablet Take 1 tablet (8 mg total) by mouth every 8 (eight) hours as needed for nausea or vomiting (start 3 days;  after chemo). (Patient not taking: Reported on 04/26/2016) 40 tablet 1  . prochlorperazine (COMPAZINE) 10 MG tablet Take 1 tablet (10 mg total) by mouth every 6 (six) hours as needed for nausea or vomiting. (Patient not taking: Reported on 04/26/2016) 40 tablet 1  . traMADol (ULTRAM) 50 MG tablet Take 1 tablet (50 mg total) by mouth every 6 (six) hours as needed. (Patient not taking: Reported on 04/26/2016) 30 tablet 0   No current facility-administered medications for this visit.       Marland Kitchen  PHYSICAL EXAMINATION: ECOG PERFORMANCE STATUS: 0 - Asymptomatic  Vitals:   04/26/16 0836  BP: (!) 154/84  Pulse: 84  Resp: 17   Filed Weights   04/26/16 0836  Weight: 194 lb 3.2 oz (88.1 kg)    GENERAL: Well-nourished well-developed; Alert, no distress and comfortable. He is alone. EYES: no pallor or icterus OROPHARYNX: no thrush or ulceration; good dentition  NECK: supple, no masses felt LYMPH:  no palpable lymphadenopathy in the cervical, axillary or inguinal regions LUNGS: clear to auscultation and  No wheeze or crackles HEART/CVS: regular rate & rhythm and no murmurs; No lower extremity edema ABDOMEN: abdomen soft, non-tender and normal bowel sounds.  Musculoskeletal:no cyanosis of digits and no clubbing  PSYCH: alert & oriented x 3 with fluent speech NEURO: no focal motor/sensory deficits SKIN:  no rashes or significant lesions  LABORATORY DATA:  I have reviewed the data as listed Lab Results  Component Value Date   WBC 6.5 04/26/2016   HGB 12.3 (L) 04/26/2016   HCT 38.6 (L) 04/26/2016   MCV 70.9 (L) 04/26/2016   PLT 115 (L) 04/26/2016    Recent Labs  03/29/16 0823 04/12/16 0830 04/26/16 0809  NA 138 136 136  K 3.8 4.2 3.9  CL 107 106 105  CO2 _0 GLUCOSE 125* 119* 162*  BUN _1 CREATININE 0.83 0.79 0.71  CALCIUM 8.9 8.7* 8.8*  GFRNONAA >60 >60 >60  GFRAA >60 >60 >60  PROT 7.5 6.9 7.0  ALBUMIN 4.2 4.0 4.0  AST _2 ALT _3 ALKPHOS 69 72 68   BILITOT 0.5 0.6 0.5    RADIOGRAPHIC STUDIES: I have personally reviewed the radiological images as listed and agreed with the findings in the report. No results found.  ASSESSMENT & PLAN:   Cancer of ascending colon ([place]) # Ascending colon cancer; stage III on adjuvant chemotherapy with FOLFOX every 2 weeks 12. Given the positive margin- at surgery question need for radiation.   # Proceed with FOLFOX chemotherapy cycle #3 today. Patient tolerating chemotherapy well. Labs are adequate.  # Cold sensitivity/oxaliplatin grade 1  # Iron deficiency anemia- hemoglobin 11/improving. #4 IV venofer this week.   # Follow-up with me in 2 weeks with chemotherapy/lABS.       Cammie Sickle, MD 04/27/2016 7:36 AM

## 2016-04-28 ENCOUNTER — Inpatient Hospital Stay: Payer: BLUE CROSS/BLUE SHIELD

## 2016-04-28 VITALS — BP 112/74 | HR 69 | Temp 97.0°F | Resp 18

## 2016-04-28 DIAGNOSIS — C182 Malignant neoplasm of ascending colon: Secondary | ICD-10-CM | POA: Diagnosis not present

## 2016-04-28 DIAGNOSIS — D500 Iron deficiency anemia secondary to blood loss (chronic): Secondary | ICD-10-CM

## 2016-04-28 MED ORDER — IRON SUCROSE 20 MG/ML IV SOLN
200.0000 mg | Freq: Once | INTRAVENOUS | Status: AC
  Administered 2016-04-28: 200 mg via INTRAVENOUS
  Filled 2016-04-28: qty 10

## 2016-04-28 MED ORDER — SODIUM CHLORIDE 0.9 % IJ SOLN
10.0000 mL | INTRAMUSCULAR | Status: DC | PRN
  Filled 2016-04-28: qty 10

## 2016-04-28 MED ORDER — SODIUM CHLORIDE 0.9% FLUSH
10.0000 mL | INTRAVENOUS | Status: DC | PRN
  Filled 2016-04-28: qty 10

## 2016-04-28 MED ORDER — SODIUM CHLORIDE 0.9 % IJ SOLN
3.0000 mL | Freq: Once | INTRAMUSCULAR | Status: DC | PRN
  Filled 2016-04-28: qty 10

## 2016-04-28 MED ORDER — HEPARIN SOD (PORK) LOCK FLUSH 100 UNIT/ML IV SOLN
500.0000 [IU] | Freq: Once | INTRAVENOUS | Status: AC | PRN
  Administered 2016-04-28: 500 [IU]

## 2016-04-28 MED ORDER — HEPARIN SOD (PORK) LOCK FLUSH 100 UNIT/ML IV SOLN: 250.0000 [IU] | Freq: Once | INTRAVENOUS | Status: DC | PRN

## 2016-04-28 MED ORDER — SODIUM CHLORIDE 0.9 % IV SOLN
Freq: Once | INTRAVENOUS | Status: AC
  Administered 2016-04-28: 14:00:00 via INTRAVENOUS
  Filled 2016-04-28: qty 1000

## 2016-04-28 MED ORDER — ALTEPLASE 2 MG IJ SOLR: 2.0000 mg | Freq: Once | INTRAMUSCULAR | Status: DC | PRN

## 2016-04-28 MED ORDER — HEPARIN SOD (PORK) LOCK FLUSH 100 UNIT/ML IV SOLN: 500.0000 [IU] | Freq: Once | INTRAVENOUS | Status: DC | PRN

## 2016-05-12 ENCOUNTER — Inpatient Hospital Stay: Payer: BLUE CROSS/BLUE SHIELD

## 2016-05-12 ENCOUNTER — Encounter: Payer: Self-pay | Admitting: Internal Medicine

## 2016-05-12 ENCOUNTER — Inpatient Hospital Stay (HOSPITAL_BASED_OUTPATIENT_CLINIC_OR_DEPARTMENT_OTHER): Payer: BLUE CROSS/BLUE SHIELD | Admitting: Internal Medicine

## 2016-05-12 ENCOUNTER — Inpatient Hospital Stay: Payer: BLUE CROSS/BLUE SHIELD | Attending: Internal Medicine

## 2016-05-12 VITALS — BP 144/90 | HR 82 | Temp 96.5°F | Resp 16 | Ht 66.0 in | Wt 194.6 lb

## 2016-05-12 DIAGNOSIS — C182 Malignant neoplasm of ascending colon: Secondary | ICD-10-CM | POA: Diagnosis not present

## 2016-05-12 DIAGNOSIS — Z800 Family history of malignant neoplasm of digestive organs: Secondary | ICD-10-CM

## 2016-05-12 DIAGNOSIS — K219 Gastro-esophageal reflux disease without esophagitis: Secondary | ICD-10-CM | POA: Diagnosis not present

## 2016-05-12 DIAGNOSIS — K649 Unspecified hemorrhoids: Secondary | ICD-10-CM | POA: Diagnosis not present

## 2016-05-12 DIAGNOSIS — Z5111 Encounter for antineoplastic chemotherapy: Secondary | ICD-10-CM | POA: Insufficient documentation

## 2016-05-12 DIAGNOSIS — Z79899 Other long term (current) drug therapy: Secondary | ICD-10-CM

## 2016-05-12 DIAGNOSIS — D509 Iron deficiency anemia, unspecified: Secondary | ICD-10-CM | POA: Insufficient documentation

## 2016-05-12 DIAGNOSIS — Z808 Family history of malignant neoplasm of other organs or systems: Secondary | ICD-10-CM

## 2016-05-12 DIAGNOSIS — N400 Enlarged prostate without lower urinary tract symptoms: Secondary | ICD-10-CM | POA: Diagnosis not present

## 2016-05-12 DIAGNOSIS — R197 Diarrhea, unspecified: Secondary | ICD-10-CM | POA: Insufficient documentation

## 2016-05-12 LAB — COMPREHENSIVE METABOLIC PANEL
ALT: 20 U/L (ref 17–63)
AST: 25 U/L (ref 15–41)
Albumin: 3.8 g/dL (ref 3.5–5.0)
Alkaline Phosphatase: 73 U/L (ref 38–126)
Anion gap: 6 (ref 5–15)
BUN: 11 mg/dL (ref 6–20)
CHLORIDE: 106 mmol/L (ref 101–111)
CO2: 25 mmol/L (ref 22–32)
Calcium: 8.7 mg/dL — ABNORMAL LOW (ref 8.9–10.3)
Creatinine, Ser: 0.78 mg/dL (ref 0.61–1.24)
Glucose, Bld: 137 mg/dL — ABNORMAL HIGH (ref 65–99)
POTASSIUM: 4.2 mmol/L (ref 3.5–5.1)
SODIUM: 137 mmol/L (ref 135–145)
Total Bilirubin: 0.3 mg/dL (ref 0.3–1.2)
Total Protein: 7.1 g/dL (ref 6.5–8.1)

## 2016-05-12 LAB — CBC WITH DIFFERENTIAL/PLATELET
BASOS ABS: 0.1 10*3/uL (ref 0–0.1)
Basophils Relative: 1 %
EOS ABS: 0.1 10*3/uL (ref 0–0.7)
EOS PCT: 1 %
HCT: 40.2 % (ref 40.0–52.0)
Hemoglobin: 13 g/dL (ref 13.0–18.0)
LYMPHS PCT: 23 %
Lymphs Abs: 1.4 10*3/uL (ref 1.0–3.6)
MCH: 24.2 pg — AB (ref 26.0–34.0)
MCHC: 32.4 g/dL (ref 32.0–36.0)
MCV: 74.6 fL — AB (ref 80.0–100.0)
MONO ABS: 0.8 10*3/uL (ref 0.2–1.0)
Monocytes Relative: 14 %
Neutro Abs: 3.6 10*3/uL (ref 1.4–6.5)
Neutrophils Relative %: 61 %
PLATELETS: 153 10*3/uL (ref 150–440)
RBC: 5.39 MIL/uL (ref 4.40–5.90)
RDW: 33.9 % — AB (ref 11.5–14.5)
WBC: 6 10*3/uL (ref 3.8–10.6)

## 2016-05-12 MED ORDER — FLUOROURACIL CHEMO INJECTION 5 GM/100ML
2400.0000 mg/m2 | INTRAVENOUS | Status: DC
  Administered 2016-05-12: 4950 mg via INTRAVENOUS
  Filled 2016-05-12: qty 99

## 2016-05-12 MED ORDER — PALONOSETRON HCL INJECTION 0.25 MG/5ML
0.2500 mg | Freq: Once | INTRAVENOUS | Status: AC
  Administered 2016-05-12: 0.25 mg via INTRAVENOUS
  Filled 2016-05-12: qty 5

## 2016-05-12 MED ORDER — FLUOROURACIL CHEMO INJECTION 2.5 GM/50ML
400.0000 mg/m2 | Freq: Once | INTRAVENOUS | Status: AC
  Administered 2016-05-12: 800 mg via INTRAVENOUS
  Filled 2016-05-12: qty 16

## 2016-05-12 MED ORDER — HEPARIN SOD (PORK) LOCK FLUSH 100 UNIT/ML IV SOLN
500.0000 [IU] | Freq: Once | INTRAVENOUS | Status: DC
  Filled 2016-05-12: qty 5

## 2016-05-12 MED ORDER — DEXTROSE 5 % IV SOLN
Freq: Once | INTRAVENOUS | Status: AC
  Administered 2016-05-12: 10:00:00 via INTRAVENOUS
  Filled 2016-05-12: qty 1000

## 2016-05-12 MED ORDER — SODIUM CHLORIDE 0.9 % IJ SOLN
10.0000 mL | Freq: Once | INTRAMUSCULAR | Status: AC
Start: 2016-05-12 — End: 2016-05-12
  Administered 2016-05-12: 10 mL via INTRAVENOUS
  Filled 2016-05-12: qty 10

## 2016-05-12 MED ORDER — DEXTROSE 5 % IV SOLN
800.0000 mg | Freq: Once | INTRAVENOUS | Status: AC
  Administered 2016-05-12: 800 mg via INTRAVENOUS
  Filled 2016-05-12: qty 40

## 2016-05-12 MED ORDER — SODIUM CHLORIDE 0.9 % IV SOLN
10.0000 mg | Freq: Once | INTRAVENOUS | Status: AC
  Administered 2016-05-12: 10 mg via INTRAVENOUS
  Filled 2016-05-12: qty 1

## 2016-05-12 MED ORDER — OXALIPLATIN CHEMO INJECTION 100 MG/20ML
85.0000 mg/m2 | Freq: Once | INTRAVENOUS | Status: AC
  Administered 2016-05-12: 175 mg via INTRAVENOUS
  Filled 2016-05-12: qty 35

## 2016-05-12 NOTE — Assessment & Plan Note (Signed)
#   Ascending colon cancer; stage III on adjuvant chemotherapy with FOLFOX every 2 weeks 12. Given the positive margin- at surgery question need for radiation.   # Proceed with FOLFOX chemotherapy cycle #4  today. Patient tolerating chemotherapy well. Labs are adequate.  # Cold sensitivity/oxaliplatin grade 1- not an worse.   # Iron deficiency anemia- hemoglobin 13/ s/p IV iron.   # Follow-up with me in 2 weeks with chemotherapy/labs.

## 2016-05-12 NOTE — Progress Notes (Signed)
No changes since last visit and no concerns

## 2016-05-12 NOTE — Progress Notes (Signed)
[place] NOTE  Patient Care Team: Marden Noble, MD as PCP - General (Internal Medicine) Marden Noble, MD (Internal Medicine) Christene Lye, MD (General Surgery)  CHIEF COMPLAINTS/PURPOSE OF CONSULTATION:  Oncology History   # June-July 2017-Ascending COLON CA STAGE III [pT3pN2 (4/14LN positive); POSITIVE RADIAL MARGIN; Dr.Sankar]; pre-op CEA- 1.5/N; MRI- liver-NEG; 03/26/2016- PET-NED   # June 24th 2017-  FOLFOX q 2 W  # IDA- [aug 2017] s/p IV venofer x4.    # MOLECULAR TESTING: MSI-STABLE; F-ONE-[july 2017] B- RAF V 600 E; Wild type Kras/N-ras ; others**     Cancer of ascending colon ([place])   02/26/2016 Initial Diagnosis    Cancer of ascending colon ([facility])        HISTORY OF PRESENTING ILLNESS:  Philip Shah 60 y.o.  male above history of stage III colon cancer is Currently on adjuvant FOLFOX.   No chest pain or shortness of breath or cough.No nausea no vomiting. No bleeding. No significant tingling and numbness. Energy level is improving since start exercise program  ROS: A complete 10 point review of system is done which is negative except mentioned above in history of present illness.  MEDICAL HISTORY:  Past Medical History:  Diagnosis Date  . Abnormal colonoscopy 02-2016  . Anemia   . Enlarged prostate   . GERD (gastroesophageal reflux disease)   . H/O vasectomy 68  . Hemorrhoids   . Hx of ligation of vein 1981  . Sciatica   . Status post colon resection 02-26-16    SURGICAL HISTORY: Past Surgical History:  Procedure Laterality Date  . APPENDECTOMY    . COLONOSCOPY WITH PROPOFOL N/A 02/11/2016   Procedure: COLONOSCOPY WITH PROPOFOL;  Surgeon: Christene Lye, MD;  Location: [facility];  Service: Endoscopy;  Laterality: N/A;  . ESOPHAGOGASTRODUODENOSCOPY (EGD) WITH PROPOFOL N/A 02/11/2016   Procedure: ESOPHAGOGASTRODUODENOSCOPY (EGD) WITH PROPOFOL;  Surgeon: Christene Lye, MD;  Location: [facility];   Service: Endoscopy;  Laterality: N/A;  . LAPAROSCOPIC RIGHT COLECTOMY Right 02/26/2016   Procedure: LAPAROSCOPIC RIGHT COLECTOMY;  Surgeon: Christene Lye, MD;  Location: [facility];  Service: General;  Laterality: Right;  . PORTACATH PLACEMENT N/A 03/18/2016   Procedure: INSERTION PORT-A-CATH;  Surgeon: Christene Lye, MD;  Location: [facility];  Service: General;  Laterality: N/A;  . VASECTOMY    . 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 Marital status: Single    Spouse name: N/A  . Number of children: N/A  . Years of education: N/A   Occupational History  . Not on file.   Social History Main Topics  . Smoking status: Never Smoker  . Smokeless tobacco: Never Used  . Alcohol use No     Comment: 3/day  . Drug use: No  . Sexual activity: Not on file   Other Topics Concern  . Not on file   Social History Narrative  . No narrative on file    FAMILY HISTORY: mat aunt- anal cancer; oldest brother/pre-cancerous polyps- [Dr. In [place]] Family History  Problem Relation Age of Onset  . Kidney failure Mother   . Cancer Father     spine    ALLERGIES:  is allergic to penicillins.  MEDICATIONS:  Current Outpatient Prescriptions  Medication Sig Dispense Refill  . ferrous sulfate (FERROUSUL) 325 (65 FE) MG  tablet Take 1 tablet (325 mg total) by mouth daily with breakfast. 30 tablet 3  . lidocaine-prilocaine (EMLA) cream Apply 1 application topically as needed. Apply generously over the Mediport 45 minutes prior to chemotherapy. 30 g 0  . Multiple Vitamin (MULTIVITAMIN WITH MINERALS) TABS tablet Take 1 tablet by mouth daily.    . ondansetron (ZOFRAN) 8 MG tablet Take 1 tablet (8 mg total) by mouth every 8 (eight) hours as needed for nausea or vomiting (start 3 days; after chemo). 40 tablet 1  . prochlorperazine (COMPAZINE) 10 MG  tablet Take 1 tablet (10 mg total) by mouth every 6 (six) hours as needed for nausea or vomiting. 40 tablet 1  . traMADol (ULTRAM) 50 MG tablet Take 1 tablet (50 mg total) by mouth every 6 (six) hours as needed. 30 tablet 0   No current facility-administered medications for this visit.       Marland Kitchen  PHYSICAL EXAMINATION: ECOG PERFORMANCE STATUS: 0 - Asymptomatic  Vitals:   05/12/16 0922  BP: (!) 144/90  Pulse: 82  Resp: 16  Temp: (!) 96.5 F (35.8 C)   Filed Weights   05/12/16 0922  Weight: 194 lb 9.6 oz (88.3 kg)    GENERAL: Well-nourished well-developed; Alert, no distress and comfortable. He is alone. EYES: no pallor or icterus OROPHARYNX: no thrush or ulceration; good dentition  NECK: supple, no masses felt LYMPH:  no palpable lymphadenopathy in the cervical, axillary or inguinal regions LUNGS: clear to auscultation and  No wheeze or crackles HEART/CVS: regular rate & rhythm and no murmurs; No lower extremity edema ABDOMEN: abdomen soft, non-tender and normal bowel sounds.  Musculoskeletal:no cyanosis of digits and no clubbing  PSYCH: alert & oriented x 3 with fluent speech NEURO: no focal motor/sensory deficits SKIN:  no rashes or significant lesions  LABORATORY DATA:  I have reviewed the data as listed Lab Results  Component Value Date   WBC 6.0 05/12/2016   HGB 13.0 05/12/2016   HCT 40.2 05/12/2016   MCV 74.6 (L) 05/12/2016   PLT 153 05/12/2016    Recent Labs  04/12/16 0830 04/26/16 0809 05/12/16 0830  NA 136 136 137  K 4.2 3.9 4.2  CL 106 105 106  CO2 '27 24 25  ' GLUCOSE 119* 162* 137*  BUN '9 9 11  ' CREATININE 0.79 0.71 0.78  CALCIUM 8.7* 8.8* 8.7*  GFRNONAA >60 >60 >60  GFRAA >60 >60 >60  PROT 6.9 7.0 7.1  ALBUMIN 4.0 4.0 3.8  AST '25 27 25  ' ALT '22 23 20  ' ALKPHOS 72 68 73  BILITOT 0.6 0.5 0.3    RADIOGRAPHIC STUDIES: I have personally reviewed the radiological images as listed and agreed with the findings in the report. No results  found.  ASSESSMENT & PLAN:   Cancer of ascending colon ([place]) # Ascending colon cancer; stage III on adjuvant chemotherapy with FOLFOX every 2 weeks 12. Given the positive margin- at surgery question need for radiation.   # Proceed with FOLFOX chemotherapy cycle #4  today. Patient tolerating chemotherapy well. Labs are adequate.  # Cold sensitivity/oxaliplatin grade 1- not an worse.   # Iron deficiency anemia- hemoglobin 13/ s/p IV iron.   # Follow-up with me in 2 weeks with chemotherapy/labs.       Cammie Sickle, MD 05/12/2016 6:00 PM

## 2016-05-14 ENCOUNTER — Inpatient Hospital Stay: Payer: BLUE CROSS/BLUE SHIELD

## 2016-05-14 VITALS — BP 116/77 | HR 96 | Temp 98.3°F | Resp 20

## 2016-05-14 DIAGNOSIS — C182 Malignant neoplasm of ascending colon: Secondary | ICD-10-CM | POA: Diagnosis not present

## 2016-05-14 MED ORDER — HEPARIN SOD (PORK) LOCK FLUSH 100 UNIT/ML IV SOLN
500.0000 [IU] | Freq: Once | INTRAVENOUS | Status: AC | PRN
  Administered 2016-05-14: 500 [IU]
  Filled 2016-05-14: qty 5

## 2016-05-14 MED ORDER — SODIUM CHLORIDE 0.9% FLUSH
10.0000 mL | INTRAVENOUS | Status: DC | PRN
  Administered 2016-05-14: 10 mL
  Filled 2016-05-14: qty 10

## 2016-05-26 ENCOUNTER — Inpatient Hospital Stay: Payer: BLUE CROSS/BLUE SHIELD

## 2016-05-26 ENCOUNTER — Encounter: Payer: Self-pay | Admitting: Internal Medicine

## 2016-05-26 ENCOUNTER — Inpatient Hospital Stay (HOSPITAL_BASED_OUTPATIENT_CLINIC_OR_DEPARTMENT_OTHER): Payer: BLUE CROSS/BLUE SHIELD | Admitting: Internal Medicine

## 2016-05-26 VITALS — BP 132/84 | HR 84 | Temp 96.9°F | Resp 17 | Ht 66.0 in | Wt 192.0 lb

## 2016-05-26 DIAGNOSIS — N400 Enlarged prostate without lower urinary tract symptoms: Secondary | ICD-10-CM

## 2016-05-26 DIAGNOSIS — R197 Diarrhea, unspecified: Secondary | ICD-10-CM | POA: Diagnosis not present

## 2016-05-26 DIAGNOSIS — Z808 Family history of malignant neoplasm of other organs or systems: Secondary | ICD-10-CM

## 2016-05-26 DIAGNOSIS — D509 Iron deficiency anemia, unspecified: Secondary | ICD-10-CM

## 2016-05-26 DIAGNOSIS — C182 Malignant neoplasm of ascending colon: Secondary | ICD-10-CM

## 2016-05-26 DIAGNOSIS — Z800 Family history of malignant neoplasm of digestive organs: Secondary | ICD-10-CM

## 2016-05-26 DIAGNOSIS — K649 Unspecified hemorrhoids: Secondary | ICD-10-CM

## 2016-05-26 DIAGNOSIS — Z79899 Other long term (current) drug therapy: Secondary | ICD-10-CM

## 2016-05-26 DIAGNOSIS — K219 Gastro-esophageal reflux disease without esophagitis: Secondary | ICD-10-CM

## 2016-05-26 LAB — COMPREHENSIVE METABOLIC PANEL
ALBUMIN: 3.6 g/dL (ref 3.5–5.0)
ALT: 22 U/L (ref 17–63)
AST: 32 U/L (ref 15–41)
Alkaline Phosphatase: 75 U/L (ref 38–126)
Anion gap: 5 (ref 5–15)
BUN: 10 mg/dL (ref 6–20)
CHLORIDE: 105 mmol/L (ref 101–111)
CO2: 24 mmol/L (ref 22–32)
CREATININE: 0.75 mg/dL (ref 0.61–1.24)
Calcium: 8.4 mg/dL — ABNORMAL LOW (ref 8.9–10.3)
GFR calc Af Amer: >60 mL/min (ref >60)
GFR calc non Af Amer: >60 mL/min (ref >60)
GLUCOSE: 138 mg/dL — AB (ref 65–99)
Potassium: 3.8 mmol/L (ref 3.5–5.1)
SODIUM: 134 mmol/L — AB (ref 135–145)
Total Bilirubin: 0.8 mg/dL (ref 0.3–1.2)
Total Protein: 6.8 g/dL (ref 6.5–8.1)

## 2016-05-26 LAB — CBC WITH DIFFERENTIAL/PLATELET
BASOS PCT: 1 %
Basophils Absolute: 0.1 10*3/uL (ref 0–0.1)
EOS ABS: 0.1 10*3/uL (ref 0–0.7)
Eosinophils Relative: 1 %
HCT: 40 % (ref 40.0–52.0)
Hemoglobin: 13.3 g/dL (ref 13.0–18.0)
Lymphocytes Relative: 24 %
Lymphs Abs: 1.4 10*3/uL (ref 1.0–3.6)
MCH: 25.6 pg — AB (ref 26.0–34.0)
MCHC: 33.1 g/dL (ref 32.0–36.0)
MCV: 77.3 fL — ABNORMAL LOW (ref 80.0–100.0)
MONO ABS: 0.6 10*3/uL (ref 0.2–1.0)
MONOS PCT: 10 %
Neutro Abs: 3.7 10*3/uL (ref 1.4–6.5)
Neutrophils Relative %: 64 %
PLATELETS: 106 10*3/uL — AB (ref 150–440)
RBC: 5.18 MIL/uL (ref 4.40–5.90)
RDW: 33.4 % — ABNORMAL HIGH (ref 11.5–14.5)
WBC: 5.8 10*3/uL (ref 3.8–10.6)

## 2016-05-26 MED ORDER — SODIUM CHLORIDE 0.9 % IV SOLN
10.0000 mg | Freq: Once | INTRAVENOUS | Status: AC
  Administered 2016-05-26: 10 mg via INTRAVENOUS
  Filled 2016-05-26: qty 1

## 2016-05-26 MED ORDER — SODIUM CHLORIDE 0.9 % IV SOLN
2400.0000 mg/m2 | INTRAVENOUS | Status: DC
  Administered 2016-05-26: 4950 mg via INTRAVENOUS
  Filled 2016-05-26: qty 99

## 2016-05-26 MED ORDER — PALONOSETRON HCL INJECTION 0.25 MG/5ML
0.2500 mg | Freq: Once | INTRAVENOUS | Status: AC
  Administered 2016-05-26: 0.25 mg via INTRAVENOUS
  Filled 2016-05-26: qty 5

## 2016-05-26 MED ORDER — DEXTROSE 5 % IV SOLN
Freq: Once | INTRAVENOUS | Status: AC
  Administered 2016-05-26: 11:00:00 via INTRAVENOUS
  Filled 2016-05-26: qty 1000

## 2016-05-26 MED ORDER — LEUCOVORIN CALCIUM INJECTION 350 MG
800.0000 mg | Freq: Once | INTRAVENOUS | Status: AC
  Administered 2016-05-26: 800 mg via INTRAVENOUS
  Filled 2016-05-26: qty 40

## 2016-05-26 MED ORDER — SODIUM CHLORIDE 0.9% FLUSH
10.0000 mL | INTRAVENOUS | Status: DC | PRN
  Administered 2016-05-26: 10 mL
  Filled 2016-05-26: qty 10

## 2016-05-26 MED ORDER — OXALIPLATIN CHEMO INJECTION 100 MG/20ML
85.0000 mg/m2 | Freq: Once | INTRAVENOUS | Status: AC
  Administered 2016-05-26: 175 mg via INTRAVENOUS
  Filled 2016-05-26: qty 35

## 2016-05-26 MED ORDER — FLUOROURACIL CHEMO INJECTION 2.5 GM/50ML
400.0000 mg/m2 | Freq: Once | INTRAVENOUS | Status: AC
  Administered 2016-05-26: 800 mg via INTRAVENOUS
  Filled 2016-05-26: qty 16

## 2016-05-26 NOTE — Assessment & Plan Note (Signed)
#   Ascending colon cancer; stage III on adjuvant chemotherapy with FOLFOX every 2 weeks 12. Given the positive margin- at surgery question need for radiation.   # Proceed with FOLFOX chemotherapy cycle #5  today. Patient tolerating chemotherapy well. Labs are adequate except platelets- 105.   # diarrhea- G-1. On imodium.   # Cold sensitivity/oxaliplatin grade 1- not an worse.   # Iron deficiency anemia- hemoglobin 13; PO iron q day.   # exercise class- fatigue improved.   # Follow-up with me in 2 weeks with chemotherapy/labs.

## 2016-05-26 NOTE — Progress Notes (Signed)
[facility] NOTE  Patient Care Team: Marden Noble, MD as PCP - General (Internal Medicine) Marden Noble, MD (Internal Medicine) Christene Lye, MD (General Surgery)  CHIEF COMPLAINTS/PURPOSE OF CONSULTATION:  Oncology History   # June-July 2017-Ascending COLON CA STAGE III [pT3pN2 (4/14LN positive); POSITIVE RADIAL MARGIN; Dr.Sankar]; pre-op CEA- 1.5/N; MRI- liver-NEG; 03/26/2016- PET-NED   # June 24th 2017-  FOLFOX q 2 W  # IDA- [aug 2017] s/p IV venofer x4.    # MOLECULAR TESTING: MSI-STABLE; F-ONE-[july 2017] B- RAF V 600 E; Wild type Kras/N-ras ; others**     Cancer of ascending colon ([place])   02/26/2016 Initial Diagnosis    Cancer of ascending colon ([facility]CC)        HISTORY OF PRESENTING ILLNESS:  Philip Shah 60 y.o.  male above history of stage III colon cancer is Currently on adjuvant FOLFOX.   Admits to cold sensitivity otherwise no chronic tingling and numbness.  No chest pain or shortness of breath or cough.No nausea no vomiting. No bleeding. . Energy level is improving since start exercise program  ROS: A complete 10 point review of system is done which is negative except mentioned above in history of present illness.  MEDICAL HISTORY:  Past Medical History:  Diagnosis Date  . Abnormal colonoscopy 02-2016  . Anemia   . Enlarged prostate   . GERD (gastroesophageal reflux disease)   . H/O vasectomy 24  . Hemorrhoids   . Hx of ligation of vein 1981  . Sciatica   . Status post colon resection 02-26-16    SURGICAL HISTORY: Past Surgical History:  Procedure Laterality Date  . APPENDECTOMY    . COLONOSCOPY WITH PROPOFOL N/A 02/11/2016   Procedure: COLONOSCOPY WITH PROPOFOL;  Surgeon: Christene Lye, MD;  Location: [facility];  Service: Endoscopy;  Laterality: N/A;  . ESOPHAGOGASTRODUODENOSCOPY (EGD) WITH PROPOFOL N/A 02/11/2016   Procedure: ESOPHAGOGASTRODUODENOSCOPY (EGD) WITH PROPOFOL;  Surgeon: Christene Lye, MD;  Location: [facility];  Service: Endoscopy;  Laterality: N/A;  . LAPAROSCOPIC RIGHT COLECTOMY Right 02/26/2016   Procedure: LAPAROSCOPIC RIGHT COLECTOMY;  Surgeon: Christene Lye, MD;  Location: [facility];  Service: General;  Laterality: Right;  . PORTACATH PLACEMENT N/A 03/18/2016   Procedure: INSERTION PORT-A-CATH;  Surgeon: Christene Lye, MD;  Location: [facility] ORS;  Service: General;  Laterality: N/A;  . VASECTOMY    . 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 Marital status: Single    Spouse name: N/A  . Number of children: N/A  . Years of education: N/A   Occupational History  . Not on file.   Social History Main Topics  . Smoking status: Never Smoker  . Smokeless tobacco: Never Used  . Alcohol use No     Comment: 3/day  . Drug use: No  . Sexual activity: Not on file   Other Topics Concern  . Not on file   Social History Narrative  . No narrative on file    FAMILY HISTORY: mat aunt- anal cancer; oldest brother/pre-cancerous polyps- [Dr. In [place]] Family History  Problem Relation Age of Onset  . Kidney failure Mother   . Cancer Father     spine    ALLERGIES:  is allergic to penicillins.  MEDICATIONS:  Current Outpatient Prescriptions  Medication Sig Dispense Refill  .  ferrous sulfate (FERROUSUL) 325 (65 FE) MG tablet Take 1 tablet (325 mg total) by mouth daily with breakfast. 30 tablet 3  . lidocaine-prilocaine (EMLA) cream Apply 1 application topically as needed. Apply generously over the Mediport 45 minutes prior to chemotherapy. 30 g 0  . Multiple Vitamin (MULTIVITAMIN WITH MINERALS) TABS tablet Take 1 tablet by mouth daily.    . ondansetron (ZOFRAN) 8 MG tablet Take 1 tablet (8 mg total) by mouth every 8 (eight) hours as needed for nausea or vomiting (start 3 days; after chemo). (Patient  not taking: Reported on 05/26/2016) 40 tablet 1  . prochlorperazine (COMPAZINE) 10 MG tablet Take 1 tablet (10 mg total) by mouth every 6 (six) hours as needed for nausea or vomiting. (Patient not taking: Reported on 05/26/2016) 40 tablet 1  . traMADol (ULTRAM) 50 MG tablet Take 1 tablet (50 mg total) by mouth every 6 (six) hours as needed. (Patient not taking: Reported on 05/26/2016) 30 tablet 0   No current facility-administered medications for this visit.    Facility-Administered Medications Ordered in Other Visits  Medication Dose Route Frequency Provider Last Rate Last Dose  . dextrose 5 % solution   Intravenous Once Cammie Sickle, MD   Stopped at 05/26/16 1310  . fluorouracil (ADRUCIL) 4,950 mg in sodium chloride 0.9 % 51 mL chemo infusion  2,400 mg/m2 (Treatment Plan Recorded) Intravenous 1 day or 1 dose Cammie Sickle, MD   4,950 mg at 05/26/16 1304  . sodium chloride flush (NS) 0.9 % injection 10 mL  10 mL Intracatheter PRN Cammie Sickle, MD   10 mL at 05/26/16 1039      .  PHYSICAL EXAMINATION: ECOG PERFORMANCE STATUS: 0 - Asymptomatic  Vitals:   05/26/16 0906  BP: 132/84  Pulse: 84  Resp: 17  Temp: (!) 96.9 F (36.1 C)   Filed Weights   05/26/16 0906  Weight: 192 lb (87.1 kg)    GENERAL: Well-nourished well-developed; Alert, no distress and comfortable. He is alone. EYES: no pallor or icterus OROPHARYNX: no thrush or ulceration; good dentition  NECK: supple, no masses felt LYMPH:  no palpable lymphadenopathy in the cervical, axillary or inguinal regions LUNGS: clear to auscultation and  No wheeze or crackles HEART/CVS: regular rate & rhythm and no murmurs; No lower extremity edema ABDOMEN: abdomen soft, non-tender and normal bowel sounds.  Musculoskeletal:no cyanosis of digits and no clubbing  PSYCH: alert & oriented x 3 with fluent speech NEURO: no focal motor/sensory deficits SKIN:  no rashes or significant lesions  LABORATORY DATA:  I have  reviewed the data as listed Lab Results  Component Value Date   WBC 5.8 05/26/2016   HGB 13.3 05/26/2016   HCT 40.0 05/26/2016   MCV 77.3 (L) 05/26/2016   PLT 106 (L) 05/26/2016    Recent Labs  04/26/16 0809 05/12/16 0830 05/26/16 0855  NA 136 137 134*  K 3.9 4.2 3.8  CL 105 106 105  CO2 _0 GLUCOSE 162* 137* 138*  BUN _1 CREATININE 0.71 0.78 0.75  CALCIUM 8.8* 8.7* 8.4*  GFRNONAA >60 >60 >60  GFRAA >60 >60 >60  PROT 7.0 7.1 6.8  ALBUMIN 4.0 3.8 3.6  AST 27 25 32  ALT _2 ALKPHOS 68 73 75  BILITOT 0.5 0.3 0.8    RADIOGRAPHIC STUDIES: I have personally reviewed the radiological images as listed and agreed with the findings in the report. No results found.  ASSESSMENT &  PLAN:   Cancer of ascending colon ([place]) # Ascending colon cancer; stage III on adjuvant chemotherapy with FOLFOX every 2 weeks 12. Given the positive margin- at surgery question need for radiation.   # Proceed with FOLFOX chemotherapy cycle #5  today. Patient tolerating chemotherapy well. Labs are adequate except platelets- 105.   # diarrhea- G-1. On imodium.   # Cold sensitivity/oxaliplatin grade 1- not an worse.   # Iron deficiency anemia- hemoglobin 13; PO iron q day.   # exercise class- fatigue improved.   # Follow-up with me in 2 weeks with chemotherapy/labs.       Cammie Sickle, MD 05/26/2016 1:09 PM

## 2016-05-26 NOTE — Progress Notes (Signed)
Pt would like to know if he may have a flu shot today.  No other concerns.

## 2016-05-28 ENCOUNTER — Inpatient Hospital Stay: Payer: BLUE CROSS/BLUE SHIELD

## 2016-05-28 DIAGNOSIS — C182 Malignant neoplasm of ascending colon: Secondary | ICD-10-CM

## 2016-05-28 MED ORDER — HEPARIN SOD (PORK) LOCK FLUSH 100 UNIT/ML IV SOLN
INTRAVENOUS | Status: AC
  Filled 2016-05-28: qty 5

## 2016-05-28 MED ORDER — HEPARIN SOD (PORK) LOCK FLUSH 100 UNIT/ML IV SOLN
500.0000 [IU] | Freq: Once | INTRAVENOUS | Status: AC | PRN
  Administered 2016-05-28: 500 [IU]

## 2016-05-28 MED ORDER — SODIUM CHLORIDE 0.9% FLUSH
10.0000 mL | INTRAVENOUS | Status: DC | PRN
  Administered 2016-05-28: 10 mL
  Filled 2016-05-28: qty 10

## 2016-06-09 ENCOUNTER — Encounter: Payer: Self-pay | Admitting: Internal Medicine

## 2016-06-09 ENCOUNTER — Inpatient Hospital Stay: Payer: BLUE CROSS/BLUE SHIELD | Attending: Internal Medicine

## 2016-06-09 ENCOUNTER — Inpatient Hospital Stay (HOSPITAL_BASED_OUTPATIENT_CLINIC_OR_DEPARTMENT_OTHER): Payer: BLUE CROSS/BLUE SHIELD | Admitting: Internal Medicine

## 2016-06-09 ENCOUNTER — Inpatient Hospital Stay: Payer: BLUE CROSS/BLUE SHIELD

## 2016-06-09 VITALS — BP 144/91 | HR 84 | Temp 97.3°F | Resp 18 | Ht 66.0 in | Wt 190.0 lb

## 2016-06-09 DIAGNOSIS — Z808 Family history of malignant neoplasm of other organs or systems: Secondary | ICD-10-CM

## 2016-06-09 DIAGNOSIS — D509 Iron deficiency anemia, unspecified: Secondary | ICD-10-CM | POA: Diagnosis not present

## 2016-06-09 DIAGNOSIS — Z5111 Encounter for antineoplastic chemotherapy: Secondary | ICD-10-CM | POA: Insufficient documentation

## 2016-06-09 DIAGNOSIS — Z9049 Acquired absence of other specified parts of digestive tract: Secondary | ICD-10-CM | POA: Diagnosis not present

## 2016-06-09 DIAGNOSIS — C182 Malignant neoplasm of ascending colon: Secondary | ICD-10-CM

## 2016-06-09 DIAGNOSIS — Z79899 Other long term (current) drug therapy: Secondary | ICD-10-CM

## 2016-06-09 DIAGNOSIS — Z23 Encounter for immunization: Secondary | ICD-10-CM

## 2016-06-09 DIAGNOSIS — N400 Enlarged prostate without lower urinary tract symptoms: Secondary | ICD-10-CM | POA: Insufficient documentation

## 2016-06-09 DIAGNOSIS — G629 Polyneuropathy, unspecified: Secondary | ICD-10-CM | POA: Insufficient documentation

## 2016-06-09 DIAGNOSIS — K219 Gastro-esophageal reflux disease without esophagitis: Secondary | ICD-10-CM

## 2016-06-09 DIAGNOSIS — K649 Unspecified hemorrhoids: Secondary | ICD-10-CM | POA: Diagnosis not present

## 2016-06-09 DIAGNOSIS — R197 Diarrhea, unspecified: Secondary | ICD-10-CM

## 2016-06-09 DIAGNOSIS — M5430 Sciatica, unspecified side: Secondary | ICD-10-CM | POA: Diagnosis not present

## 2016-06-09 LAB — CBC WITH DIFFERENTIAL/PLATELET
Basophils Absolute: 0.1 10*3/uL (ref 0–0.1)
Basophils Relative: 1 %
Eosinophils Absolute: 0.1 10*3/uL (ref 0–0.7)
Eosinophils Relative: 1 %
HCT: 40.3 % (ref 40.0–52.0)
HEMOGLOBIN: 13.4 g/dL (ref 13.0–18.0)
LYMPHS ABS: 1.2 10*3/uL (ref 1.0–3.6)
LYMPHS PCT: 26 %
MCH: 26.5 pg (ref 26.0–34.0)
MCHC: 33.3 g/dL (ref 32.0–36.0)
MCV: 79.4 fL — AB (ref 80.0–100.0)
Monocytes Absolute: 0.5 10*3/uL (ref 0.2–1.0)
Monocytes Relative: 12 %
NEUTROS ABS: 2.7 10*3/uL (ref 1.4–6.5)
NEUTROS PCT: 60 %
PLATELETS: 106 10*3/uL — AB (ref 150–440)
RBC: 5.08 MIL/uL (ref 4.40–5.90)
RDW: 32.4 % — AB (ref 11.5–14.5)
WBC: 4.5 10*3/uL (ref 3.8–10.6)

## 2016-06-09 LAB — COMPREHENSIVE METABOLIC PANEL
ALBUMIN: 3.7 g/dL (ref 3.5–5.0)
ALK PHOS: 84 U/L (ref 38–126)
ALT: 24 U/L (ref 17–63)
AST: 34 U/L (ref 15–41)
Anion gap: 6 (ref 5–15)
BUN: 10 mg/dL (ref 6–20)
CALCIUM: 8.8 mg/dL — AB (ref 8.9–10.3)
CO2: 24 mmol/L (ref 22–32)
CREATININE: 0.77 mg/dL (ref 0.61–1.24)
Chloride: 106 mmol/L (ref 101–111)
GFR calc non Af Amer: >60 mL/min (ref >60)
GLUCOSE: 172 mg/dL — AB (ref 65–99)
Potassium: 3.8 mmol/L (ref 3.5–5.1)
SODIUM: 136 mmol/L (ref 135–145)
Total Bilirubin: 0.6 mg/dL (ref 0.3–1.2)
Total Protein: 7.1 g/dL (ref 6.5–8.1)

## 2016-06-09 MED ORDER — HEPARIN SOD (PORK) LOCK FLUSH 100 UNIT/ML IV SOLN
500.0000 [IU] | Freq: Once | INTRAVENOUS | Status: DC
  Filled 2016-06-09: qty 5

## 2016-06-09 MED ORDER — INFLUENZA VAC SPLIT QUAD 0.5 ML IM SUSY: 0.5000 mL | PREFILLED_SYRINGE | Freq: Once | INTRAMUSCULAR | Status: DC

## 2016-06-09 MED ORDER — SODIUM CHLORIDE 0.9 % IJ SOLN
10.0000 mL | Freq: Once | INTRAMUSCULAR | Status: AC
  Administered 2016-06-09: 10 mL via INTRAVENOUS
  Filled 2016-06-09: qty 10

## 2016-06-09 MED ORDER — DEXTROSE 5 % IV SOLN
Freq: Once | INTRAVENOUS | Status: AC
  Administered 2016-06-09: 11:00:00 via INTRAVENOUS
  Filled 2016-06-09: qty 1000

## 2016-06-09 MED ORDER — PALONOSETRON HCL INJECTION 0.25 MG/5ML
0.2500 mg | Freq: Once | INTRAVENOUS | Status: AC
  Administered 2016-06-09: 0.25 mg via INTRAVENOUS
  Filled 2016-06-09: qty 5

## 2016-06-09 MED ORDER — DEXTROSE 5 % IV SOLN
800.0000 mg | Freq: Once | INTRAVENOUS | Status: AC
  Administered 2016-06-09: 800 mg via INTRAVENOUS
  Filled 2016-06-09: qty 25

## 2016-06-09 MED ORDER — OXALIPLATIN CHEMO INJECTION 100 MG/20ML
85.0000 mg/m2 | Freq: Once | INTRAVENOUS | Status: AC
  Administered 2016-06-09: 175 mg via INTRAVENOUS
  Filled 2016-06-09: qty 35

## 2016-06-09 MED ORDER — FLUOROURACIL CHEMO INJECTION 2.5 GM/50ML
400.0000 mg/m2 | Freq: Once | INTRAVENOUS | Status: AC
  Administered 2016-06-09: 800 mg via INTRAVENOUS
  Filled 2016-06-09: qty 16

## 2016-06-09 MED ORDER — SODIUM CHLORIDE 0.9 % IV SOLN
10.0000 mg | Freq: Once | INTRAVENOUS | Status: AC
  Administered 2016-06-09: 10 mg via INTRAVENOUS
  Filled 2016-06-09: qty 1

## 2016-06-09 MED ORDER — SODIUM CHLORIDE 0.9 % IV SOLN
2400.0000 mg/m2 | INTRAVENOUS | Status: DC
  Administered 2016-06-09: 4950 mg via INTRAVENOUS
  Filled 2016-06-09: qty 99

## 2016-06-09 NOTE — Progress Notes (Signed)
[facility] NOTE  Patient Care Team: Marden Noble, MD as PCP - General (Internal Medicine) Marden Noble, MD (Internal Medicine) Christene Lye, MD (General Surgery)  CHIEF COMPLAINTS/PURPOSE OF CONSULTATION:  Oncology History   # June-July 2017-Ascending COLON CA STAGE III [pT3pN2 (4/14LN positive); POSITIVE RADIAL MARGIN; Dr.Sankar]; pre-op CEA- 1.5/N; MRI- liver-NEG; 03/26/2016- PET-NED   # June 24th 2017-  FOLFOX q 2 W  # IDA- [aug 2017] s/p IV venofer x4.    # MOLECULAR TESTING: MSI-STABLE; F-ONE-[july 2017] B- RAF V 600 E; Wild type Kras/N-ras ; others**     Cancer of ascending colon ([place])   02/26/2016 Initial Diagnosis    Cancer of ascending colon (HCC)        HISTORY OF PRESENTING ILLNESS:  Philip Shah 60 y.o.  male above history of stage III colon cancer is Currently on adjuvant FOLFOX.   Denies any nausea vomiting. Denies any bleeding. Energy levels are adequate. Denies any significant tingling and numbness except for cold sensitivity.  No sores in the mouth ; weight adequate. No blood in stools black stools. Patient's skin rash on the face and exposed to sun. Currently resolved. Mild intermittent diarrhea.  ROS: A complete 10 point review of system is done which is negative except mentioned above in history of present illness.  MEDICAL HISTORY:  Past Medical History:  Diagnosis Date  . Abnormal colonoscopy 02-2016  . Anemia   . Enlarged prostate   . GERD (gastroesophageal reflux disease)   . H/O vasectomy 57  . Hemorrhoids   . Hx of ligation of vein 1981  . Sciatica   . Status post colon resection 02-26-16    SURGICAL HISTORY: Past Surgical History:  Procedure Laterality Date  . APPENDECTOMY    . COLONOSCOPY WITH PROPOFOL N/A 02/11/2016   Procedure: COLONOSCOPY WITH PROPOFOL;  Surgeon: Christene Lye, MD;  Location: [facility];  Service: Endoscopy;  Laterality: N/A;  . ESOPHAGOGASTRODUODENOSCOPY (EGD) WITH  PROPOFOL N/A 02/11/2016   Procedure: ESOPHAGOGASTRODUODENOSCOPY (EGD) WITH PROPOFOL;  Surgeon: Christene Lye, MD;  Location: [facility];  Service: Endoscopy;  Laterality: N/A;  . LAPAROSCOPIC RIGHT COLECTOMY Right 02/26/2016   Procedure: LAPAROSCOPIC RIGHT COLECTOMY;  Surgeon: Christene Lye, MD;  Location: [facility];  Service: General;  Laterality: Right;  . PORTACATH PLACEMENT N/A 03/18/2016   Procedure: INSERTION PORT-A-CATH;  Surgeon: Christene Lye, MD;  Location: [facility];  Service: General;  Laterality: N/A;  . VASECTOMY    . 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 Marital status: Single    Spouse name: N/A  . Number of children: N/A  . Years of education: N/A   Occupational History  . Not on file.   Social History Main Topics  . Smoking status: Never Smoker  . Smokeless tobacco: Never Used  . Alcohol use No     Comment: 3/day  . Drug use: No  . Sexual activity: Not on file   Other Topics Concern  . Not on file   Social History Narrative  . No narrative on file    FAMILY HISTORY: mat aunt- anal cancer; oldest brother/pre-cancerous polyps- [Dr. In [place]] Family History  Problem Relation Age of Onset  . Kidney failure Mother   . Cancer Father     spine    ALLERGIES:  is allergic to penicillins.  MEDICATIONS:  Current Outpatient Prescriptions  Medication Sig Dispense Refill  . ferrous sulfate (FERROUSUL) 325 (65 FE) MG tablet Take 1 tablet (325 mg total) by mouth daily with breakfast. 30 tablet 3  . lidocaine-prilocaine (EMLA) cream Apply 1 application topically as needed. Apply generously over the Mediport 45 minutes prior to chemotherapy. 30 g 0  . Multiple Vitamin (MULTIVITAMIN WITH MINERALS) TABS tablet Take 1 tablet by mouth daily.    . ondansetron (ZOFRAN) 8 MG tablet Take 1 tablet (8 mg  total) by mouth every 8 (eight) hours as needed for nausea or vomiting (start 3 days; after chemo). (Patient not taking: Reported on 06/09/2016) 40 tablet 1  . prochlorperazine (COMPAZINE) 10 MG tablet Take 1 tablet (10 mg total) by mouth every 6 (six) hours as needed for nausea or vomiting. (Patient not taking: Reported on 06/09/2016) 40 tablet 1  . traMADol (ULTRAM) 50 MG tablet Take 1 tablet (50 mg total) by mouth every 6 (six) hours as needed. (Patient not taking: Reported on 06/09/2016) 30 tablet 0   Current Facility-Administered Medications  Medication Dose Route Frequency Provider Last Rate Last Dose  . Influenza vac split quadrivalent PF (FLUARIX) injection 0.5 mL  0.5 mL Intramuscular Once Cammie Sickle, MD       Facility-Administered Medications Ordered in Other Visits  Medication Dose Route Frequency Provider Last Rate Last Dose  . fluorouracil (ADRUCIL) 4,950 mg in sodium chloride 0.9 % 51 mL chemo infusion  2,400 mg/m2 (Treatment Plan Recorded) Intravenous 1 day or 1 dose Cammie Sickle, MD   4,950 mg at 06/09/16 1411  . heparin lock flush 100 unit/mL  500 Units Intravenous Once Cammie Sickle, MD          .  PHYSICAL EXAMINATION: ECOG PERFORMANCE STATUS: 0 - Asymptomatic  Vitals:   06/09/16 1030  BP: (!) 144/91  Pulse: 84  Resp: 18  Temp: 97.3 F (36.3 C)   Filed Weights   06/09/16 1030  Weight: 190 lb (86.2 kg)    GENERAL: Well-nourished well-developed; Alert, no distress and comfortable. He is alone. EYES: no pallor or icterus OROPHARYNX: no thrush or ulceration; good dentition  NECK: supple, no masses felt LYMPH:  no palpable lymphadenopathy in the cervical, axillary or inguinal regions LUNGS: clear to auscultation and  No wheeze or crackles HEART/CVS: regular rate & rhythm and no murmurs; No lower extremity edema ABDOMEN: abdomen soft, non-tender and normal bowel sounds.  Musculoskeletal:no cyanosis of digits and no clubbing  PSYCH: alert &  oriented x 3 with fluent speech NEURO: no focal motor/sensory deficits SKIN:  no rashes or significant lesions  LABORATORY DATA:  I have reviewed the data as listed Lab Results  Component Value Date   WBC 4.5 06/09/2016   HGB 13.4 06/09/2016   HCT 40.3 06/09/2016   MCV 79.4 (L) 06/09/2016   PLT 106 (L) 06/09/2016    Recent Labs  05/12/16 0830 05/26/16 0855 06/09/16 0853  NA 137 134* 136  K 4.2 3.8 3.8  CL 106 105 106  CO2 '25 24 24  ' GLUCOSE 137* 138* 172*  BUN '11 10 10  ' CREATININE 0.78 0.75 0.77  CALCIUM 8.7* 8.4* 8.8*  GFRNONAA >60 >60 >60  GFRAA >60 >60 >60  PROT 7.1 6.8 7.1  ALBUMIN 3.8 3.6 3.7  AST 25 32 34  ALT '20 22 24  ' ALKPHOS 73 75 84  BILITOT 0.3 0.8 0.6    RADIOGRAPHIC STUDIES: I have personally reviewed  the radiological images as listed and agreed with the findings in the report. No results found.  ASSESSMENT & PLAN:   Cancer of ascending colon ([place]) # Ascending colon cancer; stage III on adjuvant chemotherapy with FOLFOX every 2 weeks 12. Given the positive margin- at surgery question need for radiation.   # Proceed with FOLFOX chemotherapy cycle #6  today. Patient tolerating chemotherapy well. Labs are adequate except platelets- 106  # skin rash -better.   # diarrhea- G-1. On imodium.   # Cold sensitivity/oxaliplatin grade 1- not an worse.   # Iron deficiency anemia- hemoglobin 13; PO iron q day.   # Flu shot is okay.   # Follow-up with me in 2 weeks with chemotherapy/labs.       Cammie Sickle, MD 06/09/2016 5:54 PM

## 2016-06-09 NOTE — Progress Notes (Signed)
Pt reports he had a reaction to the sun [place] the [place] eyes were tearing up and swelling.

## 2016-06-09 NOTE — Assessment & Plan Note (Signed)
#   Ascending colon cancer; stage III on adjuvant chemotherapy with FOLFOX every 2 weeks 12. Given the positive margin- at surgery question need for radiation.   # Proceed with FOLFOX chemotherapy cycle #6  today. Patient tolerating chemotherapy well. Labs are adequate except platelets- 106  # skin rash -better.   # diarrhea- G-1. On imodium.   # Cold sensitivity/oxaliplatin grade 1- not an worse.   # Iron deficiency anemia- hemoglobin 13; PO iron q day.   # Flu shot is okay.   # Follow-up with me in 2 weeks with chemotherapy/labs.

## 2016-06-11 ENCOUNTER — Inpatient Hospital Stay: Payer: BLUE CROSS/BLUE SHIELD

## 2016-06-11 DIAGNOSIS — C182 Malignant neoplasm of ascending colon: Secondary | ICD-10-CM

## 2016-06-11 MED ORDER — INFLUENZA VAC SPLIT QUAD 0.5 ML IM SUSY
0.5000 mL | PREFILLED_SYRINGE | Freq: Once | INTRAMUSCULAR | Status: AC
  Administered 2016-06-11: 0.5 mL via INTRAMUSCULAR
  Filled 2016-06-11: qty 0.5

## 2016-06-11 MED ORDER — HEPARIN SOD (PORK) LOCK FLUSH 100 UNIT/ML IV SOLN
500.0000 [IU] | Freq: Once | INTRAVENOUS | Status: AC | PRN
  Administered 2016-06-11: 500 [IU]
  Filled 2016-06-11: qty 5

## 2016-06-23 ENCOUNTER — Inpatient Hospital Stay (HOSPITAL_BASED_OUTPATIENT_CLINIC_OR_DEPARTMENT_OTHER): Payer: BLUE CROSS/BLUE SHIELD | Admitting: Internal Medicine

## 2016-06-23 ENCOUNTER — Inpatient Hospital Stay: Payer: BLUE CROSS/BLUE SHIELD

## 2016-06-23 VITALS — BP 128/84 | HR 80 | Temp 96.1°F | Resp 18 | Ht 66.0 in | Wt 188.4 lb

## 2016-06-23 DIAGNOSIS — N400 Enlarged prostate without lower urinary tract symptoms: Secondary | ICD-10-CM

## 2016-06-23 DIAGNOSIS — Z79899 Other long term (current) drug therapy: Secondary | ICD-10-CM

## 2016-06-23 DIAGNOSIS — Z23 Encounter for immunization: Secondary | ICD-10-CM

## 2016-06-23 DIAGNOSIS — D509 Iron deficiency anemia, unspecified: Secondary | ICD-10-CM | POA: Diagnosis not present

## 2016-06-23 DIAGNOSIS — R197 Diarrhea, unspecified: Secondary | ICD-10-CM

## 2016-06-23 DIAGNOSIS — K649 Unspecified hemorrhoids: Secondary | ICD-10-CM

## 2016-06-23 DIAGNOSIS — K219 Gastro-esophageal reflux disease without esophagitis: Secondary | ICD-10-CM

## 2016-06-23 DIAGNOSIS — C182 Malignant neoplasm of ascending colon: Secondary | ICD-10-CM

## 2016-06-23 DIAGNOSIS — M5430 Sciatica, unspecified side: Secondary | ICD-10-CM

## 2016-06-23 DIAGNOSIS — Z808 Family history of malignant neoplasm of other organs or systems: Secondary | ICD-10-CM

## 2016-06-23 DIAGNOSIS — Z9049 Acquired absence of other specified parts of digestive tract: Secondary | ICD-10-CM

## 2016-06-23 LAB — CBC WITH DIFFERENTIAL/PLATELET
Basophils Absolute: 0 10*3/uL (ref 0–0.1)
Basophils Relative: 1 %
EOS PCT: 1 %
Eosinophils Absolute: 0.1 10*3/uL (ref 0–0.7)
HCT: 40.6 % (ref 40.0–52.0)
Hemoglobin: 13.5 g/dL (ref 13.0–18.0)
LYMPHS ABS: 1.3 10*3/uL (ref 1.0–3.6)
LYMPHS PCT: 27 %
MCH: 27.3 pg (ref 26.0–34.0)
MCHC: 33.3 g/dL (ref 32.0–36.0)
MCV: 82.1 fL (ref 80.0–100.0)
Monocytes Absolute: 0.6 10*3/uL (ref 0.2–1.0)
Monocytes Relative: 12 %
Neutro Abs: 2.8 10*3/uL (ref 1.4–6.5)
Neutrophils Relative %: 59 %
PLATELETS: 108 10*3/uL — AB (ref 150–440)
RBC: 4.94 MIL/uL (ref 4.40–5.90)
RDW: 30.6 % — ABNORMAL HIGH (ref 11.5–14.5)
WBC: 4.8 10*3/uL (ref 3.8–10.6)

## 2016-06-23 LAB — COMPREHENSIVE METABOLIC PANEL
ALK PHOS: 80 U/L (ref 38–126)
ALT: 23 U/L (ref 17–63)
AST: 36 U/L (ref 15–41)
Albumin: 3.5 g/dL (ref 3.5–5.0)
Anion gap: 8 (ref 5–15)
BUN: 9 mg/dL (ref 6–20)
CALCIUM: 8.8 mg/dL — AB (ref 8.9–10.3)
CHLORIDE: 106 mmol/L (ref 101–111)
CO2: 23 mmol/L (ref 22–32)
CREATININE: 0.72 mg/dL (ref 0.61–1.24)
Glucose, Bld: 178 mg/dL — ABNORMAL HIGH (ref 65–99)
Potassium: 3.8 mmol/L (ref 3.5–5.1)
Sodium: 137 mmol/L (ref 135–145)
TOTAL PROTEIN: 7.1 g/dL (ref 6.5–8.1)
Total Bilirubin: 0.7 mg/dL (ref 0.3–1.2)

## 2016-06-23 MED ORDER — OXALIPLATIN CHEMO INJECTION 100 MG/20ML
85.0000 mg/m2 | Freq: Once | INTRAVENOUS | Status: AC
  Administered 2016-06-23: 175 mg via INTRAVENOUS
  Filled 2016-06-23: qty 35

## 2016-06-23 MED ORDER — SODIUM CHLORIDE 0.9% FLUSH
10.0000 mL | Freq: Once | INTRAVENOUS | Status: AC
  Administered 2016-06-23: 10 mL via INTRAVENOUS
  Filled 2016-06-23: qty 10

## 2016-06-23 MED ORDER — SODIUM CHLORIDE 0.9 % IV SOLN: 10.0000 mg | Freq: Once | INTRAVENOUS | Status: DC

## 2016-06-23 MED ORDER — FLUOROURACIL CHEMO INJECTION 2.5 GM/50ML
400.0000 mg/m2 | Freq: Once | INTRAVENOUS | Status: AC
  Administered 2016-06-23: 800 mg via INTRAVENOUS
  Filled 2016-06-23: qty 16

## 2016-06-23 MED ORDER — PALONOSETRON HCL INJECTION 0.25 MG/5ML
0.2500 mg | Freq: Once | INTRAVENOUS | Status: AC
  Administered 2016-06-23: 0.25 mg via INTRAVENOUS
  Filled 2016-06-23: qty 5

## 2016-06-23 MED ORDER — SODIUM CHLORIDE 0.9% FLUSH
10.0000 mL | INTRAVENOUS | Status: DC | PRN
  Filled 2016-06-23: qty 10

## 2016-06-23 MED ORDER — LEUCOVORIN CALCIUM INJECTION 350 MG
800.0000 mg | Freq: Once | INTRAVENOUS | Status: AC
  Administered 2016-06-23: 800 mg via INTRAVENOUS
  Filled 2016-06-23: qty 40

## 2016-06-23 MED ORDER — DEXAMETHASONE SODIUM PHOSPHATE 10 MG/ML IJ SOLN
10.0000 mg | Freq: Once | INTRAMUSCULAR | Status: AC
  Administered 2016-06-23: 10 mg via INTRAVENOUS
  Filled 2016-06-23: qty 1

## 2016-06-23 MED ORDER — DEXTROSE 5 % IV SOLN
Freq: Once | INTRAVENOUS | Status: AC
  Administered 2016-06-23: 09:00:00 via INTRAVENOUS
  Filled 2016-06-23: qty 1000

## 2016-06-23 MED ORDER — SODIUM CHLORIDE 0.9 % IV SOLN
2400.0000 mg/m2 | INTRAVENOUS | Status: DC
  Administered 2016-06-23: 4950 mg via INTRAVENOUS
  Filled 2016-06-23: qty 99

## 2016-06-23 NOTE — Progress Notes (Signed)
No major concerns today.  

## 2016-06-23 NOTE — Progress Notes (Signed)
[facility] NOTE  Patient Care Team: Marden Noble, MD as PCP - General (Internal Medicine) Marden Noble, MD (Internal Medicine) Christene Lye, MD (General Surgery)  CHIEF COMPLAINTS/PURPOSE OF CONSULTATION:  Oncology History   # June-July 2017-Ascending COLON CA STAGE III [pT3pN2 (4/14LN positive); POSITIVE RADIAL MARGIN; Dr.Sankar]; pre-op CEA- 1.5/N; MRI- liver-NEG; 03/26/2016- PET-NED   # June 24th 2017-  FOLFOX q 2 W  # IDA- [aug 2017] s/p IV venofer x4.    # MOLECULAR TESTING: MSI-STABLE; F-ONE-[july 2017] B- RAF V 600 E; Wild type Kras/N-ras ; others**     Cancer of ascending colon ([place])   02/26/2016 Initial Diagnosis    Cancer of ascending colon ([facility])        HISTORY OF PRESENTING ILLNESS:  Philip Shah 60 y.o.  male above history of stage III colon cancer is Currently on adjuvant FOLFOX.   Denies any skin rash. Intermittent diarrhea. No nausea no vomiting. He is trying to lose weight; he has lost a few pounds. Denies any significant tingling and numbness except for cold sensitivity.  No sores in the mouth.  No blood in stools black stools.   ROS: A complete 10 point review of system is done which is negative except mentioned above in history of present illness.   MEDICAL HISTORY:  Past Medical History:  Diagnosis Date  . Abnormal colonoscopy 02-2016  . Anemia   . Enlarged prostate   . GERD (gastroesophageal reflux disease)   . H/O vasectomy 89  . Hemorrhoids   . Hx of ligation of vein 1981  . Sciatica   . Status post colon resection 02-26-16    SURGICAL HISTORY: Past Surgical History:  Procedure Laterality Date  . APPENDECTOMY    . COLONOSCOPY WITH PROPOFOL N/A 02/11/2016   Procedure: COLONOSCOPY WITH PROPOFOL;  Surgeon: Christene Lye, MD;  Location: [facility];  Service: Endoscopy;  Laterality: N/A;  . ESOPHAGOGASTRODUODENOSCOPY (EGD) WITH PROPOFOL N/A 02/11/2016   Procedure: ESOPHAGOGASTRODUODENOSCOPY  (EGD) WITH PROPOFOL;  Surgeon: Christene Lye, MD;  Location: [facility];  Service: Endoscopy;  Laterality: N/A;  . LAPAROSCOPIC RIGHT COLECTOMY Right 02/26/2016   Procedure: LAPAROSCOPIC RIGHT COLECTOMY;  Surgeon: Christene Lye, MD;  Location: [facility];  Service: General;  Laterality: Right;  . PORTACATH PLACEMENT N/A 03/18/2016   Procedure: INSERTION PORT-A-CATH;  Surgeon: Christene Lye, MD;  Location: [facility];  Service: General;  Laterality: N/A;  . VASECTOMY    . 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 Marital status: Single    Spouse name: N/A  . Number of children: N/A  . Years of education: N/A   Occupational History  . Not on file.   Social History Main Topics  . Smoking status: Never Smoker  . Smokeless tobacco: Never Used  . Alcohol use No     Comment: 3/day  . Drug use: No  . Sexual activity: Not on file   Other Topics Concern  . Not on file   Social History Narrative  . No narrative on file    FAMILY HISTORY: mat aunt- anal cancer; oldest brother/pre-cancerous polyps- [Dr. In [place]] Family History  Problem Relation Age of Onset  . Kidney failure Mother   . Cancer Father     spine    ALLERGIES:  is allergic to penicillins.  MEDICATIONS:  Current Outpatient Prescriptions  Medication Sig Dispense Refill  . ferrous sulfate (FERROUSUL) 325 (65 FE) MG tablet Take 1 tablet (325 mg total) by mouth daily with breakfast. 30 tablet 3  . lidocaine-prilocaine (EMLA) cream Apply 1 application topically as needed. Apply generously over the Mediport 45 minutes prior to chemotherapy. 30 g 0  . Multiple Vitamin (MULTIVITAMIN WITH MINERALS) TABS tablet Take 1 tablet by mouth daily.    . ondansetron (ZOFRAN) 8 MG tablet Take 1 tablet (8 mg total) by mouth every 8 (eight) hours as needed for nausea or  vomiting (start 3 days; after chemo). (Patient not taking: Reported on 06/23/2016) 40 tablet 1  . prochlorperazine (COMPAZINE) 10 MG tablet Take 1 tablet (10 mg total) by mouth every 6 (six) hours as needed for nausea or vomiting. (Patient not taking: Reported on 06/23/2016) 40 tablet 1  . traMADol (ULTRAM) 50 MG tablet Take 1 tablet (50 mg total) by mouth every 6 (six) hours as needed. (Patient not taking: Reported on 06/23/2016) 30 tablet 0   No current facility-administered medications for this visit.    Facility-Administered Medications Ordered in Other Visits  Medication Dose Route Frequency Provider Last Rate Last Dose  . Influenza vac split quadrivalent PF (FLUARIX) injection 0.5 mL  0.5 mL Intramuscular Once Govinda R Brahmanday, MD          .  PHYSICAL EXAMINATION: ECOG PERFORMANCE STATUS: 0 - Asymptomatic  Vitals:   06/23/16 0845  BP: 128/84  Pulse: 80  Resp: 18  Temp: (!) 96.1 F (35.6 C)   Filed Weights   06/23/16 0845  Weight: 188 lb 6.4 oz (85.5 kg)    GENERAL: Well-nourished well-developed; Alert, no distress and comfortable. He is alone. EYES: no pallor or icterus OROPHARYNX: no thrush or ulceration; good dentition  NECK: supple, no masses felt LYMPH:  no palpable lymphadenopathy in the cervical, axillary or inguinal regions LUNGS: clear to auscultation and  No wheeze or crackles HEART/CVS: regular rate & rhythm and no murmurs; No lower extremity edema ABDOMEN: abdomen soft, non-tender and normal bowel sounds.  Musculoskeletal:no cyanosis of digits and no clubbing  PSYCH: alert & oriented x 3 with fluent speech NEURO: no focal motor/sensory deficits SKIN:  no rashes or significant lesions  LABORATORY DATA:  I have reviewed the data as listed Lab Results  Component Value Date   WBC 4.8 06/23/2016   HGB 13.5 06/23/2016   HCT 40.6 06/23/2016   MCV 82.1 06/23/2016   PLT 108 (L) 06/23/2016    Recent Labs  05/26/16 0855 06/09/16 0853 06/23/16 0824   NA 134* 136 137  K 3.8 3.8 3.8  CL 105 106 106  CO2 24 24 23  GLUCOSE 138* 172* 178*  BUN 10 10 9  CREATININE 0.75 0.77 0.72  CALCIUM 8.4* 8.8* 8.8*  GFRNONAA >60 >60 >60  GFRAA >60 >60 >60  PROT 6.8 7.1 7.1  ALBUMIN 3.6 3.7 3.5  AST 32 34 36  ALT 22 24 23  ALKPHOS 75 84 80  BILITOT 0.8 0.6 0.7    RADIOGRAPHIC STUDIES: I have personally reviewed the radiological images as listed and agreed with the findings in the report. No results found.  ASSESSMENT & PLAN:   Cancer of ascending colon ([facility]) # Ascending colon cancer; stage III on adjuvant chemotherapy with FOLFOX every 2 weeks 12. Given the positive margin- at surgery question need for radiation. Will refer to Dr.Crystal.   # Proceed with FOLFOX chemotherapy cycle #7     today. Patient tolerating chemotherapy well. Labs are adequate except platelets- 108  # diarrhea- G-1. On lomotil prn.  # Cold sensitivity/oxaliplatin grade 1- not an worse.   # Iron deficiency anemia- leg cramps resolved; hemoglobin 13; PO iron q day.   # Follow-up with me in 2 weeks with chemotherapy/labs.       Govinda R Brahmanday, MD 06/23/2016 5:20 PM 

## 2016-06-23 NOTE — Assessment & Plan Note (Addendum)
#   Ascending colon cancer; stage III on adjuvant chemotherapy with FOLFOX every 2 weeks 12. Given the positive margin- at surgery question need for radiation. Will refer to Dr.Crystal.   # Proceed with FOLFOX chemotherapy cycle #7   today. Patient tolerating chemotherapy well. Labs are adequate except platelets- 108  # diarrhea- G-1. On lomotil prn.  # Cold sensitivity/oxaliplatin grade 1- not an worse.   # Iron deficiency anemia- leg cramps resolved; hemoglobin 13; PO iron q day.   # Follow-up with me in 2 weeks with chemotherapy/labs.

## 2016-06-25 ENCOUNTER — Inpatient Hospital Stay: Payer: BLUE CROSS/BLUE SHIELD

## 2016-06-25 DIAGNOSIS — C182 Malignant neoplasm of ascending colon: Secondary | ICD-10-CM | POA: Diagnosis not present

## 2016-06-25 DIAGNOSIS — C801 Malignant (primary) neoplasm, unspecified: Secondary | ICD-10-CM

## 2016-06-25 MED ORDER — SODIUM CHLORIDE 0.9 % IJ SOLN
10.0000 mL | Freq: Once | INTRAMUSCULAR | Status: AC
  Administered 2016-06-25: 10 mL via INTRAVENOUS
  Filled 2016-06-25: qty 10

## 2016-06-25 MED ORDER — HEPARIN SOD (PORK) LOCK FLUSH 100 UNIT/ML IV SOLN
500.0000 [IU] | Freq: Once | INTRAVENOUS | Status: AC
  Administered 2016-06-25: 500 [IU] via INTRAVENOUS

## 2016-07-07 ENCOUNTER — Inpatient Hospital Stay (HOSPITAL_BASED_OUTPATIENT_CLINIC_OR_DEPARTMENT_OTHER): Payer: BLUE CROSS/BLUE SHIELD | Admitting: Internal Medicine

## 2016-07-07 ENCOUNTER — Inpatient Hospital Stay: Payer: BLUE CROSS/BLUE SHIELD

## 2016-07-07 ENCOUNTER — Inpatient Hospital Stay: Payer: BLUE CROSS/BLUE SHIELD | Attending: Internal Medicine

## 2016-07-07 DIAGNOSIS — R202 Paresthesia of skin: Secondary | ICD-10-CM | POA: Insufficient documentation

## 2016-07-07 DIAGNOSIS — Z79899 Other long term (current) drug therapy: Secondary | ICD-10-CM

## 2016-07-07 DIAGNOSIS — N400 Enlarged prostate without lower urinary tract symptoms: Secondary | ICD-10-CM | POA: Diagnosis not present

## 2016-07-07 DIAGNOSIS — D509 Iron deficiency anemia, unspecified: Secondary | ICD-10-CM | POA: Insufficient documentation

## 2016-07-07 DIAGNOSIS — C182 Malignant neoplasm of ascending colon: Secondary | ICD-10-CM | POA: Insufficient documentation

## 2016-07-07 DIAGNOSIS — R200 Anesthesia of skin: Secondary | ICD-10-CM | POA: Insufficient documentation

## 2016-07-07 DIAGNOSIS — M5430 Sciatica, unspecified side: Secondary | ICD-10-CM | POA: Insufficient documentation

## 2016-07-07 DIAGNOSIS — Z5111 Encounter for antineoplastic chemotherapy: Secondary | ICD-10-CM | POA: Insufficient documentation

## 2016-07-07 DIAGNOSIS — Z9049 Acquired absence of other specified parts of digestive tract: Secondary | ICD-10-CM | POA: Diagnosis not present

## 2016-07-07 DIAGNOSIS — K219 Gastro-esophageal reflux disease without esophagitis: Secondary | ICD-10-CM | POA: Diagnosis not present

## 2016-07-07 DIAGNOSIS — G629 Polyneuropathy, unspecified: Secondary | ICD-10-CM | POA: Insufficient documentation

## 2016-07-07 DIAGNOSIS — R197 Diarrhea, unspecified: Secondary | ICD-10-CM | POA: Insufficient documentation

## 2016-07-07 LAB — COMPREHENSIVE METABOLIC PANEL
ALBUMIN: 3.7 g/dL (ref 3.5–5.0)
ALK PHOS: 90 U/L (ref 38–126)
ALT: 27 U/L (ref 17–63)
AST: 39 U/L (ref 15–41)
Anion gap: 5 (ref 5–15)
BUN: 13 mg/dL (ref 6–20)
CALCIUM: 8.7 mg/dL — AB (ref 8.9–10.3)
CO2: 25 mmol/L (ref 22–32)
CREATININE: 0.88 mg/dL (ref 0.61–1.24)
Chloride: 106 mmol/L (ref 101–111)
GFR calc Af Amer: >60 mL/min (ref >60)
GFR calc non Af Amer: >60 mL/min (ref >60)
GLUCOSE: 146 mg/dL — AB (ref 65–99)
Potassium: 3.8 mmol/L (ref 3.5–5.1)
SODIUM: 136 mmol/L (ref 135–145)
Total Bilirubin: 0.7 mg/dL (ref 0.3–1.2)
Total Protein: 7.1 g/dL (ref 6.5–8.1)

## 2016-07-07 LAB — CBC WITH DIFFERENTIAL/PLATELET
BASOS PCT: 1 %
Basophils Absolute: 0 10*3/uL (ref 0–0.1)
EOS ABS: 0.1 10*3/uL (ref 0–0.7)
Eosinophils Relative: 1 %
HEMATOCRIT: 39.7 % — AB (ref 40.0–52.0)
HEMOGLOBIN: 13.6 g/dL (ref 13.0–18.0)
LYMPHS ABS: 1.1 10*3/uL (ref 1.0–3.6)
Lymphocytes Relative: 26 %
MCH: 28.7 pg (ref 26.0–34.0)
MCHC: 34.2 g/dL (ref 32.0–36.0)
MCV: 84 fL (ref 80.0–100.0)
Monocytes Absolute: 0.6 10*3/uL (ref 0.2–1.0)
Monocytes Relative: 14 %
NEUTROS ABS: 2.6 10*3/uL (ref 1.4–6.5)
NEUTROS PCT: 58 %
Platelets: 93 10*3/uL — ABNORMAL LOW (ref 150–440)
RBC: 4.72 MIL/uL (ref 4.40–5.90)
RDW: 28.1 % — ABNORMAL HIGH (ref 11.5–14.5)
WBC: 4.4 10*3/uL (ref 3.8–10.6)

## 2016-07-07 MED ORDER — DEXTROSE 5 % IV SOLN
Freq: Once | INTRAVENOUS | Status: AC
  Administered 2016-07-07: 09:00:00 via INTRAVENOUS
  Filled 2016-07-07: qty 1000

## 2016-07-07 MED ORDER — OXALIPLATIN CHEMO INJECTION 100 MG/20ML
85.0000 mg/m2 | Freq: Once | INTRAVENOUS | Status: AC
  Administered 2016-07-07: 175 mg via INTRAVENOUS
  Filled 2016-07-07: qty 35

## 2016-07-07 MED ORDER — SODIUM CHLORIDE 0.9 % IV SOLN: 10.0000 mg | Freq: Once | INTRAVENOUS | Status: DC

## 2016-07-07 MED ORDER — FLUOROURACIL CHEMO INJECTION 2.5 GM/50ML
400.0000 mg/m2 | Freq: Once | INTRAVENOUS | Status: AC
  Administered 2016-07-07: 800 mg via INTRAVENOUS
  Filled 2016-07-07: qty 16

## 2016-07-07 MED ORDER — SODIUM CHLORIDE 0.9 % IV SOLN
2400.0000 mg/m2 | INTRAVENOUS | Status: DC
  Administered 2016-07-07: 4950 mg via INTRAVENOUS
  Filled 2016-07-07: qty 99

## 2016-07-07 MED ORDER — LEUCOVORIN CALCIUM INJECTION 350 MG
800.0000 mg | Freq: Once | INTRAMUSCULAR | Status: AC
  Administered 2016-07-07: 800 mg via INTRAVENOUS
  Filled 2016-07-07: qty 35

## 2016-07-07 MED ORDER — PALONOSETRON HCL INJECTION 0.25 MG/5ML
0.2500 mg | Freq: Once | INTRAVENOUS | Status: AC
  Administered 2016-07-07: 0.25 mg via INTRAVENOUS
  Filled 2016-07-07: qty 5

## 2016-07-07 MED ORDER — SODIUM CHLORIDE 0.9% FLUSH
10.0000 mL | INTRAVENOUS | Status: DC | PRN
  Administered 2016-07-07: 10 mL
  Filled 2016-07-07: qty 10

## 2016-07-07 MED ORDER — DEXAMETHASONE SODIUM PHOSPHATE 10 MG/ML IJ SOLN
10.0000 mg | Freq: Once | INTRAMUSCULAR | Status: AC
  Administered 2016-07-07: 10 mg via INTRAVENOUS
  Filled 2016-07-07: qty 1

## 2016-07-07 NOTE — Progress Notes (Signed)
[place] NOTE  Patient Care Team: Marden Noble, MD as PCP - General (Internal Medicine) Marden Noble, MD (Internal Medicine) Christene Lye, MD (General Surgery)  CHIEF COMPLAINTS/PURPOSE OF CONSULTATION:  Oncology History   # June-July 2017-Ascending COLON CA STAGE III [pT3pN2 (4/14LN positive); POSITIVE RADIAL MARGIN; Dr.Sankar]; pre-op CEA- 1.5/N; MRI- liver-NEG; 03/26/2016- PET-NED   # June 24th 2017-  FOLFOX q 2 W  # IDA- [aug 2017] s/p IV venofer x4.    # MOLECULAR TESTING: MSI-STABLE; F-ONE-[july 2017] B- RAF V 600 E; Wild type [place]ras/N-ras ; others**     Cancer of ascending colon ([place])   02/26/2016 Initial Diagnosis    Cancer of ascending colon ([facility])        HISTORY OF PRESENTING ILLNESS:  Philip Shah 60 y.o.  male above history of stage III colon cancer is Currently on adjuvant FOLFOX.  Mild intermittent diarrhea. Mild cold sensitivity. Otherwise her tingling and numbness. No bleeding. No nausea vomiting.    ROS: A complete 10 point review of system is done which is negative except mentioned above in history of present illness.   MEDICAL HISTORY:  Past Medical History:  Diagnosis Date  . Abnormal colonoscopy 02-2016  . Anemia   . Enlarged prostate   . GERD (gastroesophageal reflux disease)   . H/O vasectomy 56  . Hemorrhoids   . Hx of ligation of vein 1981  . Sciatica   . Status post colon resection 02-26-16    SURGICAL HISTORY: Past Surgical History:  Procedure Laterality Date  . APPENDECTOMY    . COLONOSCOPY WITH PROPOFOL N/A 02/11/2016   Procedure: COLONOSCOPY WITH PROPOFOL;  Surgeon: Christene Lye, MD;  Location: [facility];  Service: Endoscopy;  Laterality: N/A;  . ESOPHAGOGASTRODUODENOSCOPY (EGD) WITH PROPOFOL N/A 02/11/2016   Procedure: ESOPHAGOGASTRODUODENOSCOPY (EGD) WITH PROPOFOL;  Surgeon: Christene Lye, MD;  Location: [facility] END[facility];  Service: Endoscopy;  Laterality: N/A;  .  LAPAROSCOPIC RIGHT COLECTOMY Right 02/26/2016   Procedure: LAPAROSCOPIC RIGHT COLECTOMY;  Surgeon: Christene Lye, MD;  Location: [facility] ORS;  Service: General;  Laterality: Right;  . PORTACATH PLACEMENT N/A 03/18/2016   Procedure: INSERTION PORT-A-CATH;  Surgeon: Christene Lye, MD;  Location: [facility] ORS;  Service: General;  Laterality: N/A;  . VASECTOMY    . 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 Marital status: Single    Spouse name: N/A  . Number of children: N/A  . Years of education: N/A   Occupational History  . Not on file.   Social History Main Topics  . Smoking status: Never Smoker  . Smokeless tobacco: Never Used  . Alcohol use No     Comment: 3/day  . Drug use: No  . Sexual activity: Not on file   Other Topics Concern  . Not on file   Social History Narrative  . No narrative on file    FAMILY HISTORY: mat aunt- anal cancer; oldest brother/pre-cancerous polyps- [Dr. In [place]] Family History  Problem Relation Age of Onset  . Kidney failure Mother   . Cancer Father     spine    ALLERGIES:  is allergic to penicillins.  MEDICATIONS:  Current Outpatient Prescriptions  Medication Sig Dispense Refill  . ferrous sulfate (FERROUSUL) 325 (65 FE) MG tablet Take 1 tablet (325 mg total) by mouth  daily with breakfast. 30 tablet 3  . lidocaine-prilocaine (EMLA) cream Apply 1 application topically as needed. Apply generously over the Mediport 45 minutes prior to chemotherapy. 30 g 0  . Multiple Vitamin (MULTIVITAMIN WITH MINERALS) TABS tablet Take 1 tablet by mouth daily.    . ondansetron (ZOFRAN) 8 MG tablet Take 1 tablet (8 mg total) by mouth every 8 (eight) hours as needed for nausea or vomiting (start 3 days; after chemo). (Patient not taking: Reported on 07/07/2016) 40 tablet 1  . prochlorperazine (COMPAZINE) 10  MG tablet Take 1 tablet (10 mg total) by mouth every 6 (six) hours as needed for nausea or vomiting. (Patient not taking: Reported on 07/07/2016) 40 tablet 1  . traMADol (ULTRAM) 50 MG tablet Take 1 tablet (50 mg total) by mouth every 6 (six) hours as needed. (Patient not taking: Reported on 07/07/2016) 30 tablet 0   No current facility-administered medications for this visit.    Facility-Administered Medications Ordered in Other Visits  Medication Dose Route Frequency Provider Last Rate Last Dose  . Influenza vac split quadrivalent PF (FLUARIX) injection 0.5 mL  0.5 mL Intramuscular Once Cammie Sickle, MD          .  PHYSICAL EXAMINATION: ECOG PERFORMANCE STATUS: 0 - Asymptomatic  Vitals:   07/07/16 0849  BP: 121/77  Pulse: 82  Resp: 18  Temp: (!) 96.5 F (35.8 C)   Filed Weights   07/07/16 0849  Weight: 187 lb 9.8 oz (85.1 kg)    GENERAL: Well-nourished well-developed; Alert, no distress and comfortable. He is alone. EYES: no pallor or icterus OROPHARYNX: no thrush or ulceration; good dentition  NECK: supple, no masses felt LYMPH:  no palpable lymphadenopathy in the cervical, axillary or inguinal regions LUNGS: clear to auscultation and  No wheeze or crackles HEART/CVS: regular rate & rhythm and no murmurs; No lower extremity edema ABDOMEN: abdomen soft, non-tender and normal bowel sounds.  Musculoskeletal:no cyanosis of digits and no clubbing  PSYCH: alert & oriented x 3 with fluent speech NEURO: no focal motor/sensory deficits SKIN:  no rashes or significant lesions  LABORATORY DATA:  I have reviewed the data as listed Lab Results  Component Value Date   WBC 4.4 07/07/2016   HGB 13.6 07/07/2016   HCT 39.7 (L) 07/07/2016   MCV 84.0 07/07/2016   PLT 93 (L) 07/07/2016    Recent Labs  06/09/16 0853 06/23/16 0824 07/07/16 0820  NA 136 137 136  K 3.8 3.8 3.8  CL 106 106 106  CO2 '24 23 25  ' GLUCOSE 172* 178* 146*  BUN '10 9 13  ' CREATININE 0.77 0.72 0.88   CALCIUM 8.8* 8.8* 8.7*  GFRNONAA >60 >60 >60  GFRAA >60 >60 >60  PROT 7.1 7.1 7.1  ALBUMIN 3.7 3.5 3.7  AST 34 36 39  ALT '24 23 27  ' ALKPHOS 84 80 90  BILITOT 0.6 0.7 0.7    RADIOGRAPHIC STUDIES: I have personally reviewed the radiological images as listed and agreed with the findings in the report. No results found.  ASSESSMENT & PLAN:   Cancer of ascending colon ([place]) # Ascending colon cancer; stage III on adjuvant chemotherapy with FOLFOX every 2 weeks 12. Given the positive margin- at surgery question need for radiation. Will refer to Dr.Crystal.  Pt hesitant; but open to discussion re: possibility of radiation. If Rt indicated; plan post RT.    # Proceed with FOLFOX chemotherapy cycle #8   today. Patient tolerating chemotherapy well. Labs are adequate except  platelets- 93  # diarrhea- G-1. On lomotil prn.  # Cold sensitivity/oxaliplatin grade 1- not an worse.   # Follow-up with me in 2 weeks with chemotherapy/labs.      Cammie Sickle, MD 07/07/2016 5:59 PM

## 2016-07-07 NOTE — Assessment & Plan Note (Addendum)
#   Ascending colon cancer; stage III on adjuvant chemotherapy with FOLFOX every 2 weeks 12. Given the positive margin- at surgery question need for radiation. Will refer to Dr.Crystal.  Pt hesitant; but open to discussion re: possibility of radiation. If Rt indicated; plan post RT.    # Proceed with FOLFOX chemotherapy cycle #8   today. Patient tolerating chemotherapy well. Labs are adequate except platelets- 93  # diarrhea- G-1. On lomotil prn.  # Cold sensitivity/oxaliplatin grade 1- not an worse.   # Follow-up with me in 2 weeks with chemotherapy/labs.

## 2016-07-07 NOTE — Progress Notes (Signed)
Platelets: 93,000. MD, Dr. Burlene Arnt, already aware. Per MD order: proceed with treatment today.

## 2016-07-07 NOTE — Progress Notes (Signed)
Patient is here for follow up, he is doing well no complaints  

## 2016-07-09 ENCOUNTER — Inpatient Hospital Stay: Payer: BLUE CROSS/BLUE SHIELD

## 2016-07-09 DIAGNOSIS — C182 Malignant neoplasm of ascending colon: Secondary | ICD-10-CM

## 2016-07-09 MED ORDER — HEPARIN SOD (PORK) LOCK FLUSH 100 UNIT/ML IV SOLN
500.0000 [IU] | Freq: Once | INTRAVENOUS | Status: AC | PRN
  Administered 2016-07-09: 500 [IU]

## 2016-07-09 MED ORDER — SODIUM CHLORIDE 0.9% FLUSH
10.0000 mL | INTRAVENOUS | Status: DC | PRN
  Administered 2016-07-09: 10 mL
  Filled 2016-07-09: qty 10

## 2016-07-09 NOTE — Progress Notes (Signed)
Pt in this am, stating he accidently pulled his port needle out during the night. 21ml left in pump. Spoke with Dr Rogue Bussing. Pt reaccessed, flushed.

## 2016-07-14 ENCOUNTER — Encounter: Payer: Self-pay | Admitting: Radiation Oncology

## 2016-07-14 ENCOUNTER — Ambulatory Visit
Admission: RE | Admit: 2016-07-14 | Discharge: 2016-07-14 | Disposition: A | Discharge status: Home / Self Care | Payer: BLUE CROSS/BLUE SHIELD | Source: Ambulatory Visit | Attending: Radiation Oncology | Admitting: Radiation Oncology

## 2016-07-14 VITALS — BP 177/88 | HR 93 | Temp 98.2°F | Wt 187.2 lb

## 2016-07-14 DIAGNOSIS — K769 Liver disease, unspecified: Secondary | ICD-10-CM | POA: Insufficient documentation

## 2016-07-14 DIAGNOSIS — Z9221 Personal history of antineoplastic chemotherapy: Secondary | ICD-10-CM | POA: Diagnosis not present

## 2016-07-14 DIAGNOSIS — K219 Gastro-esophageal reflux disease without esophagitis: Secondary | ICD-10-CM | POA: Diagnosis not present

## 2016-07-14 DIAGNOSIS — N400 Enlarged prostate without lower urinary tract symptoms: Secondary | ICD-10-CM | POA: Diagnosis not present

## 2016-07-14 DIAGNOSIS — Z79899 Other long term (current) drug therapy: Secondary | ICD-10-CM | POA: Insufficient documentation

## 2016-07-14 DIAGNOSIS — K649 Unspecified hemorrhoids: Secondary | ICD-10-CM | POA: Insufficient documentation

## 2016-07-14 DIAGNOSIS — C182 Malignant neoplasm of ascending colon: Secondary | ICD-10-CM | POA: Diagnosis present

## 2016-07-14 DIAGNOSIS — D649 Anemia, unspecified: Secondary | ICD-10-CM | POA: Insufficient documentation

## 2016-07-14 DIAGNOSIS — Z809 Family history of malignant neoplasm, unspecified: Secondary | ICD-10-CM | POA: Insufficient documentation

## 2016-07-14 NOTE — Consult Note (Signed)
NEW PATIENT EVALUATION  Name: Philip Shah  MRN: [id number]  Date:   07/14/2016     DOB: 04/24/1956   This 60 y.o. male patient presents to the clinic for initial evaluation of stage IIIa (T4 N2 M0 adenocarcinoma ascending colon.Marland Kitchen  REFERRING PHYSICIAN: Marden Noble, MD  CHIEF COMPLAINT:  Chief Complaint  Patient presents with  . Colon Cancer    DIAGNOSIS: The encounter diagnosis was Cancer of ascending colon ([place]).   PREVIOUS INVESTIGATIONS:  CT scans reviewed  noting clinical notes reviewed Pathology reports reviewed  HPI: Patient is a 60 year old male who presented with Pap found anemia a colonoscopy was found to have an ascending colon mass biopsy positive for adenocarcinoma. CT scan demonstrated a 6.5 x 5.2 infiltrative mass along ascending colon with evidence of direct extension sought beyond the serosal surface: Also enlarged lymph nodes. He did also have some small low-attenuation lesions in his liver metastatic disease cannot be excluded. Patient underwent laparoscopic resection and At the time of surgery tumor was adherent to the abdominal wall. Right colectomy showed invasive adenocarcinoma with extracapsular extension of carcinoma with focal matted lymph nodes. Mismatch repair proteins showed low probability of MS I-H. Patient has been started on FOLFOX chemotherapy every 2 weeks which is tolerated well. He is doing well by mouth intake is good no evidence of diarrhea abdominal pain or discomfort or anemia. His case was presented at our tumor conference and based on the T4 nature of his lesion I thought adjuvant radiation therapy may be in order. He is seen today for opinion. Postop PET CT scan showed no distinctive evidence of residual tumor or metastatic disease.  PLANNED TREATMENT REGIMEN: Involved field radiation therapy  PAST MEDICAL HISTORY:  has a past medical history of Abnormal colonoscopy (02-2016); Anemia; Enlarged prostate; GERD (gastroesophageal reflux  disease); H/O vasectomy (1990); Hemorrhoids; ligation of vein (1981); Sciatica; and Status post colon resection (02-26-16).    PAST SURGICAL HISTORY:  Past Surgical History:  Procedure Laterality Date  . APPENDECTOMY    . COLONOSCOPY WITH PROPOFOL N/A 02/11/2016   Procedure: COLONOSCOPY WITH PROPOFOL;  Surgeon: Christene Lye, MD;  Location: [facility];  Service: Endoscopy;  Laterality: N/A;  . ESOPHAGOGASTRODUODENOSCOPY (EGD) WITH PROPOFOL N/A 02/11/2016   Procedure: ESOPHAGOGASTRODUODENOSCOPY (EGD) WITH PROPOFOL;  Surgeon: Christene Lye, MD;  Location: [facility];  Service: Endoscopy;  Laterality: N/A;  . LAPAROSCOPIC RIGHT COLECTOMY Right 02/26/2016   Procedure: LAPAROSCOPIC RIGHT COLECTOMY;  Surgeon: Christene Lye, MD;  Location: [facility];  Service: General;  Laterality: Right;  . PORTACATH PLACEMENT N/A 03/18/2016   Procedure: INSERTION PORT-A-CATH;  Surgeon: Christene Lye, MD;  Location: [facility];  Service: General;  Laterality: N/A;  . VASECTOMY    . VEIN LIGATION Right 1980's   Dr Pat Kocher    FAMILY HISTORY: family history includes Cancer in his father; Kidney failure in his mother.  SOCIAL HISTORY:  reports that he has never smoked. He has never used smokeless tobacco. He reports that he does not drink alcohol or use drugs.  ALLERGIES: Penicillins  MEDICATIONS:  Current Outpatient Prescriptions  Medication Sig Dispense Refill  . ferrous sulfate (FERROUSUL) 325 (65 FE) MG tablet Take 1 tablet (325 mg total) by mouth daily with breakfast. 30 tablet 3  . lidocaine-prilocaine (EMLA) cream Apply 1 application topically as needed. Apply generously over the Mediport 45 minutes prior to chemotherapy. 30 g 0  . Multiple Vitamin (MULTIVITAMIN WITH MINERALS) TABS tablet Take 1 tablet by mouth  daily.    . ondansetron (ZOFRAN) 8 MG tablet Take 1 tablet (8 mg total) by mouth every 8 (eight) hours as needed for nausea or vomiting (start 3 days; after  chemo). 40 tablet 1  . prochlorperazine (COMPAZINE) 10 MG tablet Take 1 tablet (10 mg total) by mouth every 6 (six) hours as needed for nausea or vomiting. 40 tablet 1  . traMADol (ULTRAM) 50 MG tablet Take 1 tablet (50 mg total) by mouth every 6 (six) hours as needed. 30 tablet 0   No current facility-administered medications for this encounter.    Facility-Administered Medications Ordered in Other Encounters  Medication Dose Route Frequency Provider Last Rate Last Dose  . Influenza vac split quadrivalent PF (FLUARIX) injection 0.5 mL  0.5 mL Intramuscular Once Cammie Sickle, MD        ECOG PERFORMANCE STATUS:  0 - Asymptomatic  REVIEW OF SYSTEMS:  Patient denies any weight loss, fatigue, weakness, fever, chills or night sweats. Patient denies any loss of vision, blurred vision. Patient denies any ringing  of the ears or hearing loss. No irregular heartbeat. Patient denies heart murmur or history of fainting. Patient denies any chest pain or pain radiating to her upper extremities. Patient denies any shortness of breath, difficulty breathing at night, cough or hemoptysis. Patient denies any swelling in the lower legs. Patient denies any nausea vomiting, vomiting of blood, or coffee ground material in the vomitus. Patient denies any stomach pain. Patient states has had normal bowel movements no significant constipation or diarrhea. Patient denies any dysuria, hematuria or significant nocturia. Patient denies any problems walking, swelling in the joints or loss of balance. Patient denies any skin changes, loss of hair or loss of weight. Patient denies any excessive worrying or anxiety or significant depression. Patient denies any problems with insomnia. Patient denies excessive thirst, polyuria, polydipsia. Patient denies any swollen glands, patient denies easy bruising or easy bleeding. Patient denies any recent infections, allergies or URI. Patient "s visual fields have not changed  significantly in recent time.    PHYSICAL EXAM: BP (!) 177/88   Pulse 93   Temp 98.2 F (36.8 C)   Wt 187 lb 2.7 oz (84.9 kg)   BMI 30.21 kg/m  Well-developed well-nourished patient in NAD. HEENT reveals PERLA, EOMI, discs not visualized.  Oral cavity is clear. No oral mucosal lesions are identified. Neck is clear without evidence of cervical or supraclavicular adenopathy. Lungs are clear to A&P. Cardiac examination is essentially unremarkable with regular rate and rhythm without murmur rub or thrill. Abdomen is benign with no organomegaly or masses noted. Motor sensory and DTR levels are equal and symmetric in the upper and lower extremities. Cranial nerves II through XII are grossly intact. Proprioception is intact. No peripheral adenopathy or edema is identified. No motor or sensory levels are noted. Crude visual fields are within normal range. Patient is well-healed midline incision. No evidence of mass or nodularity in the right lower quadrant of his abdomen is noted.  LABORATORY DATA: Pathology reports reviewed    RADIOLOGY RESULTS: CT scans and PET/CT scan reviewed   IMPRESSION: Stage III T4 N2 M0) adenocarcinoma the ascending colon status post right colectomy in 60 year old male  PLAN: At this time based on the locally advanced nature of his disease although it is controversial to add radiation therapy I believe a young healthy male with tumor extending to the abdominal wall as well as involvement of matted lymph nodes in extension outside of the colon a  short course of radiation therapy up to 4500 cGy over 5 weeks this should be recommended. Risks and benefits of those treatments including possible diarrhea alteration of blood counts skin reaction fatigue all were discussed in detail with the patient. He seems to comprehend my treatment plan well and wants to proceed with treatment. I will wait about another month totally is completing his FOLFOX chemotherapy and I have personally  ordered CT simulation at that time.  I would like to take this opportunity to thank you for allowing me to participate in the care of your patient.Armstead Peaks., MD

## 2016-07-21 ENCOUNTER — Inpatient Hospital Stay (HOSPITAL_BASED_OUTPATIENT_CLINIC_OR_DEPARTMENT_OTHER): Payer: BLUE CROSS/BLUE SHIELD | Admitting: Internal Medicine

## 2016-07-21 ENCOUNTER — Inpatient Hospital Stay: Payer: BLUE CROSS/BLUE SHIELD

## 2016-07-21 ENCOUNTER — Inpatient Hospital Stay (HOSPITAL_BASED_OUTPATIENT_CLINIC_OR_DEPARTMENT_OTHER): Payer: BLUE CROSS/BLUE SHIELD

## 2016-07-21 VITALS — BP 113/77 | HR 84 | Temp 97.3°F | Resp 20 | Ht 66.0 in | Wt 188.0 lb

## 2016-07-21 DIAGNOSIS — R197 Diarrhea, unspecified: Secondary | ICD-10-CM

## 2016-07-21 DIAGNOSIS — D509 Iron deficiency anemia, unspecified: Secondary | ICD-10-CM | POA: Diagnosis not present

## 2016-07-21 DIAGNOSIS — C182 Malignant neoplasm of ascending colon: Secondary | ICD-10-CM

## 2016-07-21 DIAGNOSIS — Z9049 Acquired absence of other specified parts of digestive tract: Secondary | ICD-10-CM

## 2016-07-21 DIAGNOSIS — Z79899 Other long term (current) drug therapy: Secondary | ICD-10-CM

## 2016-07-21 DIAGNOSIS — R202 Paresthesia of skin: Secondary | ICD-10-CM

## 2016-07-21 DIAGNOSIS — R200 Anesthesia of skin: Secondary | ICD-10-CM

## 2016-07-21 DIAGNOSIS — N400 Enlarged prostate without lower urinary tract symptoms: Secondary | ICD-10-CM

## 2016-07-21 DIAGNOSIS — K219 Gastro-esophageal reflux disease without esophagitis: Secondary | ICD-10-CM

## 2016-07-21 DIAGNOSIS — M5430 Sciatica, unspecified side: Secondary | ICD-10-CM

## 2016-07-21 LAB — CBC WITH DIFFERENTIAL/PLATELET
BASOS ABS: 0.1 10*3/uL (ref 0–0.1)
BASOS PCT: 1 %
EOS ABS: 0.1 10*3/uL (ref 0–0.7)
EOS PCT: 1 %
HCT: 41.5 % (ref 40.0–52.0)
HEMOGLOBIN: 14 g/dL (ref 13.0–18.0)
Lymphocytes Relative: 25 %
Lymphs Abs: 1.3 10*3/uL (ref 1.0–3.6)
MCH: 29.7 pg (ref 26.0–34.0)
MCHC: 33.8 g/dL (ref 32.0–36.0)
MCV: 87.9 fL (ref 80.0–100.0)
Monocytes Absolute: 0.8 10*3/uL (ref 0.2–1.0)
Monocytes Relative: 14 %
NEUTROS PCT: 59 %
Neutro Abs: 3 10*3/uL (ref 1.4–6.5)
PLATELETS: 100 10*3/uL — AB (ref 150–440)
RBC: 4.72 MIL/uL (ref 4.40–5.90)
RDW: 24.7 % — ABNORMAL HIGH (ref 11.5–14.5)
WBC: 5.2 10*3/uL (ref 3.8–10.6)

## 2016-07-21 LAB — COMPREHENSIVE METABOLIC PANEL
ALBUMIN: 3.7 g/dL (ref 3.5–5.0)
ALK PHOS: 92 U/L (ref 38–126)
ALT: 28 U/L (ref 17–63)
AST: 40 U/L (ref 15–41)
Anion gap: 6 (ref 5–15)
BUN: 9 mg/dL (ref 6–20)
CALCIUM: 8.9 mg/dL (ref 8.9–10.3)
CHLORIDE: 108 mmol/L (ref 101–111)
CO2: 23 mmol/L (ref 22–32)
CREATININE: 0.7 mg/dL (ref 0.61–1.24)
GFR calc non Af Amer: >60 mL/min (ref >60)
GLUCOSE: 143 mg/dL — AB (ref 65–99)
Potassium: 4 mmol/L (ref 3.5–5.1)
SODIUM: 137 mmol/L (ref 135–145)
Total Bilirubin: 0.9 mg/dL (ref 0.3–1.2)
Total Protein: 7.2 g/dL (ref 6.5–8.1)

## 2016-07-21 MED ORDER — SODIUM CHLORIDE 0.9 % IV SOLN
2400.0000 mg/m2 | INTRAVENOUS | Status: DC
  Administered 2016-07-21: 4950 mg via INTRAVENOUS
  Filled 2016-07-21: qty 99

## 2016-07-21 MED ORDER — LEUCOVORIN CALCIUM INJECTION 350 MG
800.0000 mg | Freq: Once | INTRAVENOUS | Status: AC
  Administered 2016-07-21: 800 mg via INTRAVENOUS
  Filled 2016-07-21: qty 40

## 2016-07-21 MED ORDER — DEXTROSE 5 % IV SOLN
Freq: Once | INTRAVENOUS | Status: AC
  Administered 2016-07-21: 10:00:00 via INTRAVENOUS
  Filled 2016-07-21: qty 1000

## 2016-07-21 MED ORDER — OXALIPLATIN CHEMO INJECTION 100 MG/20ML
85.0000 mg/m2 | Freq: Once | INTRAVENOUS | Status: AC
  Administered 2016-07-21: 175 mg via INTRAVENOUS
  Filled 2016-07-21: qty 35

## 2016-07-21 MED ORDER — PALONOSETRON HCL INJECTION 0.25 MG/5ML
0.2500 mg | Freq: Once | INTRAVENOUS | Status: AC
  Administered 2016-07-21: 0.25 mg via INTRAVENOUS
  Filled 2016-07-21: qty 5

## 2016-07-21 MED ORDER — SODIUM CHLORIDE 0.9% FLUSH
10.0000 mL | INTRAVENOUS | Status: DC | PRN
  Administered 2016-07-21: 10 mL
  Filled 2016-07-21: qty 10

## 2016-07-21 MED ORDER — DEXAMETHASONE SODIUM PHOSPHATE 10 MG/ML IJ SOLN
10.0000 mg | Freq: Once | INTRAMUSCULAR | Status: AC
  Administered 2016-07-21: 10 mg via INTRAVENOUS
  Filled 2016-07-21: qty 1

## 2016-07-21 MED ORDER — FLUOROURACIL CHEMO INJECTION 2.5 GM/50ML
400.0000 mg/m2 | Freq: Once | INTRAVENOUS | Status: AC
  Administered 2016-07-21: 800 mg via INTRAVENOUS
  Filled 2016-07-21: qty 16

## 2016-07-21 MED ORDER — SODIUM CHLORIDE 0.9 % IV SOLN: 10.0000 mg | Freq: Once | INTRAVENOUS | Status: DC

## 2016-07-21 NOTE — Assessment & Plan Note (Addendum)
#   Ascending colon cancer; stage III on adjuvant chemotherapy with FOLFOX every 2 weeks 12. Given the positive margin- at surgery question need for radiation.  Evaluated by Dr.Crystal- plan adjuvant RT given T4 lesions post chemotherapy. Reviewed radiation oncology reocmmendations.   # Proceed with FOLFOX chemotherapy cycle #9   today. Patient tolerating chemotherapy well. Labs are adequate except platelets- 100  # Cold sensitivity/oxaliplatin grade 1- not an worse. Discussed re: precautions with cold weather.   # Follow-up with me in 2 weeks with chemotherapy/labs.

## 2016-07-21 NOTE — Progress Notes (Signed)
[place] NOTE  Patient Care Team: Marden Noble, MD as PCP - General (Internal Medicine) Marden Noble, MD (Internal Medicine) Christene Lye, MD (General Surgery)  CHIEF COMPLAINTS/PURPOSE OF CONSULTATION:  Oncology History   # June-July 2017-Ascending COLON CA STAGE III [pT3pN2 (4/14LN positive); POSITIVE RADIAL MARGIN; Dr.Sankar]; pre-op CEA- 1.5/N; MRI- liver-NEG; 03/26/2016- PET-NED   # June 24th 2017-  FOLFOX q 2 W  # IDA- [aug 2017] s/p IV venofer x4.    # MOLECULAR TESTING: MSI-STABLE; F-ONE-[july 2017] B- RAF V 600 E; Wild type Kras/N-ras ; others**     Cancer of ascending colon ([place])   02/26/2016 Initial Diagnosis    Cancer of ascending colon ([facility])        HISTORY OF PRESENTING ILLNESS:  Philip Shah 60 y.o.  male above history of stage III colon cancer is Currently on adjuvant FOLFOX.  In the interim patient has met with radiation oncology- interested in radiation given the T4 tumor.  Denies any diarrhea. Does complain of cold sensitivity. Otherwise denies any chronic tingling and numbness. No bleeding. No nausea vomiting.    ROS: A complete 10 point review of system is done which is negative except mentioned above in history of present illness.   MEDICAL HISTORY:  Past Medical History:  Diagnosis Date  . Abnormal colonoscopy 02-2016  . Anemia   . Enlarged prostate   . GERD (gastroesophageal reflux disease)   . H/O vasectomy 71  . Hemorrhoids   . Hx of ligation of vein 1981  . Sciatica   . Status post colon resection 02-26-16    SURGICAL HISTORY: Past Surgical History:  Procedure Laterality Date  . APPENDECTOMY    . COLONOSCOPY WITH PROPOFOL N/A 02/11/2016   Procedure: COLONOSCOPY WITH PROPOFOL;  Surgeon: Christene Lye, MD;  Location: [facility];  Service: Endoscopy;  Laterality: N/A;  . ESOPHAGOGASTRODUODENOSCOPY (EGD) WITH PROPOFOL N/A 02/11/2016   Procedure: ESOPHAGOGASTRODUODENOSCOPY (EGD) WITH  PROPOFOL;  Surgeon: Christene Lye, MD;  Location: [facility];  Service: Endoscopy;  Laterality: N/A;  . LAPAROSCOPIC RIGHT COLECTOMY Right 02/26/2016   Procedure: LAPAROSCOPIC RIGHT COLECTOMY;  Surgeon: Christene Lye, MD;  Location: [facility] ORS;  Service: General;  Laterality: Right;  . PORTACATH PLACEMENT N/A 03/18/2016   Procedure: INSERTION PORT-A-CATH;  Surgeon: Christene Lye, MD;  Location: [facility] ORS;  Service: General;  Laterality: N/A;  . VASECTOMY    . 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 Marital status: Single    Spouse name: N/A  . Number of children: N/A  . Years of education: N/A   Occupational History  . Not on file.   Social History Main Topics  . Smoking status: Never Smoker  . Smokeless tobacco: Never Used  . Alcohol use No     Comment: 3/day  . Drug use: No  . Sexual activity: Not on file   Other Topics Concern  . Not on file   Social History Narrative  . No narrative on file    FAMILY HISTORY: mat aunt- anal cancer; oldest brother/pre-cancerous polyps- [Dr. In [place]] Family History  Problem Relation Age of Onset  . Kidney failure Mother   . Cancer Father     spine    ALLERGIES:  is allergic to penicillins.  MEDICATIONS:  Current Outpatient Prescriptions  Medication  Sig Dispense Refill  . ferrous sulfate (FERROUSUL) 325 (65 FE) MG tablet Take 1 tablet (325 mg total) by mouth daily with breakfast. 30 tablet 3  . lidocaine-prilocaine (EMLA) cream Apply 1 application topically as needed. Apply generously over the Mediport 45 minutes prior to chemotherapy. 30 g 0  . Multiple Vitamin (MULTIVITAMIN WITH MINERALS) TABS tablet Take 1 tablet by mouth daily.    . ondansetron (ZOFRAN) 8 MG tablet Take 1 tablet (8 mg total) by mouth every 8 (eight) hours as needed for nausea or vomiting  (start 3 days; after chemo). (Patient not taking: Reported on 07/21/2016) 40 tablet 1  . prochlorperazine (COMPAZINE) 10 MG tablet Take 1 tablet (10 mg total) by mouth every 6 (six) hours as needed for nausea or vomiting. (Patient not taking: Reported on 07/21/2016) 40 tablet 1   No current facility-administered medications for this visit.    Facility-Administered Medications Ordered in Other Visits  Medication Dose Route Frequency Provider Last Rate Last Dose  . fluorouracil (ADRUCIL) 4,950 mg in sodium chloride 0.9 % 51 mL chemo infusion  2,400 mg/m2 (Treatment Plan Recorded) Intravenous 1 day or 1 dose Cammie Sickle, MD   4,950 mg at 07/21/16 1238  . Influenza vac split quadrivalent PF (FLUARIX) injection 0.5 mL  0.5 mL Intramuscular Once Cammie Sickle, MD      . sodium chloride flush (NS) 0.9 % injection 10 mL  10 mL Intracatheter PRN Cammie Sickle, MD   10 mL at 07/21/16 0900      .  PHYSICAL EXAMINATION: ECOG PERFORMANCE STATUS: 0 - Asymptomatic  Vitals:   07/21/16 0907  BP: 113/77  Pulse: 84  Resp: 20  Temp: 97.3 F (36.3 C)   Filed Weights   07/21/16 0907  Weight: 188 lb (85.3 kg)    GENERAL: Well-nourished well-developed; Alert, no distress and comfortable. He is alone. EYES: no pallor or icterus OROPHARYNX: no thrush or ulceration; good dentition  NECK: supple, no masses felt LYMPH:  no palpable lymphadenopathy in the cervical, axillary or inguinal regions LUNGS: clear to auscultation and  No wheeze or crackles HEART/CVS: regular rate & rhythm and no murmurs; No lower extremity edema ABDOMEN: abdomen soft, non-tender and normal bowel sounds.  Musculoskeletal:no cyanosis of digits and no clubbing  PSYCH: alert & oriented x 3 with fluent speech NEURO: no focal motor/sensory deficits SKIN:  no rashes or significant lesions  LABORATORY DATA:  I have reviewed the data as listed Lab Results  Component Value Date   WBC 5.2 07/21/2016   HGB 14.0  07/21/2016   HCT 41.5 07/21/2016   MCV 87.9 07/21/2016   PLT 100 (L) 07/21/2016    Recent Labs  06/23/16 0824 07/07/16 0820 07/21/16 0840  NA 137 136 137  K 3.8 3.8 4.0  CL 106 106 108  CO2 _0 GLUCOSE 178* 146* 143*  BUN _1 CREATININE 0.72 0.88 0.70  CALCIUM 8.8* 8.7* 8.9  GFRNONAA >60 >60 >60  GFRAA >60 >60 >60  PROT 7.1 7.1 7.2  ALBUMIN 3.5 3.7 3.7  AST 36 39 40  ALT _2 ALKPHOS 80 90 92  BILITOT 0.7 0.7 0.9    RADIOGRAPHIC STUDIES: I have personally reviewed the radiological images as listed and agreed with the findings in the report. No results found.  ASSESSMENT & PLAN:   Cancer of ascending colon ([place]) # Ascending colon cancer; stage III on adjuvant chemotherapy with FOLFOX every 2 weeks 12.  Given the positive margin- at surgery question need for radiation.  Evaluated by Dr.Crystal- plan adjuvant RT given T4 lesions post chemotherapy. Reviewed radiation oncology reocmmendations.   # Proceed with FOLFOX chemotherapy cycle #9   today. Patient tolerating chemotherapy well. Labs are adequate except platelets- 100  # Cold sensitivity/oxaliplatin grade 1- not an worse. Discussed re: precautions with cold weather.   # Follow-up with me in 2 weeks with chemotherapy/labs.      Cammie Sickle, MD 07/21/2016 4:05 PM [place] NOTE  Patient Care Team: Marden Noble, MD as PCP - General (Internal Medicine) Marden Noble, MD (Internal Medicine) Christene Lye, MD (General Surgery)  CHIEF COMPLAINTS/PURPOSE OF CONSULTATION:  Oncology History   # June-July 2017-Ascending COLON CA STAGE III [pT3pN2 (4/14LN positive); POSITIVE RADIAL MARGIN; Dr.Sankar]; pre-op CEA- 1.5/N; MRI- liver-NEG; 03/26/2016- PET-NED   # June 24th 2017-  FOLFOX q 2 W  # IDA- [aug 2017] s/p IV venofer x4.    # MOLECULAR TESTING: MSI-STABLE; F-ONE-[july 2017] B- RAF V 600 E; Wild type Kras/N-ras ; others**     Cancer of ascending  colon ([place])   02/26/2016 Initial Diagnosis    Cancer of ascending colon ([facility])        HISTORY OF PRESENTING ILLNESS:  Philip Shah 60 y.o.  male above history of stage III colon cancer is Currently on adjuvant FOLFOX.  Mild intermittent diarrhea. Mild cold sensitivity. Otherwise her tingling and numbness. No bleeding. No nausea vomiting.    ROS: A complete 10 point review of system is done which is negative except mentioned above in history of present illness.   MEDICAL HISTORY:  Past Medical History:  Diagnosis Date  . Abnormal colonoscopy 02-2016  . Anemia   . Enlarged prostate   . GERD (gastroesophageal reflux disease)   . H/O vasectomy 24  . Hemorrhoids   . Hx of ligation of vein 1981  . Sciatica   . Status post colon resection 02-26-16    SURGICAL HISTORY: Past Surgical History:  Procedure Laterality Date  . APPENDECTOMY    . COLONOSCOPY WITH PROPOFOL N/A 02/11/2016   Procedure: COLONOSCOPY WITH PROPOFOL;  Surgeon: Christene Lye, MD;  Location: [facility];  Service: Endoscopy;  Laterality: N/A;  . ESOPHAGOGASTRODUODENOSCOPY (EGD) WITH PROPOFOL N/A 02/11/2016   Procedure: ESOPHAGOGASTRODUODENOSCOPY (EGD) WITH PROPOFOL;  Surgeon: Christene Lye, MD;  Location: [facility];  Service: Endoscopy;  Laterality: N/A;  . LAPAROSCOPIC RIGHT COLECTOMY Right 02/26/2016   Procedure: LAPAROSCOPIC RIGHT COLECTOMY;  Surgeon: Christene Lye, MD;  Location: [facility];  Service: General;  Laterality: Right;  . PORTACATH PLACEMENT N/A 03/18/2016   Procedure: INSERTION PORT-A-CATH;  Surgeon: Christene Lye, MD;  Location: [facility];  Service: General;  Laterality: N/A;  . VASECTOMY    . 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 Marital status: Single    Spouse name: N/A  . Number of children: N/A  . Years of  education: N/A   Occupational History  . Not on file.   Social History Main Topics  . Smoking status: Never Smoker  . Smokeless tobacco: Never Used  . Alcohol use No     Comment: 3/day  . Drug use: No  . Sexual activity: Not on file   Other Topics Concern  . Not on file  Social History Narrative  . No narrative on file    FAMILY HISTORY: mat aunt- anal cancer; oldest brother/pre-cancerous polyps- [Dr. In [place]] Family History  Problem Relation Age of Onset  . Kidney failure Mother   . Cancer Father     spine    ALLERGIES:  is allergic to penicillins.  MEDICATIONS:  Current Outpatient Prescriptions  Medication Sig Dispense Refill  . ferrous sulfate (FERROUSUL) 325 (65 FE) MG tablet Take 1 tablet (325 mg total) by mouth daily with breakfast. 30 tablet 3  . lidocaine-prilocaine (EMLA) cream Apply 1 application topically as needed. Apply generously over the Mediport 45 minutes prior to chemotherapy. 30 g 0  . Multiple Vitamin (MULTIVITAMIN WITH MINERALS) TABS tablet Take 1 tablet by mouth daily.    . ondansetron (ZOFRAN) 8 MG tablet Take 1 tablet (8 mg total) by mouth every 8 (eight) hours as needed for nausea or vomiting (start 3 days; after chemo). (Patient not taking: Reported on 07/21/2016) 40 tablet 1  . prochlorperazine (COMPAZINE) 10 MG tablet Take 1 tablet (10 mg total) by mouth every 6 (six) hours as needed for nausea or vomiting. (Patient not taking: Reported on 07/21/2016) 40 tablet 1   No current facility-administered medications for this visit.    Facility-Administered Medications Ordered in Other Visits  Medication Dose Route Frequency Provider Last Rate Last Dose  . fluorouracil (ADRUCIL) 4,950 mg in sodium chloride 0.9 % 51 mL chemo infusion  2,400 mg/m2 (Treatment Plan Recorded) Intravenous 1 day or 1 dose Cammie Sickle, MD   4,950 mg at 07/21/16 1238  . Influenza vac split quadrivalent PF (FLUARIX) injection 0.5 mL  0.5 mL Intramuscular Once Cammie Sickle, MD      . sodium chloride flush (NS) 0.9 % injection 10 mL  10 mL Intracatheter PRN Cammie Sickle, MD   10 mL at 07/21/16 0900      .  PHYSICAL EXAMINATION: ECOG PERFORMANCE STATUS: 0 - Asymptomatic  Vitals:   07/21/16 0907  BP: 113/77  Pulse: 84  Resp: 20  Temp: 97.3 F (36.3 C)   Filed Weights   07/21/16 0907  Weight: 188 lb (85.3 kg)    GENERAL: Well-nourished well-developed; Alert, no distress and comfortable. He is alone. EYES: no pallor or icterus OROPHARYNX: no thrush or ulceration; good dentition  NECK: supple, no masses felt LYMPH:  no palpable lymphadenopathy in the cervical, axillary or inguinal regions LUNGS: clear to auscultation and  No wheeze or crackles HEART/CVS: regular rate & rhythm and no murmurs; No lower extremity edema ABDOMEN: abdomen soft, non-tender and normal bowel sounds.  Musculoskeletal:no cyanosis of digits and no clubbing  PSYCH: alert & oriented x 3 with fluent speech NEURO: no focal motor/sensory deficits SKIN:  no rashes or significant lesions  LABORATORY DATA:  I have reviewed the data as listed Lab Results  Component Value Date   WBC 5.2 07/21/2016   HGB 14.0 07/21/2016   HCT 41.5 07/21/2016   MCV 87.9 07/21/2016   PLT 100 (L) 07/21/2016    Recent Labs  06/23/16 0824 07/07/16 0820 07/21/16 0840  NA 137 136 137  K 3.8 3.8 4.0  CL 106 106 108  CO2 _0 GLUCOSE 178* 146* 143*  BUN _1 CREATININE 0.72 0.88 0.70  CALCIUM 8.8* 8.7* 8.9  GFRNONAA >60 >60 >60  GFRAA >60 >60 >60  PROT 7.1 7.1 7.2  ALBUMIN 3.5 3.7 3.7  AST 36 39 40  ALT 23  27 28  ALKPHOS 80 90 92  BILITOT 0.7 0.7 0.9    RADIOGRAPHIC STUDIES: I have personally reviewed the radiological images as listed and agreed with the findings in the report. No results found.  ASSESSMENT & PLAN:   Cancer of ascending colon ([place]) # Ascending colon cancer; stage III on adjuvant chemotherapy with FOLFOX every 2 weeks 12. Given the  positive margin- at surgery question need for radiation.  Evaluated by Dr.Crystal- plan adjuvant RT given T4 lesions post chemotherapy. Reviewed radiation oncology reocmmendations.   # Proceed with FOLFOX chemotherapy cycle #9   today. Patient tolerating chemotherapy well. Labs are adequate except platelets- 100  # Cold sensitivity/oxaliplatin grade 1- not an worse. Discussed re: precautions with cold weather.   # Follow-up with me in 2 weeks with chemotherapy/labs.      Cammie Sickle, MD 07/21/2016 4:05 PM

## 2016-07-21 NOTE — Progress Notes (Signed)
Patient has elected to proceed with radiation therapy.

## 2016-07-21 NOTE — Progress Notes (Signed)
MD aware of plt count =100.  Proceed with treatment

## 2016-07-23 ENCOUNTER — Inpatient Hospital Stay: Payer: BLUE CROSS/BLUE SHIELD

## 2016-07-23 VITALS — BP 124/76 | HR 79 | Temp 97.6°F | Resp 20

## 2016-07-23 DIAGNOSIS — C182 Malignant neoplasm of ascending colon: Secondary | ICD-10-CM

## 2016-07-23 MED ORDER — HEPARIN SOD (PORK) LOCK FLUSH 100 UNIT/ML IV SOLN
INTRAVENOUS | Status: AC
  Filled 2016-07-23: qty 5

## 2016-07-23 MED ORDER — HEPARIN SOD (PORK) LOCK FLUSH 100 UNIT/ML IV SOLN
500.0000 [IU] | Freq: Once | INTRAVENOUS | Status: AC | PRN
  Administered 2016-07-23: 500 [IU]

## 2016-07-23 MED ORDER — SODIUM CHLORIDE 0.9% FLUSH
10.0000 mL | INTRAVENOUS | Status: DC | PRN
  Administered 2016-07-23: 10 mL
  Filled 2016-07-23: qty 10

## 2016-08-04 ENCOUNTER — Inpatient Hospital Stay (HOSPITAL_BASED_OUTPATIENT_CLINIC_OR_DEPARTMENT_OTHER): Payer: BLUE CROSS/BLUE SHIELD | Admitting: Internal Medicine

## 2016-08-04 ENCOUNTER — Inpatient Hospital Stay: Payer: BLUE CROSS/BLUE SHIELD

## 2016-08-04 VITALS — BP 125/74 | HR 77 | Temp 97.8°F | Resp 18 | Wt 185.4 lb

## 2016-08-04 DIAGNOSIS — G629 Polyneuropathy, unspecified: Secondary | ICD-10-CM

## 2016-08-04 DIAGNOSIS — R200 Anesthesia of skin: Secondary | ICD-10-CM

## 2016-08-04 DIAGNOSIS — D509 Iron deficiency anemia, unspecified: Secondary | ICD-10-CM

## 2016-08-04 DIAGNOSIS — Z9049 Acquired absence of other specified parts of digestive tract: Secondary | ICD-10-CM

## 2016-08-04 DIAGNOSIS — M5430 Sciatica, unspecified side: Secondary | ICD-10-CM

## 2016-08-04 DIAGNOSIS — N400 Enlarged prostate without lower urinary tract symptoms: Secondary | ICD-10-CM

## 2016-08-04 DIAGNOSIS — R202 Paresthesia of skin: Secondary | ICD-10-CM

## 2016-08-04 DIAGNOSIS — K219 Gastro-esophageal reflux disease without esophagitis: Secondary | ICD-10-CM

## 2016-08-04 DIAGNOSIS — C182 Malignant neoplasm of ascending colon: Secondary | ICD-10-CM

## 2016-08-04 DIAGNOSIS — R197 Diarrhea, unspecified: Secondary | ICD-10-CM

## 2016-08-04 DIAGNOSIS — Z79899 Other long term (current) drug therapy: Secondary | ICD-10-CM

## 2016-08-04 LAB — CBC WITH DIFFERENTIAL/PLATELET
BASOS ABS: 0 10*3/uL (ref 0–0.1)
BASOS PCT: 1 %
Eosinophils Absolute: 0.1 10*3/uL (ref 0–0.7)
Eosinophils Relative: 1 %
HEMATOCRIT: 39.7 % — AB (ref 40.0–52.0)
HEMOGLOBIN: 13.5 g/dL (ref 13.0–18.0)
LYMPHS PCT: 27 %
Lymphs Abs: 1.2 10*3/uL (ref 1.0–3.6)
MCH: 31 pg (ref 26.0–34.0)
MCHC: 34 g/dL (ref 32.0–36.0)
MCV: 91.1 fL (ref 80.0–100.0)
MONO ABS: 0.6 10*3/uL (ref 0.2–1.0)
MONOS PCT: 13 %
NEUTROS ABS: 2.5 10*3/uL (ref 1.4–6.5)
NEUTROS PCT: 58 %
Platelets: 84 10*3/uL — ABNORMAL LOW (ref 150–440)
RBC: 4.35 MIL/uL — ABNORMAL LOW (ref 4.40–5.90)
RDW: 21.6 % — AB (ref 11.5–14.5)
WBC: 4.4 10*3/uL (ref 3.8–10.6)

## 2016-08-04 LAB — COMPREHENSIVE METABOLIC PANEL
ALBUMIN: 3.5 g/dL (ref 3.5–5.0)
ALK PHOS: 83 U/L (ref 38–126)
ALT: 25 U/L (ref 17–63)
AST: 40 U/L (ref 15–41)
Anion gap: 6 (ref 5–15)
BILIRUBIN TOTAL: 0.9 mg/dL (ref 0.3–1.2)
BUN: 13 mg/dL (ref 6–20)
CALCIUM: 8.7 mg/dL — AB (ref 8.9–10.3)
CO2: 24 mmol/L (ref 22–32)
Chloride: 108 mmol/L (ref 101–111)
Creatinine, Ser: 0.7 mg/dL (ref 0.61–1.24)
GFR calc Af Amer: >60 mL/min (ref >60)
GLUCOSE: 158 mg/dL — AB (ref 65–99)
POTASSIUM: 3.9 mmol/L (ref 3.5–5.1)
Sodium: 138 mmol/L (ref 135–145)
TOTAL PROTEIN: 6.8 g/dL (ref 6.5–8.1)

## 2016-08-04 MED ORDER — HEPARIN SOD (PORK) LOCK FLUSH 100 UNIT/ML IV SOLN: 500.0000 [IU] | Freq: Once | INTRAVENOUS | Status: AC

## 2016-08-04 MED ORDER — SODIUM CHLORIDE 0.9 % IV SOLN
2400.0000 mg/m2 | INTRAVENOUS | Status: DC
  Administered 2016-08-04: 4950 mg via INTRAVENOUS
  Filled 2016-08-04: qty 99

## 2016-08-04 MED ORDER — LEUCOVORIN CALCIUM INJECTION 350 MG
800.0000 mg | Freq: Once | INTRAVENOUS | Status: AC
  Administered 2016-08-04: 800 mg via INTRAVENOUS
  Filled 2016-08-04: qty 40

## 2016-08-04 MED ORDER — PALONOSETRON HCL INJECTION 0.25 MG/5ML
0.2500 mg | Freq: Once | INTRAVENOUS | Status: AC
  Administered 2016-08-04: 0.25 mg via INTRAVENOUS
  Filled 2016-08-04: qty 5

## 2016-08-04 MED ORDER — DEXTROSE 5 % IV SOLN
Freq: Once | INTRAVENOUS | Status: AC
  Administered 2016-08-04: 09:00:00 via INTRAVENOUS
  Filled 2016-08-04: qty 1000

## 2016-08-04 MED ORDER — SODIUM CHLORIDE 0.9% FLUSH
10.0000 mL | Freq: Once | INTRAVENOUS | Status: AC
  Administered 2016-08-04: 10 mL via INTRAVENOUS
  Filled 2016-08-04: qty 10

## 2016-08-04 MED ORDER — OXALIPLATIN CHEMO INJECTION 100 MG/20ML
85.0000 mg/m2 | Freq: Once | INTRAVENOUS | Status: AC
  Administered 2016-08-04: 175 mg via INTRAVENOUS
  Filled 2016-08-04: qty 35

## 2016-08-04 MED ORDER — DEXAMETHASONE SODIUM PHOSPHATE 10 MG/ML IJ SOLN
10.0000 mg | Freq: Once | INTRAMUSCULAR | Status: AC
Start: 2016-08-04 — End: 2016-08-04
  Administered 2016-08-04: 10 mg via INTRAVENOUS
  Filled 2016-08-04: qty 1

## 2016-08-04 MED ORDER — FLUOROURACIL CHEMO INJECTION 2.5 GM/50ML
400.0000 mg/m2 | Freq: Once | INTRAVENOUS | Status: AC
  Administered 2016-08-04: 800 mg via INTRAVENOUS
  Filled 2016-08-04: qty 16

## 2016-08-04 NOTE — Progress Notes (Signed)
PLT =84, MD aware and to proceed with treatment

## 2016-08-04 NOTE — Progress Notes (Signed)
Patient is here for follow up he is doing well. He does mention some numbness in his fingers

## 2016-08-04 NOTE — Assessment & Plan Note (Signed)
#   Ascending colon cancer; stage III on adjuvant chemotherapy with FOLFOX every 2 weeks 12. Given the positive margin- at surgery question need for radiation.  Await RT post chemo.   # Proceed with FOLFOX chemotherapy cycle #10  today. Patient tolerating chemotherapy well. Labs are adequate except platelets- 84.   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  # Follow-up with me in 2 weeks with chemotherapy/labs.

## 2016-08-04 NOTE — Progress Notes (Signed)
[place] NOTE  Patient Care Team: Marden Noble, MD as PCP - General (Internal Medicine) Marden Noble, MD (Internal Medicine) Christene Lye, MD (General Surgery)  CHIEF COMPLAINTS/PURPOSE OF CONSULTATION:  Oncology History   # June-July 2017-Ascending COLON CA STAGE III [pT3pN2 (4/14LN positive); POSITIVE RADIAL MARGIN; Dr.Sankar]; pre-op CEA- 1.5/N; MRI- liver-NEG; 03/26/2016- PET-NED   # June 24th 2017-  FOLFOX q 2 W  # IDA- [aug 2017] s/p IV venofer x4.    # MOLECULAR TESTING: MSI-STABLE; F-ONE-[july 2017] B- RAF V 600 E; Wild type Kras/N-ras ; others**     Cancer of ascending colon ([place])   02/26/2016 Initial Diagnosis    Cancer of ascending colon ([facility])        HISTORY OF PRESENTING ILLNESS:  Philip Shah 60 y.o.  male above history of stage III colon cancer is Currently on adjuvant FOLFOX Status post 9 treatments so far.  Denies any diarrhea. Does complain of cold sensitivity. Patient admits to mild tingling and numbness of his fingertips; none in his legs. Denies abdominal pain. No nausea no vomiting. No bleeding. No nausea vomiting.    ROS: A complete 10 point review of system is done which is negative except mentioned above in history of present illness.   MEDICAL HISTORY:  Past Medical History:  Diagnosis Date  . Abnormal colonoscopy 02-2016  . Anemia   . Enlarged prostate   . GERD (gastroesophageal reflux disease)   . H/O vasectomy 59  . Hemorrhoids   . Hx of ligation of vein 1981  . Sciatica   . Status post colon resection 02-26-16    SURGICAL HISTORY: Past Surgical History:  Procedure Laterality Date  . APPENDECTOMY    . COLONOSCOPY WITH PROPOFOL N/A 02/11/2016   Procedure: COLONOSCOPY WITH PROPOFOL;  Surgeon: Christene Lye, MD;  Location: [facility];  Service: Endoscopy;  Laterality: N/A;  . ESOPHAGOGASTRODUODENOSCOPY (EGD) WITH PROPOFOL N/A 02/11/2016   Procedure: ESOPHAGOGASTRODUODENOSCOPY (EGD) WITH  PROPOFOL;  Surgeon: Christene Lye, MD;  Location: [facility];  Service: Endoscopy;  Laterality: N/A;  . LAPAROSCOPIC RIGHT COLECTOMY Right 02/26/2016   Procedure: LAPAROSCOPIC RIGHT COLECTOMY;  Surgeon: Christene Lye, MD;  Location: [facility];  Service: General;  Laterality: Right;  . PORTACATH PLACEMENT N/A 03/18/2016   Procedure: INSERTION PORT-A-CATH;  Surgeon: Christene Lye, MD;  Location: [facility];  Service: General;  Laterality: N/A;  . VASECTOMY    . 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 Marital status: Single    Spouse name: N/A  . Number of children: N/A  . Years of education: N/A   Occupational History  . Not on file.   Social History Main Topics  . Smoking status: Never Smoker  . Smokeless tobacco: Never Used  . Alcohol use No     Comment: 3/day  . Drug use: No  . Sexual activity: Not on file   Other Topics Concern  . Not on file   Social History Narrative  . No narrative on file    FAMILY HISTORY: mat aunt- anal cancer; oldest brother/pre-cancerous polyps- [Dr. In [place]] Family History  Problem Relation Age of Onset  . Kidney failure Mother   . Cancer Father     spine    ALLERGIES:  is allergic to penicillins.  MEDICATIONS:  Current Outpatient  Prescriptions  Medication Sig Dispense Refill  . ferrous sulfate (FERROUSUL) 325 (65 FE) MG tablet Take 1 tablet (325 mg total) by mouth daily with breakfast. 30 tablet 3  . lidocaine-prilocaine (EMLA) cream Apply 1 application topically as needed. Apply generously over the Mediport 45 minutes prior to chemotherapy. 30 g 0  . Multiple Vitamin (MULTIVITAMIN WITH MINERALS) TABS tablet Take 1 tablet by mouth daily.    . ondansetron (ZOFRAN) 8 MG tablet Take 1 tablet (8 mg total) by mouth every 8 (eight) hours as needed for nausea or vomiting  (start 3 days; after chemo). (Patient not taking: Reported on 08/04/2016) 40 tablet 1  . prochlorperazine (COMPAZINE) 10 MG tablet Take 1 tablet (10 mg total) by mouth every 6 (six) hours as needed for nausea or vomiting. (Patient not taking: Reported on 08/04/2016) 40 tablet 1   No current facility-administered medications for this visit.    Facility-Administered Medications Ordered in Other Visits  Medication Dose Route Frequency Provider Last Rate Last Dose  . fluorouracil (ADRUCIL) 4,950 mg in sodium chloride 0.9 % 51 mL chemo infusion  2,400 mg/m2 (Treatment Plan Recorded) Intravenous 1 day or 1 dose Cammie Sickle, MD   4,950 mg at 08/04/16 1310  . heparin lock flush 100 unit/mL  500 Units Intravenous Once Cammie Sickle, MD      . Influenza vac split quadrivalent PF (FLUARIX) injection 0.5 mL  0.5 mL Intramuscular Once Cammie Sickle, MD          .  PHYSICAL EXAMINATION: ECOG PERFORMANCE STATUS: 0 - Asymptomatic  Vitals:   08/04/16 0842  BP: 125/74  Pulse: 77  Resp: 18  Temp: 97.8 F (36.6 C)   Filed Weights   08/04/16 0842  Weight: 185 lb 6.4 oz (84.1 kg)    GENERAL: Well-nourished well-developed; Alert, no distress and comfortable. He is alone. EYES: no pallor or icterus OROPHARYNX: no thrush or ulceration; good dentition  NECK: supple, no masses felt LYMPH:  no palpable lymphadenopathy in the cervical, axillary or inguinal regions LUNGS: clear to auscultation and  No wheeze or crackles HEART/CVS: regular rate & rhythm and no murmurs; No lower extremity edema ABDOMEN: abdomen soft, non-tender and normal bowel sounds.  Musculoskeletal:no cyanosis of digits and no clubbing  PSYCH: alert & oriented x 3 with fluent speech NEURO: no focal motor/sensory deficits SKIN:  no rashes or significant lesions  LABORATORY DATA:  I have reviewed the data as listed Lab Results  Component Value Date   WBC 4.4 08/04/2016   HGB 13.5 08/04/2016   HCT 39.7 (L)  08/04/2016   MCV 91.1 08/04/2016   PLT 84 (L) 08/04/2016    Recent Labs  07/07/16 0820 07/21/16 0840 08/04/16 0819  NA 136 137 138  K 3.8 4.0 3.9  CL 106 108 108  CO2 '25 23 24  ' GLUCOSE 146* 143* 158*  BUN '13 9 13  ' CREATININE 0.88 0.70 0.70  CALCIUM 8.7* 8.9 8.7*  GFRNONAA >60 >60 >60  GFRAA >60 >60 >60  PROT 7.1 7.2 6.8  ALBUMIN 3.7 3.7 3.5  AST 39 40 40  ALT '27 28 25  ' ALKPHOS 90 92 83  BILITOT 0.7 0.9 0.9    RADIOGRAPHIC STUDIES: I have personally reviewed the radiological images as listed and agreed with the findings in the report. No results found.  ASSESSMENT & PLAN:   Cancer of ascending colon ([place]) # Ascending colon cancer; stage III on adjuvant chemotherapy with FOLFOX every 2 weeks 12. Given  the positive margin- at surgery question need for radiation.  Await RT post chemo.   # Proceed with FOLFOX chemotherapy cycle #10  today. Patient tolerating chemotherapy well. Labs are adequate except platelets- 84.   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  # Follow-up with me in 2 weeks with chemotherapy/labs.      Cammie Sickle, MD 08/04/2016 1:48 PM

## 2016-08-06 ENCOUNTER — Inpatient Hospital Stay: Payer: BLUE CROSS/BLUE SHIELD | Attending: Internal Medicine

## 2016-08-06 VITALS — BP 123/72 | HR 88 | Temp 98.9°F | Resp 18

## 2016-08-06 DIAGNOSIS — G629 Polyneuropathy, unspecified: Secondary | ICD-10-CM | POA: Diagnosis not present

## 2016-08-06 DIAGNOSIS — N400 Enlarged prostate without lower urinary tract symptoms: Secondary | ICD-10-CM | POA: Insufficient documentation

## 2016-08-06 DIAGNOSIS — Z5111 Encounter for antineoplastic chemotherapy: Secondary | ICD-10-CM | POA: Diagnosis not present

## 2016-08-06 DIAGNOSIS — R200 Anesthesia of skin: Secondary | ICD-10-CM | POA: Diagnosis not present

## 2016-08-06 DIAGNOSIS — K219 Gastro-esophageal reflux disease without esophagitis: Secondary | ICD-10-CM | POA: Insufficient documentation

## 2016-08-06 DIAGNOSIS — Z79899 Other long term (current) drug therapy: Secondary | ICD-10-CM | POA: Insufficient documentation

## 2016-08-06 DIAGNOSIS — C182 Malignant neoplasm of ascending colon: Secondary | ICD-10-CM | POA: Diagnosis present

## 2016-08-06 DIAGNOSIS — R202 Paresthesia of skin: Secondary | ICD-10-CM | POA: Diagnosis not present

## 2016-08-06 MED ORDER — HEPARIN SOD (PORK) LOCK FLUSH 100 UNIT/ML IV SOLN
500.0000 [IU] | Freq: Once | INTRAVENOUS | Status: AC | PRN
  Administered 2016-08-06: 500 [IU]
  Filled 2016-08-06: qty 5

## 2016-08-06 MED ORDER — SODIUM CHLORIDE 0.9% FLUSH
10.0000 mL | INTRAVENOUS | Status: DC | PRN
  Administered 2016-08-06: 10 mL
  Filled 2016-08-06: qty 10

## 2016-08-16 ENCOUNTER — Encounter: Payer: Self-pay | Admitting: General Surgery

## 2016-08-16 ENCOUNTER — Ambulatory Visit (INDEPENDENT_AMBULATORY_CARE_PROVIDER_SITE_OTHER): Payer: BLUE CROSS/BLUE SHIELD | Admitting: General Surgery

## 2016-08-16 VITALS — BP 120/72 | HR 74 | Resp 12 | Ht 64.0 in | Wt 189.0 lb

## 2016-08-16 DIAGNOSIS — C182 Malignant neoplasm of ascending colon: Secondary | ICD-10-CM

## 2016-08-16 NOTE — Patient Instructions (Addendum)
Return in June 2018 in with a colonoscopy.

## 2016-08-16 NOTE — Progress Notes (Signed)
Patient ID: Philip Shah, male   DOB: 1955/10/08, 60 y.o.   MRN: [id number]  Chief Complaint  Patient presents with  . Follow-up    HPI Philip Shah is a 60 y.o. male here today for his follow up colon cancer. Patient states he is doing good. He has two chemo treatments left and starts radiation  in January 2018. Moves his bowel two times daily. Overall he he is doing well. I have reviewed the history of present illness with the patient.  HPI  Past Medical History:  Diagnosis Date  . Abnormal colonoscopy 02-2016  . Anemia   . Enlarged prostate   . GERD (gastroesophageal reflux disease)   . H/O vasectomy 74  . Hemorrhoids   . Hx of ligation of vein 1981  . Sciatica   . Status post colon resection 02-26-16    Past Surgical History:  Procedure Laterality Date  . APPENDECTOMY    . COLONOSCOPY WITH PROPOFOL N/A 02/11/2016   Procedure: COLONOSCOPY WITH PROPOFOL;  Surgeon: Christene Lye, MD;  Location: [facility];  Service: Endoscopy;  Laterality: N/A;  . ESOPHAGOGASTRODUODENOSCOPY (EGD) WITH PROPOFOL N/A 02/11/2016   Procedure: ESOPHAGOGASTRODUODENOSCOPY (EGD) WITH PROPOFOL;  Surgeon: Christene Lye, MD;  Location: [facility];  Service: Endoscopy;  Laterality: N/A;  . LAPAROSCOPIC RIGHT COLECTOMY Right 02/26/2016   Procedure: LAPAROSCOPIC RIGHT COLECTOMY;  Surgeon: Christene Lye, MD;  Location: [facility];  Service: General;  Laterality: Right;  . PORTACATH PLACEMENT N/A 03/18/2016   Procedure: INSERTION PORT-A-CATH;  Surgeon: Christene Lye, MD;  Location: [facility];  Service: General;  Laterality: N/A;  . VASECTOMY    . VEIN LIGATION Right 1980's   Dr Pat Kocher    Family History  Problem Relation Age of Onset  . Kidney failure Mother   . Cancer Father     spine    Social History Social History  Substance Use Topics  . Smoking status: Never Smoker  . Smokeless tobacco: Never Used  . Alcohol use No     Comment: 3/day     Allergies  Allergen Reactions  . Penicillins Rash    Rash as a child. Has patient had a PCN reaction causing immediate rash, facial/tongue/throat swelling, SOB or lightheadedness with hypotension: No Has patient had a PCN reaction causing severe rash involving mucus membranes or skin necrosis: No Has patient had a PCN reaction that required hospitalization No Has patient had a PCN reaction occurring within the last 10 years: No If all of the above answers are "NO", then may proceed with Cephalosporin use.     Current Outpatient Prescriptions  Medication Sig Dispense Refill  . ferrous sulfate (FERROUSUL) 325 (65 FE) MG tablet Take 1 tablet (325 mg total) by mouth daily with breakfast. 30 tablet 3  . lidocaine-prilocaine (EMLA) cream Apply 1 application topically as needed. Apply generously over the Mediport 45 minutes prior to chemotherapy. 30 Shah 0  . Multiple Vitamin (MULTIVITAMIN WITH MINERALS) TABS tablet Take 1 tablet by mouth daily.    . ondansetron (ZOFRAN) 8 MG tablet Take 1 tablet (8 mg total) by mouth every 8 (eight) hours as needed for nausea or vomiting (start 3 days; after chemo). (Patient not taking: Reported on 08/04/2016) 40 tablet 1   No current facility-administered medications for this visit.     Review of Systems Review of Systems  Constitutional: Negative.   Respiratory: Negative.   Cardiovascular: Negative.   Gastrointestinal: Negative.     Blood pressure 120/72, pulse  74, resp. rate 12, height 5\' 4"  (1.626 m), weight 189 lb (85.7 kg).  Physical Exam Physical Exam  Constitutional: He is oriented to person, place, and time. He appears well-developed and well-nourished.  Eyes: Conjunctivae are normal. No scleral icterus.  Neck: Neck supple.  Cardiovascular: Normal rate, regular rhythm and normal heart sounds.   Pulmonary/Chest: Effort normal and breath sounds normal.    Abdominal: Soft. Normal appearance and bowel sounds are normal. There is no  hepatomegaly. There is no tenderness. No hernia.    Lymphadenopathy:    He has no cervical adenopathy.  Neurological: He is alert and oriented to person, place, and time.  Skin: Skin is warm and dry.    Data Reviewed  Prior notes, op notes, cancer center notes and labs Assessment    Stage IIIB   right colon cancer s/p right hemicolectomy 6 mos ago. Chemo ongoing, to be followed by radiation   Exam is stable Plan     Return in June 2018 . Colonoscopy after that visit  This information has been scribed by Gaspar Cola CMA.   SANKAR,SEEPLAPUTHUR Shah 08/16/2016, 9:35 AM

## 2016-08-18 ENCOUNTER — Ambulatory Visit
Admission: RE | Admit: 2016-08-18 | Discharge: 2016-08-18 | Disposition: A | Discharge status: Home / Self Care | Payer: BLUE CROSS/BLUE SHIELD | Source: Ambulatory Visit | Attending: Radiation Oncology | Admitting: Radiation Oncology

## 2016-08-18 ENCOUNTER — Inpatient Hospital Stay: Payer: BLUE CROSS/BLUE SHIELD

## 2016-08-18 ENCOUNTER — Inpatient Hospital Stay (HOSPITAL_BASED_OUTPATIENT_CLINIC_OR_DEPARTMENT_OTHER): Payer: BLUE CROSS/BLUE SHIELD | Admitting: Internal Medicine

## 2016-08-18 VITALS — BP 126/80 | HR 83 | Temp 98.0°F | Resp 18 | Wt 183.2 lb

## 2016-08-18 DIAGNOSIS — R200 Anesthesia of skin: Secondary | ICD-10-CM

## 2016-08-18 DIAGNOSIS — Z79899 Other long term (current) drug therapy: Secondary | ICD-10-CM

## 2016-08-18 DIAGNOSIS — G629 Polyneuropathy, unspecified: Secondary | ICD-10-CM | POA: Diagnosis not present

## 2016-08-18 DIAGNOSIS — N400 Enlarged prostate without lower urinary tract symptoms: Secondary | ICD-10-CM

## 2016-08-18 DIAGNOSIS — C182 Malignant neoplasm of ascending colon: Secondary | ICD-10-CM

## 2016-08-18 DIAGNOSIS — R202 Paresthesia of skin: Secondary | ICD-10-CM | POA: Diagnosis not present

## 2016-08-18 DIAGNOSIS — K219 Gastro-esophageal reflux disease without esophagitis: Secondary | ICD-10-CM

## 2016-08-18 LAB — COMPREHENSIVE METABOLIC PANEL
ALT: 26 U/L (ref 17–63)
ANION GAP: 7 (ref 5–15)
AST: 42 U/L — AB (ref 15–41)
Albumin: 3.7 g/dL (ref 3.5–5.0)
Alkaline Phosphatase: 95 U/L (ref 38–126)
BILIRUBIN TOTAL: 0.8 mg/dL (ref 0.3–1.2)
BUN: 11 mg/dL (ref 6–20)
CHLORIDE: 106 mmol/L (ref 101–111)
CO2: 23 mmol/L (ref 22–32)
Calcium: 8.9 mg/dL (ref 8.9–10.3)
Creatinine, Ser: 0.68 mg/dL (ref 0.61–1.24)
GFR calc Af Amer: >60 mL/min (ref >60)
Glucose, Bld: 133 mg/dL — ABNORMAL HIGH (ref 65–99)
POTASSIUM: 4 mmol/L (ref 3.5–5.1)
Sodium: 136 mmol/L (ref 135–145)
TOTAL PROTEIN: 7.3 g/dL (ref 6.5–8.1)

## 2016-08-18 LAB — CBC WITH DIFFERENTIAL/PLATELET
BASOS ABS: 0 10*3/uL (ref 0–0.1)
BASOS PCT: 0 %
EOS PCT: 1 %
Eosinophils Absolute: 0.1 10*3/uL (ref 0–0.7)
HEMATOCRIT: 42.5 % (ref 40.0–52.0)
Hemoglobin: 14.3 g/dL (ref 13.0–18.0)
LYMPHS PCT: 26 %
Lymphs Abs: 1.4 10*3/uL (ref 1.0–3.6)
MCH: 31.4 pg (ref 26.0–34.0)
MCHC: 33.7 g/dL (ref 32.0–36.0)
MCV: 93.2 fL (ref 80.0–100.0)
MONO ABS: 0.9 10*3/uL (ref 0.2–1.0)
MONOS PCT: 16 %
NEUTROS ABS: 3 10*3/uL (ref 1.4–6.5)
Neutrophils Relative %: 57 %
PLATELETS: 101 10*3/uL — AB (ref 150–440)
RBC: 4.56 MIL/uL (ref 4.40–5.90)
RDW: 19.9 % — AB (ref 11.5–14.5)
WBC: 5.4 10*3/uL (ref 3.8–10.6)

## 2016-08-18 MED ORDER — SODIUM CHLORIDE 0.9 % IV SOLN
INTRAVENOUS | Status: DC
  Administered 2016-08-18: 11:00:00 via INTRAVENOUS
  Filled 2016-08-18: qty 1000

## 2016-08-18 MED ORDER — FLUOROURACIL CHEMO INJECTION 5 GM/100ML
2400.0000 mg/m2 | INTRAVENOUS | Status: DC
  Administered 2016-08-18: 4950 mg via INTRAVENOUS
  Filled 2016-08-18: qty 99

## 2016-08-18 MED ORDER — FLUOROURACIL CHEMO INJECTION 2.5 GM/50ML
400.0000 mg/m2 | Freq: Once | INTRAVENOUS | Status: AC
  Administered 2016-08-18: 800 mg via INTRAVENOUS
  Filled 2016-08-18: qty 16

## 2016-08-18 MED ORDER — DEXAMETHASONE SODIUM PHOSPHATE 10 MG/ML IJ SOLN
10.0000 mg | Freq: Once | INTRAMUSCULAR | Status: AC
  Administered 2016-08-18: 10 mg via INTRAVENOUS
  Filled 2016-08-18: qty 1

## 2016-08-18 MED ORDER — LEUCOVORIN CALCIUM INJECTION 100 MG
20.0000 mg/m2 | Freq: Once | INTRAMUSCULAR | Status: AC
  Administered 2016-08-18: 38 mg via INTRAVENOUS
  Filled 2016-08-18: qty 1.9

## 2016-08-18 MED ORDER — DEXAMETHASONE SODIUM PHOSPHATE 100 MG/10ML IJ SOLN: 10.0000 mg | Freq: Once | INTRAMUSCULAR | Status: DC

## 2016-08-18 MED ORDER — SODIUM CHLORIDE 0.9% FLUSH
10.0000 mL | Freq: Once | INTRAVENOUS | Status: AC
  Administered 2016-08-18: 10 mL via INTRAVENOUS
  Filled 2016-08-18: qty 10

## 2016-08-18 MED ORDER — LEUCOVORIN CALCIUM INJECTION 350 MG: 400.0000 mg/m2 | Freq: Once | INTRAVENOUS | Status: DC

## 2016-08-18 NOTE — Progress Notes (Signed)
Patient is here for follow up, he is doing well no complaints  

## 2016-08-18 NOTE — Progress Notes (Signed)
[place] NOTE  Patient Care Team: Marden Noble, MD as PCP - General (Internal Medicine) Marden Noble, MD (Internal Medicine) Christene Lye, MD (General Surgery)  CHIEF COMPLAINTS/PURPOSE OF CONSULTATION:  Oncology History   # June-July 2017-Ascending COLON CA STAGE III [pT3pN2 (4/14LN positive); POSITIVE RADIAL MARGIN; Dr.Sankar]; pre-op CEA- 1.5/N; MRI- liver-NEG; 03/26/2016- PET-NED   # June 24th 2017-  FOLFOX q 2 W  # IDA- [aug 2017] s/p IV venofer x4.    # MOLECULAR TESTING: MSI-STABLE; F-ONE-[july 2017] B- RAF V 600 E; Wild type Kras/N-ras ; others**     Cancer of ascending colon ([place])   02/26/2016 Initial Diagnosis    Cancer of ascending colon ([facility])        HISTORY OF PRESENTING ILLNESS:  Philip Shah 60 y.o.  male above history of stage III colon cancer is Currently on adjuvant FOLFOX Status post 10 treatments so far.  Patient admits to tingling and numbness of his finger tips/feet. He is having difficulty with buttoning his shirt. Denies any diarrhea.  Denies abdominal pain. No nausea no vomiting. No bleeding. No nausea vomiting.    ROS: A complete 10 point review of system is done which is negative except mentioned above in history of present illness.   MEDICAL HISTORY:  Past Medical History:  Diagnosis Date  . Abnormal colonoscopy 02-2016  . Anemia   . Enlarged prostate   . GERD (gastroesophageal reflux disease)   . H/O vasectomy 1  . Hemorrhoids   . Hx of ligation of vein 1981  . Sciatica   . Status post colon resection 02-26-16    SURGICAL HISTORY: Past Surgical History:  Procedure Laterality Date  . APPENDECTOMY    . COLONOSCOPY WITH PROPOFOL N/A 02/11/2016   Procedure: COLONOSCOPY WITH PROPOFOL;  Surgeon: Christene Lye, MD;  Location: [facility];  Service: Endoscopy;  Laterality: N/A;  . ESOPHAGOGASTRODUODENOSCOPY (EGD) WITH PROPOFOL N/A 02/11/2016   Procedure: ESOPHAGOGASTRODUODENOSCOPY (EGD) WITH  PROPOFOL;  Surgeon: Christene Lye, MD;  Location: [facility];  Service: Endoscopy;  Laterality: N/A;  . LAPAROSCOPIC RIGHT COLECTOMY Right 02/26/2016   Procedure: LAPAROSCOPIC RIGHT COLECTOMY;  Surgeon: Christene Lye, MD;  Location: [facility];  Service: General;  Laterality: Right;  . PORTACATH PLACEMENT N/A 03/18/2016   Procedure: INSERTION PORT-A-CATH;  Surgeon: Christene Lye, MD;  Location: [facility];  Service: General;  Laterality: N/A;  . VASECTOMY    . 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 Marital status: Single    Spouse name: N/A  . Number of children: N/A  . Years of education: N/A   Occupational History  . Not on file.   Social History Main Topics  . Smoking status: Never Smoker  . Smokeless tobacco: Never Used  . Alcohol use No     Comment: 3/day  . Drug use: No  . Sexual activity: Not on file   Other Topics Concern  . Not on file   Social History Narrative  . No narrative on file    FAMILY HISTORY: mat aunt- anal cancer; oldest brother/pre-cancerous polyps- [Dr. In [place]] Family History  Problem Relation Age of Onset  . Kidney failure Mother   . Cancer Father     spine    ALLERGIES:  is allergic to penicillins.  MEDICATIONS:  Current Outpatient  Prescriptions  Medication Sig Dispense Refill  . ferrous sulfate (FERROUSUL) 325 (65 FE) MG tablet Take 1 tablet (325 mg total) by mouth daily with breakfast. 30 tablet 3  . lidocaine-prilocaine (EMLA) cream Apply 1 application topically as needed. Apply generously over the Mediport 45 minutes prior to chemotherapy. 30 g 0  . Multiple Vitamin (MULTIVITAMIN WITH MINERALS) TABS tablet Take 1 tablet by mouth daily.    . ondansetron (ZOFRAN) 8 MG tablet Take 1 tablet (8 mg total) by mouth every 8 (eight) hours as needed for nausea or vomiting  (start 3 days; after chemo). (Patient not taking: Reported on 08/18/2016) 40 tablet 1   No current facility-administered medications for this visit.       Marland Kitchen  PHYSICAL EXAMINATION: ECOG PERFORMANCE STATUS: 0 - Asymptomatic  Vitals:   08/18/16 1010  BP: 126/80  Pulse: 83  Resp: 18  Temp: 98 F (36.7 C)   Filed Weights   08/18/16 1010  Weight: 183 lb 3.2 oz (83.1 kg)    GENERAL: Well-nourished well-developed; Alert, no distress and comfortable. He is alone. EYES: no pallor or icterus OROPHARYNX: no thrush or ulceration; good dentition  NECK: supple, no masses felt LYMPH:  no palpable lymphadenopathy in the cervical, axillary or inguinal regions LUNGS: clear to auscultation and  No wheeze or crackles HEART/CVS: regular rate & rhythm and no murmurs; No lower extremity edema ABDOMEN: abdomen soft, non-tender and normal bowel sounds.  Musculoskeletal:no cyanosis of digits and no clubbing  PSYCH: alert & oriented x 3 with fluent speech NEURO: no focal motor/sensory deficits SKIN:  no rashes or significant lesions  LABORATORY DATA:  I have reviewed the data as listed Lab Results  Component Value Date   WBC 5.4 08/18/2016   HGB 14.3 08/18/2016   HCT 42.5 08/18/2016   MCV 93.2 08/18/2016   PLT 101 (L) 08/18/2016    Recent Labs  07/21/16 0840 08/04/16 0819 08/18/16 0948  NA 137 138 136  K 4.0 3.9 4.0  CL 108 108 106  CO2 _0 GLUCOSE 143* 158* 133*  BUN _1 CREATININE 0.70 0.70 0.68  CALCIUM 8.9 8.7* 8.9  GFRNONAA >60 >60 >60  GFRAA >60 >60 >60  PROT 7.2 6.8 7.3  ALBUMIN 3.7 3.5 3.7  AST 40 40 42*  ALT _2 ALKPHOS 92 83 95  BILITOT 0.9 0.9 0.8    RADIOGRAPHIC STUDIES: I have personally reviewed the radiological images as listed and agreed with the findings in the report. No results found.  ASSESSMENT & PLAN:   Cancer of ascending colon ([place]) # Ascending colon cancer; stage III on adjuvant chemotherapy with FOLFOX every 2 weeks 12.  Given the positive margin- at surgery; plan RT post chemo [starting Jan 2nd]. Plan of colonoscopy in June 2018.   # Proceed with FOLFOX chemotherapy cycle #11  today. Patient tolerating chemotherapy well except worsening neuropathy. 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 Discontinue Oxaliplatin.   # Follow-up with me in 2 week with chemotherapy/labs/ X- MD.; follow up with me in 3rd week of Jan with labs/Me.      Cammie Sickle, MD 08/18/2016 5:07 PM

## 2016-08-18 NOTE — Assessment & Plan Note (Addendum)
#   Ascending colon cancer; stage III on adjuvant chemotherapy with FOLFOX every 2 weeks 12. Given the positive margin- at surgery; plan RT post chemo [starting Jan 2nd]. Plan of colonoscopy in June 2018.   # Proceed with FOLFOX chemotherapy cycle #11  today. Patient tolerating chemotherapy well except worsening neuropathy. 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 Discontinue Oxaliplatin.   # Follow-up with me in 2 week with chemotherapy/labs/ X- MD.; follow up with me in 3rd week of Jan with labs/Me.

## 2016-08-20 ENCOUNTER — Inpatient Hospital Stay: Payer: BLUE CROSS/BLUE SHIELD

## 2016-08-20 VITALS — BP 120/74 | HR 80 | Temp 98.0°F | Resp 18

## 2016-08-20 DIAGNOSIS — C182 Malignant neoplasm of ascending colon: Secondary | ICD-10-CM | POA: Diagnosis not present

## 2016-08-20 MED ORDER — SODIUM CHLORIDE 0.9% FLUSH
10.0000 mL | INTRAVENOUS | Status: DC | PRN
  Administered 2016-08-20: 10 mL
  Filled 2016-08-20: qty 10

## 2016-08-20 MED ORDER — HEPARIN SOD (PORK) LOCK FLUSH 100 UNIT/ML IV SOLN
500.0000 [IU] | Freq: Once | INTRAVENOUS | Status: AC | PRN
  Administered 2016-08-20: 500 [IU]
  Filled 2016-08-20: qty 5

## 2016-08-25 ENCOUNTER — Other Ambulatory Visit: Payer: Self-pay | Admitting: *Deleted

## 2016-08-25 DIAGNOSIS — C182 Malignant neoplasm of ascending colon: Secondary | ICD-10-CM | POA: Diagnosis not present

## 2016-08-31 ENCOUNTER — Other Ambulatory Visit: Payer: Self-pay | Admitting: Oncology

## 2016-09-01 ENCOUNTER — Inpatient Hospital Stay: Payer: BLUE CROSS/BLUE SHIELD

## 2016-09-01 ENCOUNTER — Inpatient Hospital Stay (HOSPITAL_BASED_OUTPATIENT_CLINIC_OR_DEPARTMENT_OTHER): Payer: BLUE CROSS/BLUE SHIELD | Admitting: Oncology

## 2016-09-01 VITALS — BP 152/79 | HR 88 | Temp 98.6°F | Wt 183.0 lb

## 2016-09-01 DIAGNOSIS — C182 Malignant neoplasm of ascending colon: Secondary | ICD-10-CM

## 2016-09-01 DIAGNOSIS — J219 Acute bronchiolitis, unspecified: Secondary | ICD-10-CM

## 2016-09-01 DIAGNOSIS — R202 Paresthesia of skin: Secondary | ICD-10-CM | POA: Diagnosis not present

## 2016-09-01 DIAGNOSIS — N400 Enlarged prostate without lower urinary tract symptoms: Secondary | ICD-10-CM

## 2016-09-01 DIAGNOSIS — G629 Polyneuropathy, unspecified: Secondary | ICD-10-CM

## 2016-09-01 DIAGNOSIS — Z79899 Other long term (current) drug therapy: Secondary | ICD-10-CM

## 2016-09-01 DIAGNOSIS — R200 Anesthesia of skin: Secondary | ICD-10-CM

## 2016-09-01 LAB — CBC WITH DIFFERENTIAL/PLATELET
Basophils Absolute: 0 10*3/uL (ref 0–0.1)
Basophils Relative: 0 %
Eosinophils Absolute: 0.1 10*3/uL (ref 0–0.7)
Eosinophils Relative: 1 %
HCT: 43.8 % (ref 40.0–52.0)
HEMOGLOBIN: 14.9 g/dL (ref 13.0–18.0)
LYMPHS ABS: 1.2 10*3/uL (ref 1.0–3.6)
LYMPHS PCT: 22 %
MCH: 32.1 pg (ref 26.0–34.0)
MCHC: 34.1 g/dL (ref 32.0–36.0)
MCV: 94 fL (ref 80.0–100.0)
Monocytes Absolute: 0.8 10*3/uL (ref 0.2–1.0)
Monocytes Relative: 13 %
NEUTROS PCT: 64 %
Neutro Abs: 3.6 10*3/uL (ref 1.4–6.5)
Platelets: 116 10*3/uL — ABNORMAL LOW (ref 150–440)
RBC: 4.66 MIL/uL (ref 4.40–5.90)
RDW: 18.9 % — ABNORMAL HIGH (ref 11.5–14.5)
WBC: 5.7 10*3/uL (ref 3.8–10.6)

## 2016-09-01 LAB — COMPREHENSIVE METABOLIC PANEL
ALT: 26 U/L (ref 17–63)
AST: 37 U/L (ref 15–41)
Albumin: 3.7 g/dL (ref 3.5–5.0)
Alkaline Phosphatase: 102 U/L (ref 38–126)
Anion gap: 6 (ref 5–15)
BUN: 13 mg/dL (ref 6–20)
CALCIUM: 9 mg/dL (ref 8.9–10.3)
CO2: 23 mmol/L (ref 22–32)
CREATININE: 0.74 mg/dL (ref 0.61–1.24)
Chloride: 108 mmol/L (ref 101–111)
Glucose, Bld: 154 mg/dL — ABNORMAL HIGH (ref 65–99)
Potassium: 4 mmol/L (ref 3.5–5.1)
SODIUM: 137 mmol/L (ref 135–145)
Total Bilirubin: 0.7 mg/dL (ref 0.3–1.2)
Total Protein: 7.3 g/dL (ref 6.5–8.1)

## 2016-09-01 MED ORDER — DEXAMETHASONE SODIUM PHOSPHATE 10 MG/ML IJ SOLN
10.0000 mg | Freq: Once | INTRAMUSCULAR | Status: AC
Start: 2016-09-01 — End: 2016-09-01
  Administered 2016-09-01: 10 mg via INTRAVENOUS
  Filled 2016-09-01: qty 1

## 2016-09-01 MED ORDER — FLUOROURACIL CHEMO INJECTION 2.5 GM/50ML
400.0000 mg/m2 | Freq: Once | INTRAVENOUS | Status: AC
  Administered 2016-09-01: 800 mg via INTRAVENOUS
  Filled 2016-09-01: qty 16

## 2016-09-01 MED ORDER — LEUCOVORIN CALCIUM INJECTION 100 MG
20.0000 mg/m2 | Freq: Once | INTRAMUSCULAR | Status: AC
  Administered 2016-09-01: 38 mg via INTRAVENOUS
  Filled 2016-09-01: qty 1.9

## 2016-09-01 MED ORDER — SODIUM CHLORIDE 0.9 % IV SOLN
2400.0000 mg/m2 | INTRAVENOUS | Status: DC
  Administered 2016-09-01: 4950 mg via INTRAVENOUS
  Filled 2016-09-01: qty 99

## 2016-09-01 MED ORDER — LEUCOVORIN CALCIUM INJECTION 350 MG: 400.0000 mg/m2 | Freq: Once | INTRAMUSCULAR | Status: DC

## 2016-09-01 MED ORDER — SODIUM CHLORIDE 0.9 % IV SOLN: 10.0000 mg | Freq: Once | INTRAVENOUS | Status: DC

## 2016-09-01 NOTE — Progress Notes (Signed)
[facility] NOTE  Patient Care Team: Marden Noble, MD as PCP - General (Internal Medicine) Marden Noble, MD (Internal Medicine) Christene Lye, MD (General Surgery)  CHIEF COMPLAINTS/PURPOSE OF CONSULTATION:  Oncology History   # June-July 2017-Ascending COLON CA STAGE III [pT3pN2 (4/14LN positive); POSITIVE RADIAL MARGIN; Dr.Sankar]; pre-op CEA- 1.5/N; MRI- liver-NEG; 03/26/2016- PET-NED   # June 24th 2017-  FOLFOX q 2 W  # IDA- [aug 2017] s/p IV venofer x4.    # MOLECULAR TESTING: MSI-STABLE; F-ONE-[july 2017] B- RAF V 600 E; Wild type Kras/N-ras ; others**     Cancer of ascending colon ([place])   02/26/2016 Initial Diagnosis    Cancer of ascending colon ([facility])        HISTORY OF PRESENTING ILLNESS:  Philip Shah 60 y.o.  male above history of stage III colon cancer is Currently on adjuvant FOLFOX Status post 11 treatments so far.  Patient admits to continued tingling and numbness of both finger tips/feet. He admits to a decrease of tingling and numbness  in fingertips but no change in toes. Denies any diarrhea.  Denies abdominal pain. No nausea no vomiting. No bleeding. No nausea vomiting.    ROS: A complete 10 point review of system is done which is negative except mentioned above in history of present illness.   MEDICAL HISTORY:  Past Medical History:  Diagnosis Date  . Abnormal colonoscopy 02-2016  . Anemia   . Enlarged prostate   . GERD (gastroesophageal reflux disease)   . H/O vasectomy 21  . Hemorrhoids   . Hx of ligation of vein 1981  . Sciatica   . Status post colon resection 02-26-16    SURGICAL HISTORY: Past Surgical History:  Procedure Laterality Date  . APPENDECTOMY    . COLONOSCOPY WITH PROPOFOL N/A 02/11/2016   Procedure: COLONOSCOPY WITH PROPOFOL;  Surgeon: Christene Lye, MD;  Location: [facility];  Service: Endoscopy;  Laterality: N/A;  . ESOPHAGOGASTRODUODENOSCOPY (EGD) WITH PROPOFOL N/A 02/11/2016    Procedure: ESOPHAGOGASTRODUODENOSCOPY (EGD) WITH PROPOFOL;  Surgeon: Christene Lye, MD;  Location: [facility];  Service: Endoscopy;  Laterality: N/A;  . LAPAROSCOPIC RIGHT COLECTOMY Right 02/26/2016   Procedure: LAPAROSCOPIC RIGHT COLECTOMY;  Surgeon: Christene Lye, MD;  Location: [facility];  Service: General;  Laterality: Right;  . PORTACATH PLACEMENT N/A 03/18/2016   Procedure: INSERTION PORT-A-CATH;  Surgeon: Christene Lye, MD;  Location: [facility];  Service: General;  Laterality: N/A;  . VASECTOMY    . 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 Marital status: Single    Spouse name: N/A  . Number of children: N/A  . Years of education: N/A   Occupational History  . Not on file.   Social History Main Topics  . Smoking status: Never Smoker  . Smokeless tobacco: Never Used  . Alcohol use No     Comment: 3/day  . Drug use: No  . Sexual activity: Not on file   Other Topics Concern  . Not on file   Social History Narrative  . No narrative on file    FAMILY HISTORY: mat aunt- anal cancer; oldest brother/pre-cancerous polyps- [Dr. In [place]] Family History  Problem Relation Age of Onset  . Kidney failure Mother   . Cancer Father     spine    ALLERGIES:  is allergic to penicillins.  MEDICATIONS:  Current Outpatient Prescriptions  Medication Sig Dispense Refill  . ferrous sulfate (FERROUSUL) 325 (65 FE) MG tablet Take 1 tablet (325 mg total) by mouth daily with breakfast. 30 tablet 3  . lidocaine-prilocaine (EMLA) cream Apply 1 application topically as needed. Apply generously over the Mediport 45 minutes prior to chemotherapy. 30 g 0  . Multiple Vitamin (MULTIVITAMIN WITH MINERALS) TABS tablet Take 1 tablet by mouth daily.    . ondansetron (ZOFRAN) 8 MG tablet Take 1 tablet (8 mg total) by mouth every 8  (eight) hours as needed for nausea or vomiting (start 3 days; after chemo). 40 tablet 1   No current facility-administered medications for this visit.    Facility-Administered Medications Ordered in Other Visits  Medication Dose Route Frequency Provider Last Rate Last Dose  . fluorouracil (ADRUCIL) 4,950 mg in sodium chloride 0.9 % 51 mL chemo infusion  2,400 mg/m2 (Treatment Plan Recorded) Intravenous 1 day or 1 dose Lloyd Huger, MD   4,950 mg at 09/01/16 1140      .  PHYSICAL EXAMINATION: ECOG PERFORMANCE STATUS: 0 - Asymptomatic  Vitals:   09/01/16 1029  BP: (!) 152/79  Pulse: 88  Temp: 98.6 F (37 C)   Filed Weights   09/01/16 1029  Weight: 182 lb 15.7 oz (83 kg)    GENERAL: Well-nourished well-developed; Alert, no distress and comfortable. He is alone. EYES: no pallor or icterus OROPHARYNX: no thrush or ulceration; good dentition  NECK: supple, no masses felt LYMPH:  no palpable lymphadenopathy in the cervical, axillary or inguinal regions LUNGS: clear to auscultation and  No wheeze or crackles HEART/CVS: regular rate & rhythm and no murmurs; No lower extremity edema ABDOMEN: abdomen soft, non-tender and normal bowel sounds.  Musculoskeletal:no cyanosis of digits and no clubbing  PSYCH: alert & oriented x 3 with fluent speech NEURO: no focal motor/sensory deficits SKIN:  no rashes or significant lesions  LABORATORY DATA:  I have reviewed the data as listed Lab Results  Component Value Date   WBC 5.7 09/01/2016   HGB 14.9 09/01/2016   HCT 43.8 09/01/2016   MCV 94.0 09/01/2016   PLT 116 (L) 09/01/2016    Recent Labs  08/04/16 0819 08/18/16 0948 09/01/16 0938  NA 138 136 137  K 3.9 4.0 4.0  CL 108 106 108  CO2 '24 23 23  ' GLUCOSE 158* 133* 154*  BUN '13 11 13  ' CREATININE 0.70 0.68 0.74  CALCIUM 8.7* 8.9 9.0  GFRNONAA >60 >60 >60  GFRAA >60 >60 >60  PROT 6.8 7.3 7.3  ALBUMIN 3.5 3.7 3.7  AST 40 42* 37  ALT '25 26 26  ' ALKPHOS 83 95 102   BILITOT 0.9 0.8 0.7    RADIOGRAPHIC STUDIES: I have personally reviewed the radiological images as listed and agreed with the findings in the report. No results found.  ASSESSMENT & PLAN:   # Ascending colon cancer; stage III on adjuvant chemotherapy with FOLFOX every 2 weeks 12. Given the positive margin- at surgery; plan RT post chemo [starting Jan 2nd]. Plan of colonoscopy in June 2018.   # Proceed with FOLFOX chemotherapy cycle #12 today. Patient tolerating chemotherapy well. His neuropathy is better since discontinuing Oxaliplatin in November. Labs are adequate today for treatment.   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 Discontinued Oxaliplatin. Each week his PN is getting better. Has been off since November   # Follow-up in 3 weeks for  labs and to see Dr. Jeani Sow, NP  09/01/16 10:50AM    Patient was seen and evaluated independently and I agree with the assessment and plan as dictated above.  Lloyd Huger, MD 09/03/16 10:57 AM

## 2016-09-01 NOTE — Progress Notes (Signed)
Patient here today for follow up on colon cancer.  Patient states that neuropathy in fingers is doing better with the change in treatment, no change in his toes as of now.

## 2016-09-02 ENCOUNTER — Inpatient Hospital Stay: Payer: BLUE CROSS/BLUE SHIELD

## 2016-09-03 ENCOUNTER — Ambulatory Visit
Admission: RE | Admit: 2016-09-03 | Discharge: 2016-09-03 | Disposition: A | Discharge status: Home / Self Care | Payer: BLUE CROSS/BLUE SHIELD | Source: Ambulatory Visit | Attending: Radiation Oncology | Admitting: Radiation Oncology

## 2016-09-03 ENCOUNTER — Inpatient Hospital Stay: Payer: BLUE CROSS/BLUE SHIELD

## 2016-09-03 VITALS — BP 117/73 | HR 88 | Resp 20

## 2016-09-03 DIAGNOSIS — C182 Malignant neoplasm of ascending colon: Secondary | ICD-10-CM

## 2016-09-03 MED ORDER — HEPARIN SOD (PORK) LOCK FLUSH 100 UNIT/ML IV SOLN
500.0000 [IU] | Freq: Once | INTRAVENOUS | Status: AC | PRN
  Administered 2016-09-03: 500 [IU]
  Filled 2016-09-03: qty 5

## 2016-09-07 ENCOUNTER — Ambulatory Visit
Admission: RE | Admit: 2016-09-07 | Discharge: 2016-09-07 | Disposition: A | Discharge status: Home / Self Care | Payer: BLUE CROSS/BLUE SHIELD | Source: Ambulatory Visit | Attending: Radiation Oncology | Admitting: Radiation Oncology

## 2016-09-07 DIAGNOSIS — C182 Malignant neoplasm of ascending colon: Secondary | ICD-10-CM | POA: Diagnosis not present

## 2016-09-08 ENCOUNTER — Ambulatory Visit
Admission: RE | Admit: 2016-09-08 | Discharge: 2016-09-08 | Disposition: A | Discharge status: Home / Self Care | Payer: BLUE CROSS/BLUE SHIELD | Source: Ambulatory Visit | Attending: Radiation Oncology | Admitting: Radiation Oncology

## 2016-09-08 DIAGNOSIS — C182 Malignant neoplasm of ascending colon: Secondary | ICD-10-CM | POA: Diagnosis not present

## 2016-09-09 ENCOUNTER — Ambulatory Visit
Admission: RE | Admit: 2016-09-09 | Discharge: 2016-09-09 | Disposition: A | Discharge status: Home / Self Care | Payer: BLUE CROSS/BLUE SHIELD | Source: Ambulatory Visit | Attending: Radiation Oncology | Admitting: Radiation Oncology

## 2016-09-09 DIAGNOSIS — C182 Malignant neoplasm of ascending colon: Secondary | ICD-10-CM | POA: Diagnosis not present

## 2016-09-10 ENCOUNTER — Ambulatory Visit
Admission: RE | Admit: 2016-09-10 | Discharge: 2016-09-10 | Disposition: A | Discharge status: Home / Self Care | Payer: BLUE CROSS/BLUE SHIELD | Source: Ambulatory Visit | Attending: Radiation Oncology | Admitting: Radiation Oncology

## 2016-09-10 DIAGNOSIS — C182 Malignant neoplasm of ascending colon: Secondary | ICD-10-CM | POA: Diagnosis not present

## 2016-09-13 ENCOUNTER — Ambulatory Visit
Admission: RE | Admit: 2016-09-13 | Discharge: 2016-09-13 | Disposition: A | Discharge status: Home / Self Care | Payer: BLUE CROSS/BLUE SHIELD | Source: Ambulatory Visit | Attending: Radiation Oncology | Admitting: Radiation Oncology

## 2016-09-13 DIAGNOSIS — C182 Malignant neoplasm of ascending colon: Secondary | ICD-10-CM | POA: Diagnosis not present

## 2016-09-14 ENCOUNTER — Ambulatory Visit
Admission: RE | Admit: 2016-09-14 | Discharge: 2016-09-14 | Disposition: A | Discharge status: Home / Self Care | Payer: BLUE CROSS/BLUE SHIELD | Source: Ambulatory Visit | Attending: Radiation Oncology | Admitting: Radiation Oncology

## 2016-09-14 DIAGNOSIS — C182 Malignant neoplasm of ascending colon: Secondary | ICD-10-CM | POA: Diagnosis not present

## 2016-09-15 ENCOUNTER — Ambulatory Visit
Admission: RE | Admit: 2016-09-15 | Discharge: 2016-09-15 | Disposition: A | Discharge status: Home / Self Care | Payer: BLUE CROSS/BLUE SHIELD | Source: Ambulatory Visit | Attending: Radiation Oncology | Admitting: Radiation Oncology

## 2016-09-15 DIAGNOSIS — C182 Malignant neoplasm of ascending colon: Secondary | ICD-10-CM | POA: Diagnosis not present

## 2016-09-16 ENCOUNTER — Ambulatory Visit
Admission: RE | Admit: 2016-09-16 | Discharge: 2016-09-16 | Disposition: A | Discharge status: Home / Self Care | Payer: BLUE CROSS/BLUE SHIELD | Source: Ambulatory Visit | Attending: Radiation Oncology | Admitting: Radiation Oncology

## 2016-09-16 DIAGNOSIS — C182 Malignant neoplasm of ascending colon: Secondary | ICD-10-CM | POA: Diagnosis not present

## 2016-09-17 ENCOUNTER — Ambulatory Visit
Admission: RE | Admit: 2016-09-17 | Discharge: 2016-09-17 | Disposition: A | Discharge status: Home / Self Care | Payer: BLUE CROSS/BLUE SHIELD | Source: Ambulatory Visit | Attending: Radiation Oncology | Admitting: Radiation Oncology

## 2016-09-17 DIAGNOSIS — C182 Malignant neoplasm of ascending colon: Secondary | ICD-10-CM | POA: Diagnosis not present

## 2016-09-20 ENCOUNTER — Ambulatory Visit
Admission: RE | Admit: 2016-09-20 | Discharge: 2016-09-20 | Disposition: A | Discharge status: Home / Self Care | Payer: BLUE CROSS/BLUE SHIELD | Source: Ambulatory Visit | Attending: Radiation Oncology | Admitting: Radiation Oncology

## 2016-09-20 DIAGNOSIS — C182 Malignant neoplasm of ascending colon: Secondary | ICD-10-CM | POA: Diagnosis not present

## 2016-09-21 ENCOUNTER — Ambulatory Visit
Admission: RE | Admit: 2016-09-21 | Discharge: 2016-09-21 | Disposition: A | Discharge status: Home / Self Care | Payer: BLUE CROSS/BLUE SHIELD | Source: Ambulatory Visit | Attending: Radiation Oncology | Admitting: Radiation Oncology

## 2016-09-21 DIAGNOSIS — C182 Malignant neoplasm of ascending colon: Secondary | ICD-10-CM | POA: Diagnosis not present

## 2016-09-22 ENCOUNTER — Inpatient Hospital Stay: Payer: BLUE CROSS/BLUE SHIELD | Admitting: Internal Medicine

## 2016-09-22 ENCOUNTER — Ambulatory Visit: Payer: BLUE CROSS/BLUE SHIELD

## 2016-09-22 ENCOUNTER — Inpatient Hospital Stay: Payer: BLUE CROSS/BLUE SHIELD

## 2016-09-23 ENCOUNTER — Ambulatory Visit: Payer: BLUE CROSS/BLUE SHIELD

## 2016-09-24 ENCOUNTER — Ambulatory Visit
Admission: RE | Admit: 2016-09-24 | Discharge: 2016-09-24 | Disposition: A | Discharge status: Home / Self Care | Payer: BLUE CROSS/BLUE SHIELD | Source: Ambulatory Visit | Attending: Radiation Oncology | Admitting: Radiation Oncology

## 2016-09-24 DIAGNOSIS — C182 Malignant neoplasm of ascending colon: Secondary | ICD-10-CM | POA: Diagnosis not present

## 2016-09-27 ENCOUNTER — Ambulatory Visit
Admission: RE | Admit: 2016-09-27 | Discharge: 2016-09-27 | Disposition: A | Discharge status: Home / Self Care | Payer: BLUE CROSS/BLUE SHIELD | Source: Ambulatory Visit | Attending: Radiation Oncology | Admitting: Radiation Oncology

## 2016-09-27 DIAGNOSIS — C182 Malignant neoplasm of ascending colon: Secondary | ICD-10-CM | POA: Diagnosis not present

## 2016-09-28 ENCOUNTER — Ambulatory Visit
Admission: RE | Admit: 2016-09-28 | Discharge: 2016-09-28 | Disposition: A | Discharge status: Home / Self Care | Payer: BLUE CROSS/BLUE SHIELD | Source: Ambulatory Visit | Attending: Radiation Oncology | Admitting: Radiation Oncology

## 2016-09-28 DIAGNOSIS — C182 Malignant neoplasm of ascending colon: Secondary | ICD-10-CM | POA: Diagnosis not present

## 2016-09-29 ENCOUNTER — Inpatient Hospital Stay (HOSPITAL_BASED_OUTPATIENT_CLINIC_OR_DEPARTMENT_OTHER): Payer: BLUE CROSS/BLUE SHIELD | Admitting: Internal Medicine

## 2016-09-29 ENCOUNTER — Ambulatory Visit
Admission: RE | Admit: 2016-09-29 | Discharge: 2016-09-29 | Disposition: A | Discharge status: Home / Self Care | Payer: BLUE CROSS/BLUE SHIELD | Source: Ambulatory Visit | Attending: Radiation Oncology | Admitting: Radiation Oncology

## 2016-09-29 ENCOUNTER — Inpatient Hospital Stay: Payer: BLUE CROSS/BLUE SHIELD | Attending: Internal Medicine

## 2016-09-29 VITALS — BP 127/85 | HR 77 | Temp 98.0°F | Wt 184.0 lb

## 2016-09-29 DIAGNOSIS — Z9049 Acquired absence of other specified parts of digestive tract: Secondary | ICD-10-CM | POA: Insufficient documentation

## 2016-09-29 DIAGNOSIS — Z9221 Personal history of antineoplastic chemotherapy: Secondary | ICD-10-CM | POA: Diagnosis not present

## 2016-09-29 DIAGNOSIS — K649 Unspecified hemorrhoids: Secondary | ICD-10-CM | POA: Insufficient documentation

## 2016-09-29 DIAGNOSIS — N400 Enlarged prostate without lower urinary tract symptoms: Secondary | ICD-10-CM | POA: Diagnosis not present

## 2016-09-29 DIAGNOSIS — K219 Gastro-esophageal reflux disease without esophagitis: Secondary | ICD-10-CM | POA: Insufficient documentation

## 2016-09-29 DIAGNOSIS — C182 Malignant neoplasm of ascending colon: Secondary | ICD-10-CM

## 2016-09-29 DIAGNOSIS — Z923 Personal history of irradiation: Secondary | ICD-10-CM

## 2016-09-29 DIAGNOSIS — D649 Anemia, unspecified: Secondary | ICD-10-CM | POA: Diagnosis not present

## 2016-09-29 DIAGNOSIS — G629 Polyneuropathy, unspecified: Secondary | ICD-10-CM | POA: Insufficient documentation

## 2016-09-29 DIAGNOSIS — D696 Thrombocytopenia, unspecified: Secondary | ICD-10-CM | POA: Diagnosis not present

## 2016-09-29 DIAGNOSIS — M5430 Sciatica, unspecified side: Secondary | ICD-10-CM | POA: Insufficient documentation

## 2016-09-29 DIAGNOSIS — Z79899 Other long term (current) drug therapy: Secondary | ICD-10-CM

## 2016-09-29 LAB — CBC WITH DIFFERENTIAL/PLATELET
Basophils Absolute: 0 10*3/uL (ref 0–0.1)
Basophils Relative: 1 %
EOS PCT: 1 %
Eosinophils Absolute: 0.1 10*3/uL (ref 0–0.7)
HCT: 44.4 % (ref 40.0–52.0)
Hemoglobin: 15.3 g/dL (ref 13.0–18.0)
LYMPHS ABS: 1 10*3/uL (ref 1.0–3.6)
LYMPHS PCT: 15 %
MCH: 32.5 pg (ref 26.0–34.0)
MCHC: 34.4 g/dL (ref 32.0–36.0)
MCV: 94.4 fL (ref 80.0–100.0)
MONO ABS: 0.8 10*3/uL (ref 0.2–1.0)
Monocytes Relative: 12 %
NEUTROS ABS: 4.9 10*3/uL (ref 1.4–6.5)
Neutrophils Relative %: 71 %
PLATELETS: 103 10*3/uL — AB (ref 150–440)
RBC: 4.71 MIL/uL (ref 4.40–5.90)
RDW: 16.7 % — ABNORMAL HIGH (ref 11.5–14.5)
WBC: 6.9 10*3/uL (ref 3.8–10.6)

## 2016-09-29 LAB — COMPREHENSIVE METABOLIC PANEL
ALT: 24 U/L (ref 17–63)
AST: 32 U/L (ref 15–41)
Albumin: 4.1 g/dL (ref 3.5–5.0)
Alkaline Phosphatase: 86 U/L (ref 38–126)
Anion gap: 4 — ABNORMAL LOW (ref 5–15)
BILIRUBIN TOTAL: 0.6 mg/dL (ref 0.3–1.2)
BUN: 14 mg/dL (ref 6–20)
CALCIUM: 9.1 mg/dL (ref 8.9–10.3)
CHLORIDE: 106 mmol/L (ref 101–111)
CO2: 27 mmol/L (ref 22–32)
CREATININE: 0.72 mg/dL (ref 0.61–1.24)
GFR calc Af Amer: >60 mL/min (ref >60)
Glucose, Bld: 70 mg/dL (ref 65–99)
Potassium: 4.3 mmol/L (ref 3.5–5.1)
Sodium: 137 mmol/L (ref 135–145)
TOTAL PROTEIN: 7.3 g/dL (ref 6.5–8.1)

## 2016-09-29 NOTE — Progress Notes (Signed)
Patient here today for follow up.  Patient states no new concerns today  

## 2016-09-29 NOTE — Assessment & Plan Note (Addendum)
#   Ascending colon cancer; stage III  S/p  adjuvant chemotherapy with FOLFOX every 2 weeks 12 [finished Dec 2017] Given the positive margin- at surgery; currently on RT [until feb 7th].  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    # Thrombocytopenia G-1- monitor for now.   # port flushed in 4 weeks.  # follow up again in 8 weeks; CT C/A/P/labs-  few days prior.

## 2016-09-29 NOTE — Progress Notes (Signed)
[place] NOTE  Patient Care Team: Marden Noble, MD as PCP - General (Internal Medicine) Marden Noble, MD (Internal Medicine) Christene Lye, MD (General Surgery)  CHIEF COMPLAINTS/PURPOSE OF CONSULTATION:  Oncology History   # June-July 2017-Ascending COLON CA STAGE III [pT3pN2 (4/14LN positive); POSITIVE RADIAL MARGIN; Dr.Sankar]; pre-op CEA- 1.5/N; MRI- liver-NEG; 03/26/2016- PET-NED   # June 24th 2017-  FOLFOX q 2 W  # IDA- [aug 2017] s/p IV venofer x4.    # MOLECULAR TESTING: MSI-STABLE; F-ONE-[july 2017] B- RAF V 600 E; Wild type Kras/N-ras ; others**     Cancer of ascending colon ([place])   02/26/2016 Initial Diagnosis    Cancer of ascending colon ([facility])        HISTORY OF PRESENTING ILLNESS:  Philip Shah 61 y.o.  male above history of stage III colon cancer is Currently Status post adjuvant FOLFOX Status approximately month ago.  Patient is currently getting radiation for close margins. He is tolerating radiation well. Denies any rash. Complains of mild fatigue.  Patient admits to continued tingling and numbness of his finger tips/feet. He is having difficulty with buttoning his shirt. However this is not getting any worse. He started to start any medications. Denies any diarrhea.  Denies abdominal pain. No nausea no vomiting. No bleeding. No nausea vomiting.    ROS: A complete 10 point review of system is done which is negative except mentioned above in history of present illness.   MEDICAL HISTORY:  Past Medical History:  Diagnosis Date  . Abnormal colonoscopy 02-2016  . Anemia   . Enlarged prostate   . GERD (gastroesophageal reflux disease)   . H/O vasectomy 57  . Hemorrhoids   . Hx of ligation of vein 1981  . Sciatica   . Status post colon resection 02-26-16    SURGICAL HISTORY: Past Surgical History:  Procedure Laterality Date  . APPENDECTOMY    . COLONOSCOPY WITH PROPOFOL N/A 02/11/2016   Procedure: COLONOSCOPY  WITH PROPOFOL;  Surgeon: Christene Lye, MD;  Location: [facility] END[facility]COPY;  Service: Endoscopy;  Laterality: N/A;  . ESOPHAGOGASTRODUODENOSCOPY (EGD) WITH PROPOFOL N/A 02/11/2016   Procedure: ESOPHAGOGASTRODUODENOSCOPY (EGD) WITH PROPOFOL;  Surgeon: Christene Lye, MD;  Location: [facility] ENDOSCOPY;  Service: Endoscopy;  Laterality: N/A;  . LAPAROSCOPIC RIGHT COLECTOMY Right 02/26/2016   Procedure: LAPAROSCOPIC RIGHT COLECTOMY;  Surgeon: Christene Lye, MD;  Location: [facility] ORS;  Service: General;  Laterality: Right;  . PORTACATH PLACEMENT N/A 03/18/2016   Procedure: INSERTION PORT-A-CATH;  Surgeon: Christene Lye, MD;  Location: [facility] ORS;  Service: General;  Laterality: N/A;  . VASECTOMY    . 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 Marital status: Single    Spouse name: N/A  . Number of children: N/A  . Years of education: N/A   Occupational History  . Not on file.   Social History Main Topics  . Smoking status: Never Smoker  . Smokeless tobacco: Never Used  . Alcohol use No     Comment: 3/day  . Drug use: No  . Sexual activity: Not on file   Other Topics Concern  . Not on file   Social History Narrative  . No narrative on file    FAMILY HISTORY: mat aunt- anal cancer; oldest brother/pre-cancerous polyps- [Dr. In [place]] Family History  Problem Relation Age of Onset  . Kidney failure Mother   . Cancer Father     spine    ALLERGIES:  is allergic to penicillins.  MEDICATIONS:  Current Outpatient Prescriptions  Medication Sig Dispense Refill  . ferrous sulfate (FERROUSUL) 325 (65 FE) MG tablet Take 1 tablet (325 mg total) by mouth daily with breakfast. 30 tablet 3  . lidocaine-prilocaine (EMLA) cream Apply 1 application topically as needed. Apply generously over the Mediport 45 minutes prior to chemotherapy.  30 g 0  . Multiple Vitamin (MULTIVITAMIN WITH MINERALS) TABS tablet Take 1 tablet by mouth daily.    . ondansetron (ZOFRAN) 8 MG tablet Take 1 tablet (8 mg total) by mouth every 8 (eight) hours as needed for nausea or vomiting (start 3 days; after chemo). 40 tablet 1   No current facility-administered medications for this visit.       Marland Kitchen  PHYSICAL EXAMINATION: ECOG PERFORMANCE STATUS: 0 - Asymptomatic  Vitals:   09/29/16 0908  BP: 127/85  Pulse: 77  Temp: 98 F (36.7 C)   Filed Weights   09/29/16 0908  Weight: 184 lb (83.5 kg)    GENERAL: Well-nourished well-developed; Alert, no distress and comfortable. He is alone. EYES: no pallor or icterus OROPHARYNX: no thrush or ulceration; good dentition  NECK: supple, no masses felt LYMPH:  no palpable lymphadenopathy in the cervical, axillary or inguinal regions LUNGS: clear to auscultation and  No wheeze or crackles HEART/CVS: regular rate & rhythm and no murmurs; No lower extremity edema ABDOMEN: abdomen soft, non-tender and normal bowel sounds.  Musculoskeletal:no cyanosis of digits and no clubbing  PSYCH: alert & oriented x 3 with fluent speech NEURO: no focal motor/sensory deficits SKIN:  no rashes or significant lesions  LABORATORY DATA:  I have reviewed the data as listed Lab Results  Component Value Date   WBC 6.9 09/29/2016   HGB 15.3 09/29/2016   HCT 44.4 09/29/2016   MCV 94.4 09/29/2016   PLT 103 (L) 09/29/2016    Recent Labs  08/18/16 0948 09/01/16 0938 09/29/16 0840  NA 136 137 137  K 4.0 4.0 4.3  CL 106 108 106  CO2 _0 GLUCOSE 133* 154* 70  BUN _1 CREATININE 0.68 0.74 0.72  CALCIUM 8.9 9.0 9.1  GFRNONAA >60 >60 >60  GFRAA >60 >60 >60  PROT 7.3 7.3 7.3  ALBUMIN 3.7 3.7 4.1  AST 42* 37 32  ALT _2 ALKPHOS 95 102 86  BILITOT 0.8 0.7 0.6    RADIOGRAPHIC STUDIES: I have personally reviewed the radiological images as listed and agreed with the findings in the report. No  results found.  ASSESSMENT & PLAN:   Cancer of ascending colon ([place]) # Ascending colon cancer; stage III  S/p  adjuvant chemotherapy with FOLFOX every 2 weeks 12 [finished Dec 2017] Given the positive margin- at surgery; currently on RT [until feb 7th].  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    # Thrombocytopenia G-1- monitor for now.   # port flushed in 4 weeks.  # follow up again in 8 weeks; CT C/A/P/labs-  few days prior.      Cammie Sickle, MD 09/29/2016 5:13 PM

## 2016-09-30 ENCOUNTER — Ambulatory Visit
Admission: RE | Admit: 2016-09-30 | Discharge: 2016-09-30 | Disposition: A | Discharge status: Home / Self Care | Payer: BLUE CROSS/BLUE SHIELD | Source: Ambulatory Visit | Attending: Radiation Oncology | Admitting: Radiation Oncology

## 2016-09-30 DIAGNOSIS — C182 Malignant neoplasm of ascending colon: Secondary | ICD-10-CM | POA: Diagnosis not present

## 2016-10-01 ENCOUNTER — Ambulatory Visit
Admission: RE | Admit: 2016-10-01 | Discharge: 2016-10-01 | Disposition: A | Discharge status: Home / Self Care | Payer: BLUE CROSS/BLUE SHIELD | Source: Ambulatory Visit | Attending: Radiation Oncology | Admitting: Radiation Oncology

## 2016-10-01 DIAGNOSIS — C182 Malignant neoplasm of ascending colon: Secondary | ICD-10-CM | POA: Diagnosis not present

## 2016-10-04 ENCOUNTER — Ambulatory Visit
Admission: RE | Admit: 2016-10-04 | Discharge: 2016-10-04 | Disposition: A | Discharge status: Home / Self Care | Payer: BLUE CROSS/BLUE SHIELD | Source: Ambulatory Visit | Attending: Radiation Oncology | Admitting: Radiation Oncology

## 2016-10-04 DIAGNOSIS — C182 Malignant neoplasm of ascending colon: Secondary | ICD-10-CM | POA: Diagnosis not present

## 2016-10-05 ENCOUNTER — Ambulatory Visit
Admission: RE | Admit: 2016-10-05 | Discharge: 2016-10-05 | Disposition: A | Discharge status: Home / Self Care | Payer: BLUE CROSS/BLUE SHIELD | Source: Ambulatory Visit | Attending: Radiation Oncology | Admitting: Radiation Oncology

## 2016-10-05 DIAGNOSIS — C182 Malignant neoplasm of ascending colon: Secondary | ICD-10-CM | POA: Diagnosis not present

## 2016-10-06 ENCOUNTER — Ambulatory Visit
Admission: RE | Admit: 2016-10-06 | Discharge: 2016-10-06 | Disposition: A | Discharge status: Home / Self Care | Payer: BLUE CROSS/BLUE SHIELD | Source: Ambulatory Visit | Attending: Radiation Oncology | Admitting: Radiation Oncology

## 2016-10-06 DIAGNOSIS — C182 Malignant neoplasm of ascending colon: Secondary | ICD-10-CM | POA: Diagnosis not present

## 2016-10-07 ENCOUNTER — Ambulatory Visit
Admission: RE | Admit: 2016-10-07 | Discharge: 2016-10-07 | Disposition: A | Discharge status: Home / Self Care | Payer: BLUE CROSS/BLUE SHIELD | Source: Ambulatory Visit | Attending: Radiation Oncology | Admitting: Radiation Oncology

## 2016-10-07 DIAGNOSIS — C182 Malignant neoplasm of ascending colon: Secondary | ICD-10-CM | POA: Diagnosis not present

## 2016-10-08 ENCOUNTER — Ambulatory Visit
Admission: RE | Admit: 2016-10-08 | Discharge: 2016-10-08 | Disposition: A | Discharge status: Home / Self Care | Payer: BLUE CROSS/BLUE SHIELD | Source: Ambulatory Visit | Attending: Radiation Oncology | Admitting: Radiation Oncology

## 2016-10-08 DIAGNOSIS — C182 Malignant neoplasm of ascending colon: Secondary | ICD-10-CM | POA: Diagnosis not present

## 2016-10-11 ENCOUNTER — Ambulatory Visit: Payer: BLUE CROSS/BLUE SHIELD

## 2016-10-11 ENCOUNTER — Ambulatory Visit
Admission: RE | Admit: 2016-10-11 | Discharge: 2016-10-11 | Disposition: A | Discharge status: Home / Self Care | Payer: BLUE CROSS/BLUE SHIELD | Source: Ambulatory Visit | Attending: Radiation Oncology | Admitting: Radiation Oncology

## 2016-10-11 DIAGNOSIS — C182 Malignant neoplasm of ascending colon: Secondary | ICD-10-CM | POA: Diagnosis not present

## 2016-10-12 ENCOUNTER — Ambulatory Visit
Admission: RE | Admit: 2016-10-12 | Discharge: 2016-10-12 | Disposition: A | Discharge status: Home / Self Care | Payer: BLUE CROSS/BLUE SHIELD | Source: Ambulatory Visit | Attending: Radiation Oncology | Admitting: Radiation Oncology

## 2016-10-12 DIAGNOSIS — C182 Malignant neoplasm of ascending colon: Secondary | ICD-10-CM | POA: Diagnosis not present

## 2016-10-13 ENCOUNTER — Ambulatory Visit
Admission: RE | Admit: 2016-10-13 | Discharge: 2016-10-13 | Disposition: A | Discharge status: Home / Self Care | Payer: BLUE CROSS/BLUE SHIELD | Source: Ambulatory Visit | Attending: Radiation Oncology | Admitting: Radiation Oncology

## 2016-10-13 DIAGNOSIS — C182 Malignant neoplasm of ascending colon: Secondary | ICD-10-CM | POA: Diagnosis not present

## 2016-10-27 ENCOUNTER — Inpatient Hospital Stay: Payer: BLUE CROSS/BLUE SHIELD | Attending: Internal Medicine

## 2016-10-27 DIAGNOSIS — Z95828 Presence of other vascular implants and grafts: Secondary | ICD-10-CM

## 2016-10-27 DIAGNOSIS — C182 Malignant neoplasm of ascending colon: Secondary | ICD-10-CM | POA: Diagnosis not present

## 2016-10-27 DIAGNOSIS — Z452 Encounter for adjustment and management of vascular access device: Secondary | ICD-10-CM | POA: Diagnosis not present

## 2016-10-27 MED ORDER — HEPARIN SOD (PORK) LOCK FLUSH 100 UNIT/ML IV SOLN
500.0000 [IU] | Freq: Once | INTRAVENOUS | Status: AC
  Administered 2016-10-27: 500 [IU] via INTRAVENOUS

## 2016-10-27 MED ORDER — SODIUM CHLORIDE 0.9% FLUSH
10.0000 mL | INTRAVENOUS | Status: DC | PRN
  Administered 2016-10-27: 10 mL via INTRAVENOUS
  Filled 2016-10-27: qty 10

## 2016-11-01 ENCOUNTER — Other Ambulatory Visit: Payer: Self-pay | Admitting: Internal Medicine

## 2016-11-15 ENCOUNTER — Ambulatory Visit: Payer: BLUE CROSS/BLUE SHIELD | Admitting: Radiation Oncology

## 2016-11-15 ENCOUNTER — Ambulatory Visit
Admission: RE | Admit: 2016-11-15 | Discharge: 2016-11-15 | Disposition: A | Discharge status: Home / Self Care | Payer: BLUE CROSS/BLUE SHIELD | Source: Ambulatory Visit | Attending: Radiation Oncology | Admitting: Radiation Oncology

## 2016-11-15 VITALS — BP 154/96 | HR 100 | Temp 98.4°F | Resp 18 | Wt 188.9 lb

## 2016-11-15 DIAGNOSIS — C182 Malignant neoplasm of ascending colon: Secondary | ICD-10-CM | POA: Insufficient documentation

## 2016-11-15 NOTE — Progress Notes (Signed)
Radiation Oncology Follow up Note  Name: Philip Shah   Date:   11/15/2016 MRN:  [id number] DOB: 02/11/1956    This 61 y.o. male presents to the clinic today for one-month follow-up status post adjuvant radiation therapy for stage IIIa adenocarcinoma the ascending colon.  REFERRING PROVIDER: Marden Noble, MD  HPI: Patient is a 61 year old male now out 1 month having completed adjuvant radiation therapy for stage IIIa (T4 N2 M0) adenocarcinoma the ascending colon with evidence of direct extension of tumor beyond the serosal surface.. He had a positive radial margin we gave adjuvant radiation therapy and he is seen today in routine follow-up and is doing well specifically denies diarrhea abdominal pain or discomfort. He scheduled for CT scan next week and a follow-up colonoscopy withDr. Jamal Collin in several weeks.  COMPLICATIONS OF TREATMENT: none  FOLLOW UP COMPLIANCE: keeps appointments   PHYSICAL EXAM:  BP (!) 154/96   Pulse 100   Temp 98.4 F (36.9 C)   Resp 18   Wt 188 lb 15 oz (85.7 kg)   BMI 32.43 kg/m  Well-developed well-nourished patient in NAD. HEENT reveals PERLA, EOMI, discs not visualized.  Oral cavity is clear. No oral mucosal lesions are identified. Neck is clear without evidence of cervical or supraclavicular adenopathy. Lungs are clear to A&P. Cardiac examination is essentially unremarkable with regular rate and rhythm without murmur rub or thrill. Abdomen is benign with no organomegaly or masses noted. Motor sensory and DTR levels are equal and symmetric in the upper and lower extremities. Cranial nerves II through XII are grossly intact. Proprioception is intact. No peripheral adenopathy or edema is identified. No motor or sensory levels are noted. Crude visual fields are within normal range.  RADIOLOGY RESULTS: Will review his CT scans when it become available  PLAN: Present time patient is recovering extremely well with no residual side effects or complaints  from his radiation therapy. I'm please was overall progress. I will review his CT scan and colonoscopy reports from a become available. I've asked to see him back in 4-5 months for follow-up. Patient is to call sooner with any concerns.  I would like to take this opportunity to thank you for allowing me to participate in the care of your patient.Armstead Peaks., MD

## 2016-11-22 ENCOUNTER — Inpatient Hospital Stay: Payer: BLUE CROSS/BLUE SHIELD | Attending: Internal Medicine

## 2016-11-22 ENCOUNTER — Ambulatory Visit
Admission: RE | Admit: 2016-11-22 | Discharge: 2016-11-22 | Disposition: A | Discharge status: Home / Self Care | Payer: BLUE CROSS/BLUE SHIELD | Source: Ambulatory Visit | Attending: Internal Medicine | Admitting: Internal Medicine

## 2016-11-22 DIAGNOSIS — Z452 Encounter for adjustment and management of vascular access device: Secondary | ICD-10-CM | POA: Insufficient documentation

## 2016-11-22 DIAGNOSIS — D696 Thrombocytopenia, unspecified: Secondary | ICD-10-CM | POA: Insufficient documentation

## 2016-11-22 DIAGNOSIS — D649 Anemia, unspecified: Secondary | ICD-10-CM | POA: Insufficient documentation

## 2016-11-22 DIAGNOSIS — R200 Anesthesia of skin: Secondary | ICD-10-CM | POA: Diagnosis not present

## 2016-11-22 DIAGNOSIS — Z85038 Personal history of other malignant neoplasm of large intestine: Secondary | ICD-10-CM | POA: Insufficient documentation

## 2016-11-22 DIAGNOSIS — Z808 Family history of malignant neoplasm of other organs or systems: Secondary | ICD-10-CM | POA: Insufficient documentation

## 2016-11-22 DIAGNOSIS — Z9049 Acquired absence of other specified parts of digestive tract: Secondary | ICD-10-CM | POA: Diagnosis not present

## 2016-11-22 DIAGNOSIS — N400 Enlarged prostate without lower urinary tract symptoms: Secondary | ICD-10-CM | POA: Insufficient documentation

## 2016-11-22 DIAGNOSIS — Z79899 Other long term (current) drug therapy: Secondary | ICD-10-CM | POA: Insufficient documentation

## 2016-11-22 DIAGNOSIS — R202 Paresthesia of skin: Secondary | ICD-10-CM | POA: Diagnosis not present

## 2016-11-22 DIAGNOSIS — D3501 Benign neoplasm of right adrenal gland: Secondary | ICD-10-CM | POA: Insufficient documentation

## 2016-11-22 DIAGNOSIS — E279 Disorder of adrenal gland, unspecified: Secondary | ICD-10-CM | POA: Diagnosis not present

## 2016-11-22 DIAGNOSIS — Z923 Personal history of irradiation: Secondary | ICD-10-CM | POA: Insufficient documentation

## 2016-11-22 DIAGNOSIS — K219 Gastro-esophageal reflux disease without esophagitis: Secondary | ICD-10-CM | POA: Insufficient documentation

## 2016-11-22 DIAGNOSIS — C182 Malignant neoplasm of ascending colon: Secondary | ICD-10-CM

## 2016-11-22 DIAGNOSIS — Z800 Family history of malignant neoplasm of digestive organs: Secondary | ICD-10-CM | POA: Diagnosis not present

## 2016-11-22 LAB — CBC WITH DIFFERENTIAL/PLATELET
BASOS ABS: 0.1 10*3/uL (ref 0–0.1)
Basophils Relative: 1 %
EOS ABS: 0.1 10*3/uL (ref 0–0.7)
Eosinophils Relative: 2 %
HEMATOCRIT: 48.2 % (ref 40.0–52.0)
HEMOGLOBIN: 16.8 g/dL (ref 13.0–18.0)
Lymphocytes Relative: 17 %
Lymphs Abs: 1.1 10*3/uL (ref 1.0–3.6)
MCH: 32.2 pg (ref 26.0–34.0)
MCHC: 35 g/dL (ref 32.0–36.0)
MCV: 92.1 fL (ref 80.0–100.0)
Monocytes Absolute: 0.6 10*3/uL (ref 0.2–1.0)
Monocytes Relative: 9 %
NEUTROS ABS: 4.7 10*3/uL (ref 1.4–6.5)
NEUTROS PCT: 71 %
Platelets: 132 10*3/uL — ABNORMAL LOW (ref 150–440)
RBC: 5.23 MIL/uL (ref 4.40–5.90)
RDW: 14.2 % (ref 11.5–14.5)
WBC: 6.7 10*3/uL (ref 3.8–10.6)

## 2016-11-22 LAB — BASIC METABOLIC PANEL
ANION GAP: 7 (ref 5–15)
BUN: 12 mg/dL (ref 6–20)
CALCIUM: 9.3 mg/dL (ref 8.9–10.3)
CHLORIDE: 102 mmol/L (ref 101–111)
CO2: 27 mmol/L (ref 22–32)
Creatinine, Ser: 0.83 mg/dL (ref 0.61–1.24)
GFR calc non Af Amer: >60 mL/min (ref >60)
Glucose, Bld: 120 mg/dL — ABNORMAL HIGH (ref 65–99)
Potassium: 4.3 mmol/L (ref 3.5–5.1)
SODIUM: 136 mmol/L (ref 135–145)

## 2016-11-22 MED ORDER — IOPAMIDOL (ISOVUE-300) INJECTION 61%
100.0000 mL | Freq: Once | INTRAVENOUS | Status: AC | PRN
  Administered 2016-11-22: 100 mL via INTRAVENOUS

## 2016-11-23 LAB — CEA: CEA: 1.5 ng/mL (ref 0.0–4.7)

## 2016-11-24 ENCOUNTER — Inpatient Hospital Stay (HOSPITAL_BASED_OUTPATIENT_CLINIC_OR_DEPARTMENT_OTHER): Payer: BLUE CROSS/BLUE SHIELD | Admitting: Internal Medicine

## 2016-11-24 ENCOUNTER — Inpatient Hospital Stay: Payer: BLUE CROSS/BLUE SHIELD

## 2016-11-24 VITALS — BP 134/82 | HR 79 | Temp 97.8°F | Wt 187.1 lb

## 2016-11-24 DIAGNOSIS — Z923 Personal history of irradiation: Secondary | ICD-10-CM | POA: Diagnosis not present

## 2016-11-24 DIAGNOSIS — R200 Anesthesia of skin: Secondary | ICD-10-CM | POA: Diagnosis not present

## 2016-11-24 DIAGNOSIS — Z9049 Acquired absence of other specified parts of digestive tract: Secondary | ICD-10-CM | POA: Diagnosis not present

## 2016-11-24 DIAGNOSIS — D696 Thrombocytopenia, unspecified: Secondary | ICD-10-CM

## 2016-11-24 DIAGNOSIS — Z79899 Other long term (current) drug therapy: Secondary | ICD-10-CM | POA: Diagnosis not present

## 2016-11-24 DIAGNOSIS — D3501 Benign neoplasm of right adrenal gland: Secondary | ICD-10-CM

## 2016-11-24 DIAGNOSIS — R202 Paresthesia of skin: Secondary | ICD-10-CM | POA: Diagnosis not present

## 2016-11-24 DIAGNOSIS — K219 Gastro-esophageal reflux disease without esophagitis: Secondary | ICD-10-CM

## 2016-11-24 DIAGNOSIS — Z85038 Personal history of other malignant neoplasm of large intestine: Secondary | ICD-10-CM | POA: Diagnosis not present

## 2016-11-24 DIAGNOSIS — N400 Enlarged prostate without lower urinary tract symptoms: Secondary | ICD-10-CM

## 2016-11-24 DIAGNOSIS — C182 Malignant neoplasm of ascending colon: Secondary | ICD-10-CM

## 2016-11-24 DIAGNOSIS — Z808 Family history of malignant neoplasm of other organs or systems: Secondary | ICD-10-CM

## 2016-11-24 DIAGNOSIS — D649 Anemia, unspecified: Secondary | ICD-10-CM | POA: Diagnosis not present

## 2016-11-24 DIAGNOSIS — Z452 Encounter for adjustment and management of vascular access device: Secondary | ICD-10-CM

## 2016-11-24 DIAGNOSIS — Z800 Family history of malignant neoplasm of digestive organs: Secondary | ICD-10-CM

## 2016-11-24 NOTE — Assessment & Plan Note (Addendum)
#   Ascending colon cancer; stage III  S/p  adjuvant chemotherapy with FOLFOX every 2 weeks 12 [finished Dec 2017] Given the positive margin- at surgery;s/p RT [until feb 7th]. CT-March 19th NED.  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  # Thrombocytopenia G-1- monitor for now.   # Right adrenal adenoma ~1cm/ incidental; will monitor for now.   # Discusses re: diet/exercise/ losing weight.   # port flush- discussed re: explantation; wants to keep for now.   # follow up again in 4 months; CT C/A/P/labs-  few days prior; check PSA in 2 months; port flush.    I reviewed the images myself and with the patient and family in detail.

## 2016-11-24 NOTE — Progress Notes (Signed)
Patient here today for follow up.  Patient states no new concerns today  

## 2016-11-24 NOTE — Progress Notes (Signed)
[facility] [place] NOTE  Patient Care Team: Marden Noble, MD as PCP - General (Internal Medicine) Marden Noble, MD (Internal Medicine) Christene Lye, MD (General Surgery) Cammie Sickle, MD as Consulting Physician (Internal Medicine)  CHIEF COMPLAINTS/PURPOSE OF CONSULTATION:  Oncology History   # June-July 2017-Ascending COLON CA STAGE III [pT3pN2 (4/14LN positive); POSITIVE RADIAL MARGIN; Dr.Sankar]; pre-op CEA- 1.5/N; MRI- liver-NEG; 03/26/2016- PET-NED   # June 24th 2017-  FOLFOX q 2 W  # IDA- [aug 2017] s/p IV venofer x4.    # MOLECULAR TESTING: MSI-STABLE; F-ONE-[july 2017] B- RAF V 600 E; Wild type Kras/N-ras ; others**     Cancer of ascending colon ([place])   02/26/2016 Initial Diagnosis    Cancer of ascending colon ([facility])        HISTORY OF PRESENTING ILLNESS:  Philip Shah 61 y.o.  male above history of stage III colon cancer is Currently Status post adjuvant FOLFOX; also s/p RT [finished Feb 2018]- Is here for follow-up to review the results of his restaging CAT scan.  Patient admits to continued tingling and numbness of his finger tips/feet. However this is not getting any worse. In fact it gets better after activity. He started to start any medications. Denies any diarrhea.  Denies abdominal pain. No nausea no vomiting. No bleeding. No nausea vomiting. He is participating in the exercise program. He is trying to lose weight. He is eating healthy. He is awaiting a colonoscopy in June 2018.  ROS: A complete 10 point review of system is done which is negative except mentioned above in history of present illness.   MEDICAL HISTORY:  Past Medical History:  Diagnosis Date  . Abnormal colonoscopy 02-2016  . Anemia   . Cancer ([place])    colon ca  . Enlarged prostate   . GERD (gastroesophageal reflux disease)   . H/O vasectomy 1  . Hemorrhoids   . Hx of ligation of vein 1981  . Sciatica   . Status post colon  resection 02-26-16    SURGICAL HISTORY: Past Surgical History:  Procedure Laterality Date  . APPENDECTOMY    . COLONOSCOPY WITH PROPOFOL N/A 02/11/2016   Procedure: COLONOSCOPY WITH PROPOFOL;  Surgeon: Christene Lye, MD;  Location: [facility];  Service: Endoscopy;  Laterality: N/A;  . ESOPHAGOGASTRODUODENOSCOPY (EGD) WITH PROPOFOL N/A 02/11/2016   Procedure: ESOPHAGOGASTRODUODENOSCOPY (EGD) WITH PROPOFOL;  Surgeon: Christene Lye, MD;  Location: [facility];  Service: Endoscopy;  Laterality: N/A;  . LAPAROSCOPIC RIGHT COLECTOMY Right 02/26/2016   Procedure: LAPAROSCOPIC RIGHT COLECTOMY;  Surgeon: Christene Lye, MD;  Location: [facility];  Service: General;  Laterality: Right;  . PORTACATH PLACEMENT N/A 03/18/2016   Procedure: INSERTION PORT-A-CATH;  Surgeon: Christene Lye, MD;  Location: [facility] ORS;  Service: General;  Laterality: N/A;  . VASECTOMY    . 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 Marital status: Single    Spouse name: N/A  . Number of children: N/A  . Years of education: N/A   Occupational History  . Not on file.   Social History Main Topics  . Smoking status: Never Smoker  . Smokeless tobacco: Never Used  . Alcohol use No     Comment: 3/day  . Drug use: No  . Sexual activity: Not on file  Other Topics Concern  . Not on file   Social History Narrative  . No narrative on file    FAMILY HISTORY: mat aunt- anal cancer; oldest brother/pre-cancerous polyps- [Dr. In [place]] Family History  Problem Relation Age of Onset  . Kidney failure Mother   . Cancer Father     spine    ALLERGIES:  is allergic to penicillins.  MEDICATIONS:  Current Outpatient Prescriptions  Medication Sig Dispense Refill  . b complex vitamins capsule Take 1 capsule by mouth daily.    . ferrous sulfate  (FERROUSUL) 325 (65 FE) MG tablet Take 1 tablet (325 mg total) by mouth daily with breakfast. 30 tablet 3  . lidocaine-prilocaine (EMLA) cream APPLY ONE APPLICATION TOPICALLY AS NEEDED. APPLY GENEROUSLY OVER THE MEDIPORT 45 MINUTES PRIOR TO CHEMOTHERAPY. 30 g 1  . Multiple Vitamin (MULTIVITAMIN WITH MINERALS) TABS tablet Take 1 tablet by mouth daily.    . ondansetron (ZOFRAN) 8 MG tablet Take 1 tablet (8 mg total) by mouth every 8 (eight) hours as needed for nausea or vomiting (start 3 days; after chemo). 40 tablet 1  . Saw Palmetto, Serenoa repens, (SAW PALMETTO PO) Take by mouth.     No current facility-administered medications for this visit.       Marland Kitchen  PHYSICAL EXAMINATION: ECOG PERFORMANCE STATUS: 0 - Asymptomatic  Vitals:   11/24/16 1027  BP: 134/82  Pulse: 79  Temp: 97.8 F (36.6 C)   Filed Weights   11/24/16 1027  Weight: 187 lb 2 oz (84.9 kg)    GENERAL: Well-nourished well-developed; Alert, no distress and comfortable. He is alone. EYES: no pallor or icterus OROPHARYNX: no thrush or ulceration; good dentition  NECK: supple, no masses felt LYMPH:  no palpable lymphadenopathy in the cervical, axillary or inguinal regions LUNGS: clear to auscultation and  No wheeze or crackles HEART/CVS: regular rate & rhythm and no murmurs; No lower extremity edema ABDOMEN: abdomen soft, non-tender and normal bowel sounds.  Musculoskeletal:no cyanosis of digits and no clubbing  PSYCH: alert & oriented x 3 with fluent speech NEURO: no focal motor/sensory deficits SKIN:  no rashes or significant lesions  LABORATORY DATA:  I have reviewed the data as listed Lab Results  Component Value Date   WBC 6.7 11/22/2016   HGB 16.8 11/22/2016   HCT 48.2 11/22/2016   MCV 92.1 11/22/2016   PLT 132 (L) 11/22/2016    Recent Labs  08/18/16 0948 09/01/16 0938 09/29/16 0840 11/22/16 0758  NA 136 137 137 136  K 4.0 4.0 4.3 4.3  CL 106 108 106 102  CO2 _0 GLUCOSE 133* 154* 70  120*  BUN _1 CREATININE 0.68 0.74 0.72 0.83  CALCIUM 8.9 9.0 9.1 9.3  GFRNONAA >60 >60 >60 >60  GFRAA >60 >60 >60 >60  PROT 7.3 7.3 7.3  --   ALBUMIN 3.7 3.7 4.1  --   AST 42* 37 32  --   ALT _2 --   ALKPHOS 95 102 86  --   BILITOT 0.8 0.7 0.6  --     RADIOGRAPHIC STUDIES: I have personally reviewed the radiological images as listed and agreed with the findings in the report. Ct Chest W Contrast  Result Date: 11/22/2016 CLINICAL DATA:  Colon cancer EXAM: CT CHEST, ABDOMEN, AND PELVIS WITH CONTRAST TECHNIQUE: Multidetector CT imaging of the chest, abdomen and pelvis was performed following the standard protocol during bolus administration of intravenous contrast. CONTRAST:  121m ISOVUE-300 IOPAMIDOL (ISOVUE-300) INJECTION 61% COMPARISON:  02/18/16 and 03/26/2016 FINDINGS: CT CHEST FINDINGS Cardiovascular: Mild aortic atherosclerosis. Calcification in the LAD and left circumflex and RCA coronary arteries noted. No pericardial effusion identified. Mediastinum/Nodes: No enlarged mediastinal, hilar, or axillary lymph nodes. Thyroid gland, trachea, and esophagus demonstrate no significant findings. Lungs/Pleura: No pleural fluid.  No suspicious nodule or mass. Musculoskeletal: No chest wall mass or suspicious bone lesions identified. CT ABDOMEN PELVIS FINDINGS Hepatobiliary: Calcified granuloma identified within the left lobe of liver. Gallbladder is normal. No biliary dilatation. Pancreas: Unremarkable. No pancreatic ductal dilatation or surrounding inflammatory changes. Spleen: Normal in size without focal abnormality. Adrenals/Urinary Tract: Normal appearance of the left adrenal gland. Small nodule in the right adrenal gland measures 1 cm and is unchanged from the previous exam. Likely small adenoma.Kidneys are normal, without renal calculi, focal lesion, or hydronephrosis. Bladder is unremarkable Stomach/Bowel: The stomach is normal. No pathologic dilatation of the small bowel loops.  Status post right hemicolectomy with entero colonic anastomosis. No mass identified at the anastomotic site. The colon is unremarkable. Vascular/Lymphatic: No significant vascular findings are present. No enlarged abdominal or pelvic lymph nodes. Reproductive: Prostate is unremarkable. Other: No abdominal wall hernia or abnormality. No abdominopelvic ascites. Musculoskeletal: No acute or significant osseous findings. IMPRESSION: 1. No acute findings and no evidence for metastatic disease within the chest, abdomen or pelvis. 2. Right adrenal nodule, stable.  Likely benign adenoma. Electronically Signed   By: TKerby MoorsM.D.   On: 11/22/2016 11:10   Ct Abdomen Pelvis W Contrast  Result Date: 11/22/2016 CLINICAL DATA:  Colon cancer EXAM: CT CHEST, ABDOMEN, AND PELVIS WITH CONTRAST TECHNIQUE: Multidetector CT imaging of the chest, abdomen and pelvis was performed following the standard protocol during bolus administration of intravenous contrast. CONTRAST:  1055mISOVUE-300 IOPAMIDOL (ISOVUE-300) INJECTION 61% COMPARISON:  02/18/16 and 03/26/2016 FINDINGS: CT CHEST FINDINGS Cardiovascular: Mild aortic atherosclerosis. Calcification in the LAD and left circumflex and RCA coronary arteries noted. No pericardial effusion identified. Mediastinum/Nodes: No enlarged mediastinal, hilar, or axillary lymph nodes. Thyroid gland, trachea, and esophagus demonstrate no significant findings. Lungs/Pleura: No pleural fluid.  No suspicious nodule or mass. Musculoskeletal: No chest wall mass or suspicious bone lesions identified. CT ABDOMEN PELVIS FINDINGS Hepatobiliary: Calcified granuloma identified within the left lobe of liver. Gallbladder is normal. No biliary dilatation. Pancreas: Unremarkable. No pancreatic ductal dilatation or surrounding inflammatory changes. Spleen: Normal in size without focal abnormality. Adrenals/Urinary Tract: Normal appearance of the left adrenal gland. Small nodule in the right adrenal gland  measures 1 cm and is unchanged from the previous exam. Likely small adenoma.Kidneys are normal, without renal calculi, focal lesion, or hydronephrosis. Bladder is unremarkable Stomach/Bowel: The stomach is normal. No pathologic dilatation of the small bowel loops. Status post right hemicolectomy with entero colonic anastomosis. No mass identified at the anastomotic site. The colon is unremarkable. Vascular/Lymphatic: No significant vascular findings are present. No enlarged abdominal or pelvic lymph nodes. Reproductive: Prostate is unremarkable. Other: No abdominal wall hernia or abnormality. No abdominopelvic ascites. Musculoskeletal: No acute or significant osseous findings. IMPRESSION: 1. No acute findings and no evidence for metastatic disease within the chest, abdomen or pelvis. 2. Right adrenal nodule, stable.  Likely benign adenoma. Electronically Signed   By: TaKerby Moors.D.   On: 11/22/2016 11:10    ASSESSMENT & PLAN:   [place] cancer; stage III  S/p  adjuvant chemotherapy with FOLFOX every 2 weeks 12 [finished Dec 2017] Given  the positive margin- at surgery;s/p RT [until feb 7th]. CT-March 19th NED.  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  # Thrombocytopenia G-1- monitor for now.   # Right adrenal adenoma ~1cm/ incidental; will monitor for now.   # Discusses re: diet/exercise/ losing weight.   # port flush- discussed re: explantation; wants to keep for now.   # follow up again in 4 months; CT C/A/P/labs-  few days prior; check PSA in 2 months; port flush.    I reviewed the images myself and with the patient and family in detail.     Cammie Sickle, MD 11/24/2016 1:15 PM

## 2016-11-24 NOTE — Progress Notes (Signed)
Survivorship Care Plan visit completed.  Treatment summary reviewed and given to patient.  ASCO answers booklet reviewed and given to patient.  CARE program and Cancer Transitions discussed with patient along with other resources cancer center offers to patients and caregivers.  Patient verbalized understanding.    

## 2016-12-01 ENCOUNTER — Inpatient Hospital Stay: Payer: BLUE CROSS/BLUE SHIELD

## 2016-12-01 DIAGNOSIS — Z85038 Personal history of other malignant neoplasm of large intestine: Secondary | ICD-10-CM | POA: Diagnosis not present

## 2016-12-01 DIAGNOSIS — Z95828 Presence of other vascular implants and grafts: Secondary | ICD-10-CM

## 2016-12-01 MED ORDER — HEPARIN SOD (PORK) LOCK FLUSH 100 UNIT/ML IV SOLN
500.0000 [IU] | Freq: Once | INTRAVENOUS | Status: AC
  Administered 2016-12-01: 500 [IU] via INTRAVENOUS

## 2016-12-01 MED ORDER — SODIUM CHLORIDE 0.9% FLUSH
10.0000 mL | INTRAVENOUS | Status: DC | PRN
Start: 2016-12-01 — End: 2016-12-01
  Administered 2016-12-01: 10 mL via INTRAVENOUS
  Filled 2016-12-01: qty 10

## 2017-01-14 IMAGING — DX DG CHEST 1V PORT
1 series · 1 of 1 positions shown · non-contrast
Comparison: 12/08/2011

CLINICAL DATA: Catheter placement

EXAM:
PORTABLE CHEST 1 VIEW

[chest ap]
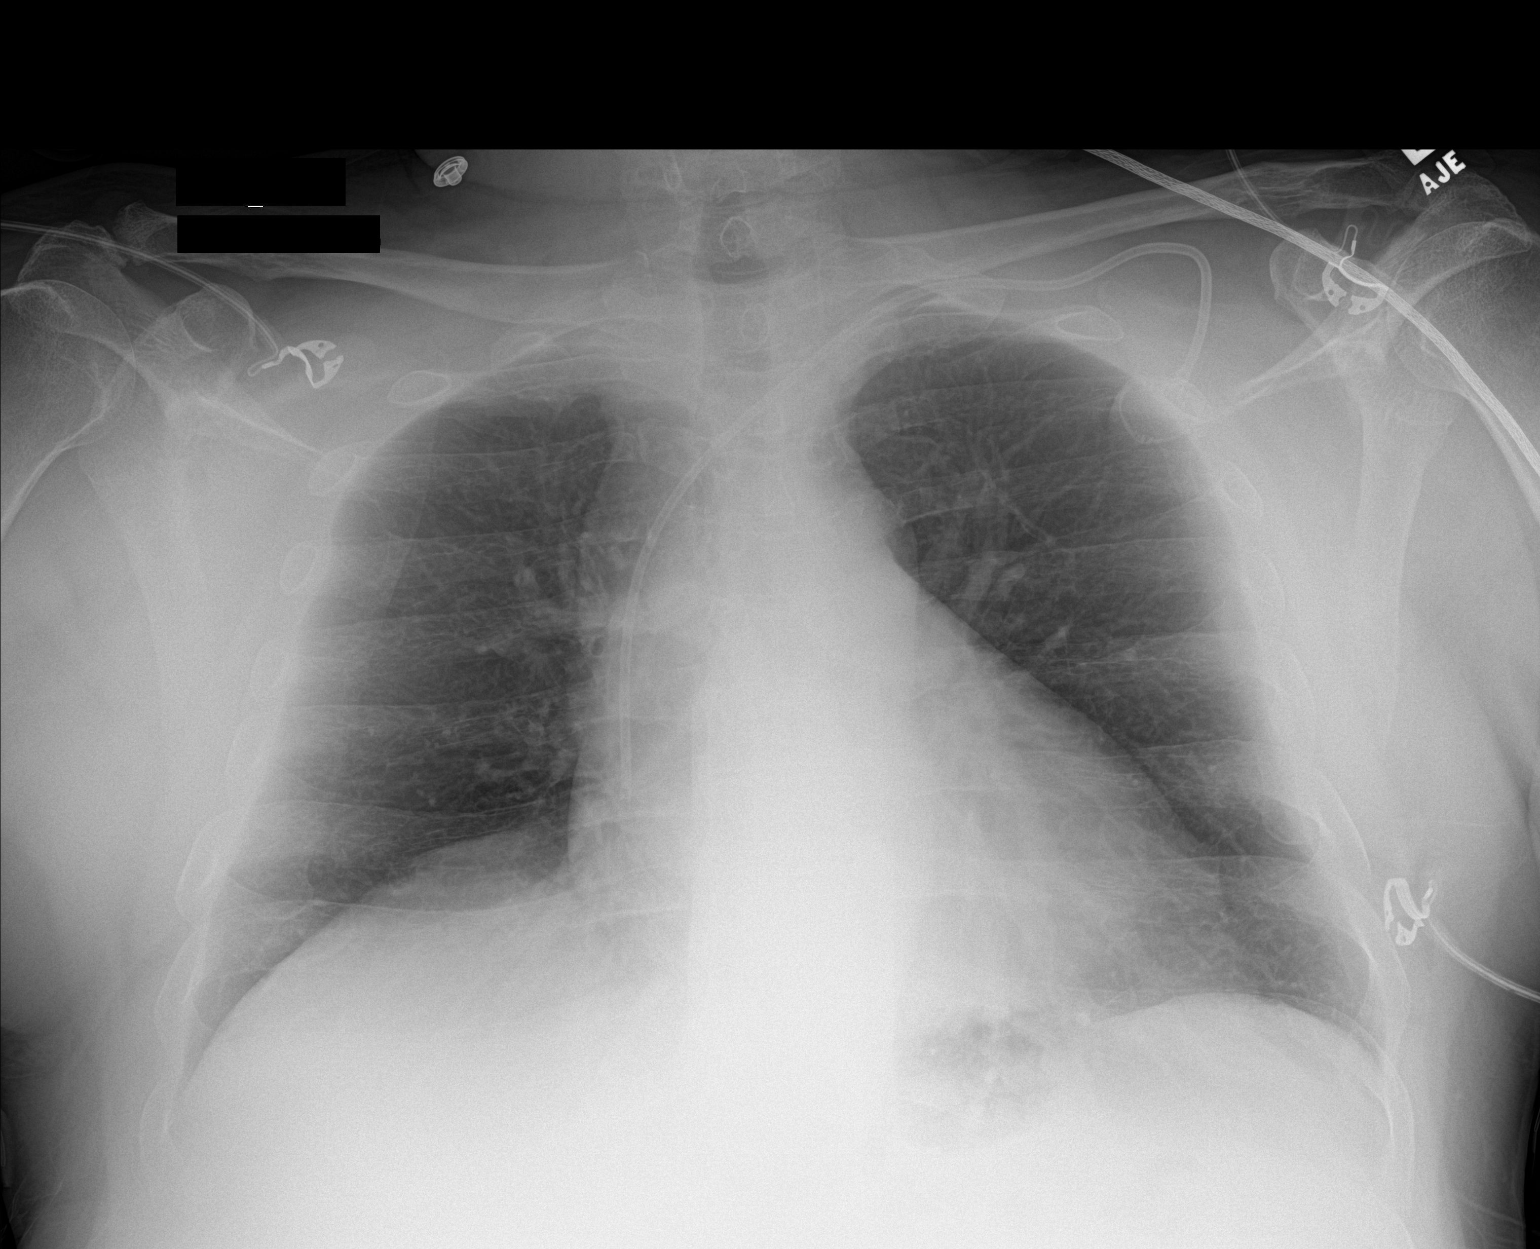

[1 of 1 positions shown; findings below may reference images not displayed]

FINDINGS: Left subclavian vein Port-A-Cath has been placed. Tip is at the
cavoatrial junction. No pneumothorax. Normal heart size. Lungs under
aerated and clear.
IMPRESSION: Left subclavian vein Port-A-Cath placed with its tip at the
cavoatrial junction and no pneumothorax.

## 2017-01-19 ENCOUNTER — Inpatient Hospital Stay: Payer: BLUE CROSS/BLUE SHIELD | Attending: Internal Medicine

## 2017-01-19 DIAGNOSIS — Z85038 Personal history of other malignant neoplasm of large intestine: Secondary | ICD-10-CM | POA: Diagnosis present

## 2017-01-19 DIAGNOSIS — Z95828 Presence of other vascular implants and grafts: Secondary | ICD-10-CM

## 2017-01-19 DIAGNOSIS — C182 Malignant neoplasm of ascending colon: Secondary | ICD-10-CM

## 2017-01-19 LAB — PSA: PSA: 2.8 ng/mL (ref 0.00–4.00)

## 2017-01-19 MED ORDER — HEPARIN SOD (PORK) LOCK FLUSH 100 UNIT/ML IV SOLN
500.0000 [IU] | Freq: Once | INTRAVENOUS | Status: AC
  Administered 2017-01-19: 500 [IU] via INTRAVENOUS

## 2017-01-19 MED ORDER — SODIUM CHLORIDE 0.9% FLUSH
10.0000 mL | INTRAVENOUS | Status: DC | PRN
Start: 2017-01-19 — End: 2017-01-19
  Administered 2017-01-19: 10 mL via INTRAVENOUS
  Filled 2017-01-19: qty 10

## 2017-02-07 ENCOUNTER — Ambulatory Visit: Payer: BLUE CROSS/BLUE SHIELD | Admitting: General Surgery

## 2017-02-09 ENCOUNTER — Encounter: Payer: Self-pay | Admitting: General Surgery

## 2017-02-09 ENCOUNTER — Ambulatory Visit (INDEPENDENT_AMBULATORY_CARE_PROVIDER_SITE_OTHER): Payer: BLUE CROSS/BLUE SHIELD | Admitting: General Surgery

## 2017-02-09 VITALS — BP 138/74 | HR 88 | Resp 12 | Ht 66.0 in | Wt 191.0 lb

## 2017-02-09 DIAGNOSIS — C182 Malignant neoplasm of ascending colon: Secondary | ICD-10-CM | POA: Diagnosis not present

## 2017-02-09 MED ORDER — POLYETHYLENE GLYCOL 3350 17 GM/SCOOP PO POWD
1.0000 | Freq: Once | ORAL | 0 refills | Status: AC
Start: 2017-02-09 — End: 2017-02-09

## 2017-02-09 NOTE — Progress Notes (Signed)
Patient ID: Philip Shah, male   DOB: 1956/03/07, 61 y.o.   MRN: [id number]  Chief Complaint  Patient presents with  . Follow-up    colon cancer    HPI Philip Shah is a 61 y.o. male. Here today for follow up colon CA/right colectomy done 02/26/16. 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 The chemotherapy was stopped sooner due to neuropathy (11 out of 12, completed all of radiation). He does admit to mild diarrhea due to working in the heat outside, not a new onset, has experienced these episodes in the past. He does go to the gym 2 days a week. He is otherwise doing very well  HPI  Past Medical History:  Diagnosis Date  . Abnormal colonoscopy 02-2016  . Anemia   . Cancer ([place])    colon ca  . Enlarged prostate   . GERD (gastroesophageal reflux disease)   . H/O vasectomy 37  . Hemorrhoids   . Hx of ligation of vein 1981  . Sciatica   . Status post colon resection 02-26-16    Past Surgical History:  Procedure Laterality Date  . APPENDECTOMY    . COLONOSCOPY WITH PROPOFOL N/A 02/11/2016   Procedure: COLONOSCOPY WITH PROPOFOL;  Surgeon: Christene Lye, MD;  Location: [facility] ENDOSCOPY;  Service: Endoscopy;  Laterality: N/A;  . ESOPHAGOGASTRODUODENOSCOPY (EGD) WITH PROPOFOL N/A 02/11/2016   Procedure: ESOPHAGOGASTRODUODENOSCOPY (EGD) WITH PROPOFOL;  Surgeon: Christene Lye, MD;  Location: [facility] ENDOSCOPY;  Service: Endoscopy;  Laterality: N/A;  . LAPAROSCOPIC RIGHT COLECTOMY Right 02/26/2016   Procedure: LAPAROSCOPIC RIGHT COLECTOMY;  Surgeon: Christene Lye, MD;  Location: [facility] ORS;  Service: General;  Laterality: Right;  . PORTACATH PLACEMENT N/A 03/18/2016   Procedure: INSERTION PORT-A-CATH;  Surgeon: Christene Lye, MD;  Location: [facility] ORS;  Service: General;  Laterality: N/A;  . VASECTOMY    . VEIN LIGATION Right 1980's   Dr Pat Kocher    Family History  Problem Relation Age of Onset  . Kidney failure Mother   .  Cancer Father        spine    Social History Social History  Substance Use Topics  . Smoking status: Never Smoker  . Smokeless tobacco: Never Used  . Alcohol use No     Comment: 3/day    Allergies  Allergen Reactions  . Penicillins Rash    Rash as a child. Has patient had a PCN reaction causing immediate rash, facial/tongue/throat swelling, SOB or lightheadedness with hypotension: No Has patient had a PCN reaction causing severe rash involving mucus membranes or skin necrosis: No Has patient had a PCN reaction that required hospitalization No Has patient had a PCN reaction occurring within the last 10 years: No If all of the above answers are "NO", then may proceed with Cephalosporin use.     Current Outpatient Prescriptions  Medication Sig Dispense Refill  . b complex vitamins capsule Take 1 capsule by mouth daily.    Marland Kitchen lidocaine-prilocaine (EMLA) cream APPLY ONE APPLICATION TOPICALLY AS NEEDED. APPLY GENEROUSLY OVER THE MEDIPORT 45 MINUTES PRIOR TO CHEMOTHERAPY. 30 Shah 1  . Multiple Vitamin (MULTIVITAMIN WITH MINERALS) TABS tablet Take 1 tablet by mouth daily.    . ondansetron (ZOFRAN) 8 MG tablet Take 1 tablet (8 mg total) by mouth every 8 (eight) hours as needed for nausea or vomiting (start 3 days; after chemo). 40 tablet 1  . Saw Palmetto, Serenoa repens, (SAW PALMETTO PO) Take by mouth.  No current facility-administered medications for this visit.     Review of Systems Review of Systems  Constitutional: Negative.   Respiratory: Negative.   Cardiovascular: Negative.   Gastrointestinal: Positive for diarrhea. Negative for blood in stool and constipation.    Blood pressure 138/74, pulse 88, resp. rate 12, height 5\' 6"  (1.676 m), weight 191 lb (86.6 kg).  Physical Exam Physical Exam  Constitutional: He is oriented to person, place, and time. He appears well-developed and well-nourished.  Eyes: Conjunctivae are normal. No scleral icterus.  Neck: Neck supple.   Cardiovascular: Normal rate, regular rhythm and normal heart sounds.   Pulmonary/Chest: Effort normal and breath sounds normal.  Abdominal: Soft. Normal appearance and bowel sounds are normal. There is no hepatomegaly. There is no tenderness. No hernia.    Lymphadenopathy:    He has no cervical adenopathy.       Right: No inguinal adenopathy present.       Left: No inguinal adenopathy present.  Neurological: He is alert and oriented to person, place, and time.  Skin: Skin is warm and dry.  Psychiatric: He has a normal mood and affect. His behavior is normal.    Data Reviewed Prior notes and images reviewed.  Assessment    Status 1 yr post right colectomy for locally advanced right colon cancer, stage III- doing well, no complaints. Recent CT scan and labs were normal Stable exam otherwise    Plan    Return for follow up in 6 months. Colonoscopy due, 1 yr post colectomy    Colonoscopy with possible biopsy/polypectomy prn: Information regarding the procedure, including its potential risks and complications (including but not limited to perforation of the bowel, which may require emergency surgery to repair, and bleeding) was verbally given to the patient. Educational information regarding lower intestinal endoscopy was given to the patient. Written instructions for how to complete the bowel prep using Miralax were provided. The importance of drinking ample fluids to avoid dehydration as a result of the prep emphasized.  HPI, Physical Exam, Assessment and Plan have been scribed under the direction and in the presence of Mckinley Jewel, MD  Karie Fetch, RN  The patient is scheduled for a Colonoscopy at [facility] on 03/01/17. They are aware to call the day before to get their arrival time. Miralax prescription has been sent into the patient's pharmacy. The patient is aware of date and instructions.  Documented by Lesly Rubenstein LPN  I have completed the exam and reviewed the above  documentation for accuracy and completeness.  I agree with the above.  Haematologist has been used and any errors in dictation or transcription are unintentional.  Emmarae Cowdery Shah. Philip Shah, M.D., F.A.C.S.   Philip Shah 02/09/2017, 9:24 AM

## 2017-02-09 NOTE — Patient Instructions (Addendum)
Colonoscopy, Adult A colonoscopy is an exam to look at the entire large intestine. During the exam, a lubricated, bendable tube is inserted into the anus and then passed into the rectum, colon, and other parts of the large intestine. A colonoscopy is often done as a part of normal colorectal screening or in response to certain symptoms, such as anemia, persistent diarrhea, abdominal pain, and blood in the stool. The exam can help screen for and diagnose medical problems, including:  Tumors.  Polyps.  Inflammation.  Areas of bleeding.  Tell a health care provider about:  Any allergies you have.  All medicines you are taking, including vitamins, herbs, eye drops, creams, and over-the-counter medicines.  Any problems you or family members have had with anesthetic medicines.  Any blood disorders you have.  Any surgeries you have had.  Any medical conditions you have.  Any problems you have had passing stool. What are the risks? Generally, this is a safe procedure. However, problems may occur, including:  Bleeding.  A tear in the intestine.  A reaction to medicines given during the exam.  Infection (rare).  What happens before the procedure? Eating and drinking restrictions Follow instructions from your health care provider about eating and drinking, which may include:  A few days before the procedure - follow a low-fiber diet. Avoid nuts, seeds, dried fruit, raw fruits, and vegetables.  1-3 days before the procedure - follow a clear liquid diet. Drink only clear liquids, such as clear broth or bouillon, black coffee or tea, clear juice, clear soft drinks or sports drinks, gelatin dessert, and popsicles. Avoid any liquids that contain red or purple dye.  On the day of the procedure - do not eat or drink anything during the 2 hours before the procedure, or within the time period that your health care provider recommends.  Bowel prep If you were prescribed an oral bowel prep  to clean out your colon:  Take it as told by your health care provider. Starting the day before your procedure, you will need to drink a large amount of medicated liquid. The liquid will cause you to have multiple loose stools until your stool is almost clear or light green.  If your skin or anus gets irritated from diarrhea, you may use these to relieve the irritation: ? Medicated wipes, such as adult wet wipes with aloe and vitamin E. ? A skin soothing-product like petroleum jelly.  If you vomit while drinking the bowel prep, take a break for up to 60 minutes and then begin the bowel prep again. If vomiting continues and you cannot take the bowel prep without vomiting, call your health care provider.  General instructions  Ask your health care provider about changing or stopping your regular medicines. This is especially important if you are taking diabetes medicines or blood thinners.  Plan to have someone take you home from the hospital or clinic. What happens during the procedure?  An IV tube may be inserted into one of your veins.  You will be given medicine to help you relax (sedative).  To reduce your risk of infection: ? Your health care team will wash or sanitize their hands. ? Your anal area will be washed with soap.  You will be asked to lie on your side with your knees bent.  Your health care provider will lubricate a long, thin, flexible tube. The tube will have a camera and a light on the end.  The tube will be inserted into your   anus.  The tube will be gently eased through your rectum and colon.  Air will be delivered into your colon to keep it open. You may feel some pressure or cramping.  The camera will be used to take images during the procedure.  A small tissue sample may be removed from your body to be examined under a microscope (biopsy). If any potential problems are found, the tissue will be sent to a lab for testing.  If small polyps are found, your  health care provider may remove them and have them checked for cancer cells.  The tube that was inserted into your anus will be slowly removed. The procedure may vary among health care providers and hospitals. What happens after the procedure?  Your blood pressure, heart rate, breathing rate, and blood oxygen level will be monitored until the medicines you were given have worn off.  Do not drive for 24 hours after the exam.  You may have a small amount of blood in your stool.  You may pass gas and have mild abdominal cramping or bloating due to the air that was used to inflate your colon during the exam.  It is up to you to get the results of your procedure. Ask your health care provider, or the department performing the procedure, when your results will be ready. This information is not intended to replace advice given to you by your health care provider. Make sure you discuss any questions you have with your health care provider. Document Released: 08/20/2000 Document Revised: 06/23/2016 Document Reviewed: 11/04/2015 Elsevier Interactive Patient Education  [employer].  The patient is scheduled for a Colonoscopy at [facility] on 03/01/17. They are aware to call the day before to get their arrival time. Miralax prescription has been sent into the patient's pharmacy. The patient is aware of date and instructions.

## 2017-02-17 ENCOUNTER — Other Ambulatory Visit: Payer: Self-pay | Admitting: General Surgery

## 2017-03-01 ENCOUNTER — Ambulatory Visit: Payer: BLUE CROSS/BLUE SHIELD | Admitting: Anesthesiology

## 2017-03-01 ENCOUNTER — Encounter
Admission: RE | Disposition: A | Discharge status: Home / Self Care | Payer: Self-pay | Source: Ambulatory Visit | Attending: General Surgery

## 2017-03-01 ENCOUNTER — Encounter: Payer: Self-pay | Admitting: *Deleted

## 2017-03-01 ENCOUNTER — Ambulatory Visit
Admission: RE | Admit: 2017-03-01 | Discharge: 2017-03-01 | Disposition: A | Discharge status: Home / Self Care | Payer: BLUE CROSS/BLUE SHIELD | Source: Ambulatory Visit | Attending: General Surgery | Admitting: General Surgery

## 2017-03-01 DIAGNOSIS — K219 Gastro-esophageal reflux disease without esophagitis: Secondary | ICD-10-CM | POA: Diagnosis not present

## 2017-03-01 DIAGNOSIS — Z9049 Acquired absence of other specified parts of digestive tract: Secondary | ICD-10-CM | POA: Diagnosis not present

## 2017-03-01 DIAGNOSIS — Z923 Personal history of irradiation: Secondary | ICD-10-CM | POA: Diagnosis not present

## 2017-03-01 DIAGNOSIS — Z85038 Personal history of other malignant neoplasm of large intestine: Secondary | ICD-10-CM | POA: Insufficient documentation

## 2017-03-01 DIAGNOSIS — Z1211 Encounter for screening for malignant neoplasm of colon: Secondary | ICD-10-CM | POA: Diagnosis not present

## 2017-03-01 DIAGNOSIS — Z9221 Personal history of antineoplastic chemotherapy: Secondary | ICD-10-CM | POA: Diagnosis not present

## 2017-03-01 HISTORY — PX: COLONOSCOPY WITH PROPOFOL: SHX5780

## 2017-03-01 SURGERY — COLONOSCOPY WITH PROPOFOL
Anesthesia: General

## 2017-03-01 MED ORDER — PROPOFOL 500 MG/50ML IV EMUL
INTRAVENOUS | Status: AC
  Filled 2017-03-01: qty 50

## 2017-03-01 MED ORDER — MIDAZOLAM HCL 2 MG/2ML IJ SOLN
INTRAMUSCULAR | Status: AC
  Filled 2017-03-01: qty 2

## 2017-03-01 MED ORDER — SODIUM CHLORIDE 0.9 % IV SOLN
INTRAVENOUS | Status: DC
  Administered 2017-03-01: 13:00:00 via INTRAVENOUS

## 2017-03-01 MED ORDER — FENTANYL CITRATE (PF) 100 MCG/2ML IJ SOLN
INTRAMUSCULAR | Status: AC
Start: 2017-03-01 — End: 2017-03-01
  Filled 2017-03-01: qty 2

## 2017-03-01 MED ORDER — PROPOFOL 500 MG/50ML IV EMUL
INTRAVENOUS | Status: DC | PRN
  Administered 2017-03-01: 140 ug/kg/min via INTRAVENOUS

## 2017-03-01 MED ORDER — MIDAZOLAM HCL 2 MG/2ML IJ SOLN
INTRAMUSCULAR | Status: DC | PRN
  Administered 2017-03-01: 2 mg via INTRAVENOUS

## 2017-03-01 MED ORDER — FENTANYL CITRATE (PF) 100 MCG/2ML IJ SOLN
INTRAMUSCULAR | Status: DC | PRN
  Administered 2017-03-01: 50 ug via INTRAVENOUS

## 2017-03-01 NOTE — Interval H&P Note (Signed)
History and Physical Interval Note:  03/01/2017 12:43 PM  Philip Shah  has presented today for surgery, with the diagnosis of CA COLON  The various methods of treatment have been discussed with the patient and family. After consideration of risks, benefits and other options for treatment, the patient has consented to  Procedure(s): COLONOSCOPY WITH PROPOFOL (N/A) as a surgical intervention .  The patient's history has been reviewed, patient examined, no change in status, stable for surgery.  I have reviewed the patient's chart and labs.  Questions were answered to the patient's satisfaction.     Mills Mitton G

## 2017-03-01 NOTE — Anesthesia Preprocedure Evaluation (Signed)
Anesthesia Evaluation  Patient identified by MRN, date of birth, ID band Patient awake    Reviewed: Allergy & Precautions, NPO status , Patient's Chart, lab work & pertinent test results  Airway Mallampati: III       Dental  (+) Teeth Intact   Pulmonary neg pulmonary ROS,           Cardiovascular Exercise Tolerance: Good  Rhythm:Regular     Neuro/Psych negative psych ROS   GI/Hepatic Neg liver ROS, GERD  Medicated,  Endo/Other  negative endocrine ROS  Renal/GU negative Renal ROS     Musculoskeletal   Abdominal (+) + obese,   Peds negative pediatric ROS (+)  Hematology  (+) anemia ,   Anesthesia Other Findings   Reproductive/Obstetrics                             Anesthesia Physical Anesthesia Plan  ASA: II  Anesthesia Plan: General   Post-op Pain Management:    Induction: Intravenous  PONV Risk Score and Plan:   Airway Management Planned: Natural Airway and Nasal Cannula  Additional Equipment:   Intra-op Plan:   Post-operative Plan:   Informed Consent: I have reviewed the patients History and Physical, chart, labs and discussed the procedure including the risks, benefits and alternatives for the proposed anesthesia with the patient or authorized representative who has indicated his/her understanding and acceptance.     Plan Discussed with: CRNA  Anesthesia Plan Comments:         Anesthesia Quick Evaluation

## 2017-03-01 NOTE — Anesthesia Postprocedure Evaluation (Signed)
Anesthesia Post Note  Patient: Philip Shah  Procedure(s) Performed: Procedure(s) (LRB): COLONOSCOPY WITH PROPOFOL (N/A)  Anesthesia Type: General     Last Vitals:  Vitals:   03/01/17 1404 03/01/17 1414  BP: 109/76 125/85  Pulse: 78 74  Resp: 13 14  Temp:      Last Pain:  Vitals:   03/01/17 1344  TempSrc: Tympanic                 VAN STAVEREN,Miesha Bachmann

## 2017-03-01 NOTE — Anesthesia Post-op Follow-up Note (Cosign Needed)
Anesthesia QCDR form completed.        

## 2017-03-01 NOTE — Transfer of Care (Signed)
Immediate Anesthesia Transfer of Care Note  Patient: Philip Shah  Procedure(s) Performed: Procedure(s): COLONOSCOPY WITH PROPOFOL (N/A)  Patient Location: PACU  Anesthesia Type:General  Level of Consciousness: awake and sedated  Airway & Oxygen Therapy: Patient Spontanous Breathing and Patient connected to nasal cannula oxygen  Post-op Assessment: Report given to RN and Post -op Vital signs reviewed and stable  Post vital signs: Reviewed and stable  Last Vitals:  Vitals:   03/01/17 1250  BP: 130/88  Pulse: 89  Resp: 16  Temp: 37.1 C    Last Pain:  Vitals:   03/01/17 1250  TempSrc: Tympanic         Complications: No apparent anesthesia complications

## 2017-03-01 NOTE — Op Note (Signed)
[facility] Patient Name: Philip Shah Procedure Date: 03/01/2017 1:13 PM MRN: [id number] Account #: [id number] Date of Birth: [birth year] Admit Type: Outpatient Age: 61 Room: [facility] ENDO ROOM 1 Gender: Male Note Status: Finalized Procedure:            Colonoscopy Indications:          High risk colon cancer surveillance: Personal history                        of colon cancer Providers:            Seeplaputhur G. Jamal Collin, MD Referring MD:         Mikeal Hawthorne. Brynda Greathouse MD, MD (Referring MD) Medicines:            Total IV Anesthesia (TIVA) Complications:        No immediate complications. Procedure:            Pre-Anesthesia Assessment:                       - Using IV propofol under the supervision of an                        anesthesiologist was determined to be medically                        necessary for this procedure based on review of the                        patient's medical history, medications, and prior                        anesthesia history.                       After obtaining informed consent, the colonoscope was                        passed under direct vision. Throughout the procedure,                        the patient's blood pressure, pulse, and oxygen                        saturations were monitored continuously. The Olympus                        PCF-H180AL colonoscope ( S#: [id number] ) was introduced                        through the anus and advanced to the the ileocolonic                        anastomosis. The colonoscopy was performed without                        difficulty. The patient tolerated the procedure well.                        The quality of the bowel preparation was excellent. Findings:      The perianal and digital rectal examinations were normal.  The entire examined colon appeared normal on direct and retroflexion       views. Impression:           - The entire examined colon is normal on  direct and                        retroflexion views.                       - No specimens collected. Recommendation:       - Discharge patient to home.                       - Resume previous diet.                       - Continue present medications.                       - Repeat colonoscopy in 3 years for surveillance. Procedure Code(s):    --- Professional ---                       [phone], Colonoscopy, flexible; diagnostic, including                        collection of specimen(s) by brushing or washing, when                        performed (separate procedure) Diagnosis Code(s):    --- Professional ---                       G38.756, Personal history of other malignant neoplasm                        of large intestine CPT copyright 2016 American Medical Association. All rights reserved. The codes documented in this report are preliminary and upon coder review may  be revised to meet current compliance requirements. Christene Lye, MD 03/01/2017 1:42:30 PM This report has been signed electronically. Number of Addenda: 0 Note Initiated On: 03/01/2017 1:13 PM Scope Withdrawal Time: 0 hours 6 minutes 7 seconds  Total Procedure Duration: 0 hours 14 minutes 17 seconds       [facility]

## 2017-03-01 NOTE — Anesthesia Procedure Notes (Signed)
Performed by: COOK-MARTIN, Royetta Probus Pre-anesthesia Checklist: Patient identified, Emergency Drugs available, Suction available, Patient being monitored and Timeout performed Patient Re-evaluated:Patient Re-evaluated prior to inductionOxygen Delivery Method: Nasal cannula Preoxygenation: Pre-oxygenation with 100% oxygen Intubation Type: IV induction Placement Confirmation: positive ETCO2 and CO2 detector       

## 2017-03-01 NOTE — H&P (View-Only) (Signed)
Patient ID: Philip Shah, male   DOB: 31-May-1956, 61 y.o.   MRN: [id number]  Chief Complaint  Patient presents with  . Follow-up    colon cancer    HPI Philip Shah is a 61 y.o. male. Here today for follow up colon CA/right colectomy done 02/26/16. 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 The chemotherapy was stopped sooner due to neuropathy (11 out of 12, completed all of radiation). He does admit to mild diarrhea due to working in the heat outside, not a new onset, has experienced these episodes in the past. He does go to the gym 2 days a week. He is otherwise doing very well  HPI  Past Medical History:  Diagnosis Date  . Abnormal colonoscopy 02-2016  . Anemia   . Cancer ([place])    colon ca  . Enlarged prostate   . GERD (gastroesophageal reflux disease)   . H/O vasectomy 25  . Hemorrhoids   . Hx of ligation of vein 1981  . Sciatica   . Status post colon resection 02-26-16    Past Surgical History:  Procedure Laterality Date  . APPENDECTOMY    . COLONOSCOPY WITH PROPOFOL N/A 02/11/2016   Procedure: COLONOSCOPY WITH PROPOFOL;  Surgeon: Christene Lye, MD;  Location: [facility];  Service: Endoscopy;  Laterality: N/A;  . ESOPHAGOGASTRODUODENOSCOPY (EGD) WITH PROPOFOL N/A 02/11/2016   Procedure: ESOPHAGOGASTRODUODENOSCOPY (EGD) WITH PROPOFOL;  Surgeon: Christene Lye, MD;  Location: [facility] END[facility];  Service: Endoscopy;  Laterality: N/A;  . LAPAROSCOPIC RIGHT COLECTOMY Right 02/26/2016   Procedure: LAPAROSCOPIC RIGHT COLECTOMY;  Surgeon: Christene Lye, MD;  Location: [facility] ORS;  Service: General;  Laterality: Right;  . PORTACATH PLACEMENT N/A 03/18/2016   Procedure: INSERTION PORT-A-CATH;  Surgeon: Christene Lye, MD;  Location: [facility] ORS;  Service: General;  Laterality: N/A;  . VASECTOMY    . VEIN LIGATION Right 1980's   Dr Pat Kocher    Family History  Problem Relation Age of Onset  . Kidney failure Mother   .  Cancer Father        spine    Social History Social History  Substance Use Topics  . Smoking status: Never Smoker  . Smokeless tobacco: Never Used  . Alcohol use No     Comment: 3/day    Allergies  Allergen Reactions  . Penicillins Rash    Rash as a child. Has patient had a PCN reaction causing immediate rash, facial/tongue/throat swelling, SOB or lightheadedness with hypotension: No Has patient had a PCN reaction causing severe rash involving mucus membranes or skin necrosis: No Has patient had a PCN reaction that required hospitalization No Has patient had a PCN reaction occurring within the last 10 years: No If all of the above answers are "NO", then may proceed with Cephalosporin use.     Current Outpatient Prescriptions  Medication Sig Dispense Refill  . b complex vitamins capsule Take 1 capsule by mouth daily.    Marland Kitchen lidocaine-prilocaine (EMLA) cream APPLY ONE APPLICATION TOPICALLY AS NEEDED. APPLY GENEROUSLY OVER THE MEDIPORT 45 MINUTES PRIOR TO CHEMOTHERAPY. 30 g 1  . Multiple Vitamin (MULTIVITAMIN WITH MINERALS) TABS tablet Take 1 tablet by mouth daily.    . ondansetron (ZOFRAN) 8 MG tablet Take 1 tablet (8 mg total) by mouth every 8 (eight) hours as needed for nausea or vomiting (start 3 days; after chemo). 40 tablet 1  . Saw Palmetto, Serenoa repens, (SAW PALMETTO PO) Take by mouth.  No current facility-administered medications for this visit.     Review of Systems Review of Systems  Constitutional: Negative.   Respiratory: Negative.   Cardiovascular: Negative.   Gastrointestinal: Positive for diarrhea. Negative for blood in stool and constipation.    Blood pressure 138/74, pulse 88, resp. rate 12, height 5\' 6"  (1.676 m), weight 191 lb (86.6 kg).  Physical Exam Physical Exam  Constitutional: He is oriented to person, place, and time. He appears well-developed and well-nourished.  Eyes: Conjunctivae are normal. No scleral icterus.  Neck: Neck supple.    Cardiovascular: Normal rate, regular rhythm and normal heart sounds.   Pulmonary/Chest: Effort normal and breath sounds normal.  Abdominal: Soft. Normal appearance and bowel sounds are normal. There is no hepatomegaly. There is no tenderness. No hernia.    Lymphadenopathy:    He has no cervical adenopathy.       Right: No inguinal adenopathy present.       Left: No inguinal adenopathy present.  Neurological: He is alert and oriented to person, place, and time.  Skin: Skin is warm and dry.  Psychiatric: He has a normal mood and affect. His behavior is normal.    Data Reviewed Prior notes and images reviewed.  Assessment    Status 1 yr post right colectomy for locally advanced right colon cancer, stage III- doing well, no complaints. Recent CT scan and labs were normal Stable exam otherwise    Plan    Return for follow up in 6 months. Colonoscopy due, 1 yr post colectomy    Colonoscopy with possible biopsy/polypectomy prn: Information regarding the procedure, including its potential risks and complications (including but not limited to perforation of the bowel, which may require emergency surgery to repair, and bleeding) was verbally given to the patient. Educational information regarding lower intestinal endoscopy was given to the patient. Written instructions for how to complete the bowel prep using Miralax were provided. The importance of drinking ample fluids to avoid dehydration as a result of the prep emphasized.  HPI, Physical Exam, Assessment and Plan have been scribed under the direction and in the presence of Mckinley Jewel, MD  Karie Fetch, RN  The patient is scheduled for a Colonoscopy at [facility] on 03/01/17. They are aware to call the day before to get their arrival time. Miralax prescription has been sent into the patient's pharmacy. The patient is aware of date and instructions.  Documented by Lesly Rubenstein LPN  I have completed the exam and reviewed the above  documentation for accuracy and completeness.  I agree with the above.  Haematologist has been used and any errors in dictation or transcription are unintentional.  Elleana Stillson G. Jamal Collin, M.D., F.A.C.S.   Junie Panning G 02/09/2017, 9:24 AM

## 2017-03-02 ENCOUNTER — Encounter: Payer: Self-pay | Admitting: General Surgery

## 2017-03-07 ENCOUNTER — Encounter: Payer: Self-pay | Admitting: Radiation Oncology

## 2017-03-07 ENCOUNTER — Ambulatory Visit
Admission: RE | Admit: 2017-03-07 | Discharge: 2017-03-07 | Disposition: A | Discharge status: Home / Self Care | Payer: BLUE CROSS/BLUE SHIELD | Source: Ambulatory Visit | Attending: Radiation Oncology | Admitting: Radiation Oncology

## 2017-03-07 VITALS — BP 155/92 | HR 87 | Temp 98.4°F | Resp 18 | Wt 194.8 lb

## 2017-03-07 DIAGNOSIS — Z923 Personal history of irradiation: Secondary | ICD-10-CM | POA: Diagnosis not present

## 2017-03-07 DIAGNOSIS — E279 Disorder of adrenal gland, unspecified: Secondary | ICD-10-CM | POA: Diagnosis not present

## 2017-03-07 DIAGNOSIS — C182 Malignant neoplasm of ascending colon: Secondary | ICD-10-CM

## 2017-03-07 DIAGNOSIS — Z85038 Personal history of other malignant neoplasm of large intestine: Secondary | ICD-10-CM | POA: Diagnosis present

## 2017-03-07 NOTE — Progress Notes (Signed)
Radiation Oncology Follow up Note  Name: Philip Shah   Date:   03/07/2017 MRN:  [id number] DOB: 1956/05/26    This 61 y.o. male presents to the clinic today for 5 month follow-up status post adjuvant radiation therapy for stage IIIa adenocarcinoma descending colon.  REFERRING PROVIDER: Marden Noble, MD  HPI: Patient is a 61 year old male now out 5 months having completed adjuvant radiation therapy for stage IIIa (T4 N2 M0) adenocarcinoma the ascending colon with evidence of direct extension of tumor beyond the serosal surface. He is seen today in routine follow-up and is doing well. He specifically denies diarrhea on a regular basis and intermittently does have some loose bowel movements which she associates with high daily temperature.. He had a CT scan of the abdomen performed in March showing no evidence for metastatic disease or local regional control. He does have a right adrenal nodule which is likely benign and is stable over time.  COMPLICATIONS OF TREATMENT: none  FOLLOW UP COMPLIANCE: keeps appointments   PHYSICAL EXAM:  BP (!) 155/92   Pulse 87   Temp 98.4 F (36.9 C)   Resp 18   Wt 194 lb 12.4 oz (88.3 kg)   BMI 31.44 kg/m  Well-developed well-nourished patient in NAD. HEENT reveals PERLA, EOMI, discs not visualized.  Oral cavity is clear. No oral mucosal lesions are identified. Neck is clear without evidence of cervical or supraclavicular adenopathy. Lungs are clear to A&P. Cardiac examination is essentially unremarkable with regular rate and rhythm without murmur rub or thrill. Abdomen is benign with no organomegaly or masses noted. Motor sensory and DTR levels are equal and symmetric in the upper and lower extremities. Cranial nerves II through XII are grossly intact. Proprioception is intact. No peripheral adenopathy or edema is identified. No motor or sensory levels are noted. Crude visual fields are within normal range.  RADIOLOGY RESULTS: Recent CT scan is  reviewed and compatible with the above-stated findings  PLAN: Present time patient is doing well with no evidence of disease. They're following some adrenal nodules which appear benign. Otherwise I'm please was overall progress. I've asked to see him back in 6 months for follow-up and then will start once your follow-up visits.  I would like to take this opportunity to thank you for allowing me to participate in the care of your patient.Armstead Peaks., MD

## 2017-03-11 ENCOUNTER — Other Ambulatory Visit: Payer: BLUE CROSS/BLUE SHIELD

## 2017-03-11 ENCOUNTER — Ambulatory Visit: Payer: BLUE CROSS/BLUE SHIELD | Admitting: Internal Medicine

## 2017-03-21 ENCOUNTER — Ambulatory Visit
Admission: RE | Admit: 2017-03-21 | Discharge: 2017-03-21 | Disposition: A | Discharge status: Home / Self Care | Payer: BLUE CROSS/BLUE SHIELD | Source: Ambulatory Visit | Attending: Internal Medicine | Admitting: Internal Medicine

## 2017-03-21 DIAGNOSIS — K760 Fatty (change of) liver, not elsewhere classified: Secondary | ICD-10-CM | POA: Insufficient documentation

## 2017-03-21 DIAGNOSIS — C182 Malignant neoplasm of ascending colon: Secondary | ICD-10-CM | POA: Diagnosis present

## 2017-03-21 DIAGNOSIS — D3501 Benign neoplasm of right adrenal gland: Secondary | ICD-10-CM | POA: Insufficient documentation

## 2017-03-21 DIAGNOSIS — N400 Enlarged prostate without lower urinary tract symptoms: Secondary | ICD-10-CM | POA: Insufficient documentation

## 2017-03-21 HISTORY — DX: Malignant neoplasm of colon, unspecified: C18.9

## 2017-03-21 LAB — POCT I-STAT CREATININE: Creatinine, Ser: 1 mg/dL (ref 0.61–1.24)

## 2017-03-21 MED ORDER — IOPAMIDOL (ISOVUE-300) INJECTION 61%
100.0000 mL | Freq: Once | INTRAVENOUS | Status: AC | PRN
  Administered 2017-03-21: 100 mL via INTRAVENOUS

## 2017-03-23 ENCOUNTER — Inpatient Hospital Stay: Payer: BLUE CROSS/BLUE SHIELD | Attending: Internal Medicine | Admitting: Internal Medicine

## 2017-03-23 ENCOUNTER — Inpatient Hospital Stay: Payer: BLUE CROSS/BLUE SHIELD

## 2017-03-23 VITALS — BP 135/84 | HR 82 | Temp 98.4°F | Resp 18 | Wt 197.2 lb

## 2017-03-23 DIAGNOSIS — D696 Thrombocytopenia, unspecified: Secondary | ICD-10-CM

## 2017-03-23 DIAGNOSIS — G629 Polyneuropathy, unspecified: Secondary | ICD-10-CM | POA: Insufficient documentation

## 2017-03-23 DIAGNOSIS — D3501 Benign neoplasm of right adrenal gland: Secondary | ICD-10-CM | POA: Insufficient documentation

## 2017-03-23 DIAGNOSIS — K760 Fatty (change of) liver, not elsewhere classified: Secondary | ICD-10-CM | POA: Diagnosis not present

## 2017-03-23 DIAGNOSIS — K219 Gastro-esophageal reflux disease without esophagitis: Secondary | ICD-10-CM | POA: Diagnosis not present

## 2017-03-23 DIAGNOSIS — Z85038 Personal history of other malignant neoplasm of large intestine: Secondary | ICD-10-CM | POA: Insufficient documentation

## 2017-03-23 DIAGNOSIS — M160 Bilateral primary osteoarthritis of hip: Secondary | ICD-10-CM

## 2017-03-23 DIAGNOSIS — Z9221 Personal history of antineoplastic chemotherapy: Secondary | ICD-10-CM | POA: Diagnosis not present

## 2017-03-23 DIAGNOSIS — Z808 Family history of malignant neoplasm of other organs or systems: Secondary | ICD-10-CM | POA: Diagnosis not present

## 2017-03-23 DIAGNOSIS — C182 Malignant neoplasm of ascending colon: Secondary | ICD-10-CM

## 2017-03-23 DIAGNOSIS — Z800 Family history of malignant neoplasm of digestive organs: Secondary | ICD-10-CM

## 2017-03-23 DIAGNOSIS — N400 Enlarged prostate without lower urinary tract symptoms: Secondary | ICD-10-CM | POA: Diagnosis not present

## 2017-03-23 DIAGNOSIS — D509 Iron deficiency anemia, unspecified: Secondary | ICD-10-CM | POA: Insufficient documentation

## 2017-03-23 DIAGNOSIS — Z923 Personal history of irradiation: Secondary | ICD-10-CM

## 2017-03-23 DIAGNOSIS — Z95828 Presence of other vascular implants and grafts: Secondary | ICD-10-CM

## 2017-03-23 LAB — CBC WITH DIFFERENTIAL/PLATELET
BASOS ABS: 0.2 10*3/uL — AB (ref 0–0.1)
Basophils Relative: 2 %
EOS PCT: 2 %
Eosinophils Absolute: 0.1 10*3/uL (ref 0–0.7)
HEMATOCRIT: 45.4 % (ref 40.0–52.0)
HEMOGLOBIN: 15.9 g/dL (ref 13.0–18.0)
LYMPHS ABS: 1.5 10*3/uL (ref 1.0–3.6)
LYMPHS PCT: 20 %
MCH: 32.1 pg (ref 26.0–34.0)
MCHC: 35 g/dL (ref 32.0–36.0)
MCV: 91.8 fL (ref 80.0–100.0)
Monocytes Absolute: 0.4 10*3/uL (ref 0.2–1.0)
Monocytes Relative: 6 %
NEUTROS ABS: 5.5 10*3/uL (ref 1.4–6.5)
NEUTROS PCT: 70 %
PLATELETS: 163 10*3/uL (ref 150–440)
RBC: 4.94 MIL/uL (ref 4.40–5.90)
RDW: 14.4 % (ref 11.5–14.5)
WBC: 7.8 10*3/uL (ref 3.8–10.6)

## 2017-03-23 LAB — COMPREHENSIVE METABOLIC PANEL
ALT: 32 U/L (ref 17–63)
ANION GAP: 6 (ref 5–15)
AST: 27 U/L (ref 15–41)
Albumin: 4.5 g/dL (ref 3.5–5.0)
Alkaline Phosphatase: 64 U/L (ref 38–126)
BILIRUBIN TOTAL: 0.8 mg/dL (ref 0.3–1.2)
BUN: 21 mg/dL — AB (ref 6–20)
CHLORIDE: 105 mmol/L (ref 101–111)
CO2: 26 mmol/L (ref 22–32)
Calcium: 9.1 mg/dL (ref 8.9–10.3)
Creatinine, Ser: 0.98 mg/dL (ref 0.61–1.24)
GFR calc Af Amer: >60 mL/min (ref >60)
Glucose, Bld: 92 mg/dL (ref 65–99)
POTASSIUM: 4.1 mmol/L (ref 3.5–5.1)
Sodium: 137 mmol/L (ref 135–145)
TOTAL PROTEIN: 7.4 g/dL (ref 6.5–8.1)

## 2017-03-23 MED ORDER — SODIUM CHLORIDE 0.9% FLUSH
10.0000 mL | INTRAVENOUS | Status: DC | PRN
  Administered 2017-03-23: 10 mL via INTRAVENOUS
  Filled 2017-03-23: qty 10

## 2017-03-23 MED ORDER — HEPARIN SOD (PORK) LOCK FLUSH 100 UNIT/ML IV SOLN
500.0000 [IU] | Freq: Once | INTRAVENOUS | Status: AC
  Administered 2017-03-23: 500 [IU] via INTRAVENOUS

## 2017-03-23 NOTE — Assessment & Plan Note (Addendum)
#   Ascending colon cancer; stage III  S/p  adjuvant chemotherapy with FOLFOX every 2 weeks 12 [finished Dec 2017] Given the positive margin- at surgery;s/p RT [finished feb 7th].CT July 10th-NED.  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 Improving  # Thrombocytopenia G-1- resolved.   # Right adrenal adenoma ~1cm/ incidental; will monitor for now.   # Discusses re: diet/exercise/ losing weight. Recommend Asprin 81/day.   # port flush- discussed re: explantation; wants to keep for now.   # follow up again in 4 months; CT C/A/P/labs. port flush- 2 months.   # I reviewed the blood work- with the patient in detail; also reviewed the imaging independently [as summarized above]; and with the patient in detail.

## 2017-03-23 NOTE — Progress Notes (Signed)
[facility] [place] CONSULT NOTE  Patient Care Team: Marden Noble, MD as PCP - General (Internal Medicine) Marden Noble, MD (Internal Medicine) Christene Lye, MD (General Surgery) Cammie Sickle, MD as Consulting Physician (Internal Medicine)  CHIEF COMPLAINTS/PURPOSE OF CONSULTATION:  Oncology History   # June-July 2017-Ascending COLON CA STAGE III [pT3pN2 (4/14LN positive); POSITIVE RADIAL MARGIN; Dr.Sankar]; pre-op CEA- 1.5/N; MRI- liver-NEG; 03/26/2016- PET-NED   # June 24th 2017-  FOLFOX q 2 W;   # IDA- [aug 2017] s/p IV venofer x4.   # colonoscopy [Dr.Sankar; June 2018; repeat summer 2021 ]   # MOLECULAR TESTING: MSI-STABLE; F-ONE-[july 2017] B- RAF V 600 E; Wild type Kras/N-ras ; others**     Cancer of ascending colon ([place])   02/26/2016 Initial Diagnosis    Cancer of ascending colon ([facility])        HISTORY OF PRESENTING ILLNESS:  Philip Shah 61 y.o.  male above history of stage III colon cancer is Currently Status post adjuvant FOLFOX; also s/p RT [finished Feb 2018]- Is here for follow-up to review the results of his restaging CAT scan.  In the interim had a colonoscopy; Dr. Jamal Collin.   Patient admits to continued tingling and numbness of his finger tips/feet- Seems to getting better. He started to start any medications. Denies any diarrhea.  Denies abdominal pain. No nausea no vomiting. No bleeding. No nausea vomiting. He is participating in the exercise program. However his gaining weight. Denies any difficulty urination.  ROS: A complete 10 point review of system is done which is negative except mentioned above in history of present illness.   MEDICAL HISTORY:  Past Medical History:  Diagnosis Date  . Abnormal colonoscopy 02-2016  . Anemia   . Colon cancer ([place])    colon ca, stage III  . Enlarged prostate   . GERD (gastroesophageal reflux disease)   . H/O vasectomy 16  . Hemorrhoids   . Hx of ligation of  vein 1981  . Sciatica   . Status post colon resection 02-26-16    SURGICAL HISTORY: Past Surgical History:  Procedure Laterality Date  . APPENDECTOMY    . COLONOSCOPY WITH PROPOFOL N/A 02/11/2016   Procedure: COLONOSCOPY WITH PROPOFOL;  Surgeon: Christene Lye, MD;  Location: [facility];  Service: Endoscopy;  Laterality: N/A;  . COLONOSCOPY WITH PROPOFOL N/A 03/01/2017   Procedure: COLONOSCOPY WITH PROPOFOL;  Surgeon: Christene Lye, MD;  Location: [facility];  Service: Endoscopy;  Laterality: N/A;  . ESOPHAGOGASTRODUODENOSCOPY (EGD) WITH PROPOFOL N/A 02/11/2016   Procedure: ESOPHAGOGASTRODUODENOSCOPY (EGD) WITH PROPOFOL;  Surgeon: Christene Lye, MD;  Location: [facility];  Service: Endoscopy;  Laterality: N/A;  . LAPAROSCOPIC RIGHT COLECTOMY Right 02/26/2016   Procedure: LAPAROSCOPIC RIGHT COLECTOMY;  Surgeon: Christene Lye, MD;  Location: [facility] ORS;  Service: General;  Laterality: Right;  . PORTACATH PLACEMENT N/A 03/18/2016   Procedure: INSERTION PORT-A-CATH;  Surgeon: Christene Lye, MD;  Location: [facility] OR[facility];  Service: General;  Laterality: N/A;  . VASECTOMY    . 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 Marital status: Single    Spouse name: N/A  . Number of children: N/A  . Years of education: N/A   Occupational History  . Not on file.   Social History Main Topics  . Smoking status:  Never Smoker  . Smokeless tobacco: Never Used  . Alcohol use No     Comment: 3/day  . Drug use: No  . Sexual activity: Not on file   Other Topics Concern  . Not on file   Social History Narrative  . No narrative on file    FAMILY HISTORY: mat aunt- anal cancer; oldest brother/pre-cancerous polyps- [Dr. In [place]] Family History  Problem Relation Age of Onset  . Kidney failure Mother   . Cancer  Father        spine    ALLERGIES:  is allergic to penicillins.  MEDICATIONS:  Current Outpatient Prescriptions  Medication Sig Dispense Refill  . b complex vitamins capsule Take 1 capsule by mouth daily.    Marland Kitchen lidocaine-prilocaine (EMLA) cream APPLY ONE APPLICATION TOPICALLY AS NEEDED. APPLY GENEROUSLY OVER THE MEDIPORT 45 MINUTES PRIOR TO CHEMOTHERAPY. 30 g 1  . Multiple Vitamin (MULTIVITAMIN WITH MINERALS) TABS tablet Take 1 tablet by mouth daily.    . Saw Palmetto, Serenoa repens, (SAW PALMETTO PO) Take by mouth.    . ondansetron (ZOFRAN) 8 MG tablet Take 1 tablet (8 mg total) by mouth every 8 (eight) hours as needed for nausea or vomiting (start 3 days; after chemo). (Patient not taking: Reported on 03/07/2017) 40 tablet 1   No current facility-administered medications for this visit.       Marland Kitchen  PHYSICAL EXAMINATION: ECOG PERFORMANCE STATUS: 0 - Asymptomatic  Vitals:   03/23/17 1327  BP: 135/84  Pulse: 82  Resp: 18  Temp: 98.4 F (36.9 C)   Filed Weights   03/23/17 1327  Weight: 197 lb 3.2 oz (89.4 kg)    GENERAL: Well-nourished well-developed; Alert, no distress and comfortable. He is alone. EYES: no pallor or icterus OROPHARYNX: no thrush or ulceration; good dentition  NECK: supple, no masses felt LYMPH:  no palpable lymphadenopathy in the cervical, axillary or inguinal regions LUNGS: clear to auscultation and  No wheeze or crackles HEART/CVS: regular rate & rhythm and no murmurs; No lower extremity edema ABDOMEN: abdomen soft, non-tender and normal bowel sounds.  Musculoskeletal:no cyanosis of digits and no clubbing  PSYCH: alert & oriented x 3 with fluent speech NEURO: no focal motor/sensory deficits SKIN:  no rashes or significant lesions  LABORATORY DATA:  I have reviewed the data as listed Lab Results  Component Value Date   WBC 7.8 03/23/2017   HGB 15.9 03/23/2017   HCT 45.4 03/23/2017   MCV 91.8 03/23/2017   PLT 163 03/23/2017    Recent Labs   09/01/16 0938 09/29/16 0840 11/22/16 0758 03/21/17 1038 03/23/17 1259  NA 137 137 136  --  137  K 4.0 4.3 4.3  --  4.1  CL 108 106 102  --  105  CO2 _0 --  26  GLUCOSE 154* 70 120*  --  92  BUN _1 --  21*  CREATININE 0.74 0.72 0.83 1.00 0.98  CALCIUM 9.0 9.1 9.3  --  9.1  GFRNONAA >60 >60 >60  --  >60  GFRAA >60 >60 >60  --  >60  PROT 7.3 7.3  --   --  7.4  ALBUMIN 3.7 4.1  --   --  4.5  AST 37 32  --   --  27  ALT 26 24  --   --  32  ALKPHOS 102 86  --   --  64  BILITOT 0.7 0.6  --   --  0.8    RADIOGRAPHIC STUDIES: I have personally reviewed the radiological images as listed and agreed with the findings in the report. Ct Chest W Contrast  Result Date: 03/21/2017 CLINICAL DATA:  Followup colon carcinoma. Previous surgery, chemotherapy, and radiation therapy. EXAM: CT CHEST, ABDOMEN, AND PELVIS WITH CONTRAST TECHNIQUE: Multidetector CT imaging of the chest, abdomen and pelvis was performed following the standard protocol during bolus administration of intravenous contrast. CONTRAST:  147m ISOVUE-300 IOPAMIDOL (ISOVUE-300) INJECTION 61% COMPARISON:  11/22/2016 FINDINGS: CT CHEST FINDINGS Cardiovascular: No acute findings. Mediastinum/Lymph Nodes: No masses or pathologically enlarged lymph nodes identified. Lungs/Pleura: No pulmonary infiltrate or mass identified. No effusion present. Musculoskeletal:  No suspicious bone lesions identified. CT ABDOMEN AND PELVIS FINDINGS Hepatobiliary: No masses identified. Moderate diffuse hepatic steatosis. Gallbladder is unremarkable. Pancreas:  No mass or inflammatory changes. Spleen:  Within normal limits in size and appearance. Adrenals/Urinary tract: Stable 1 cm benign right adrenal adenoma. No evidence of renal masses Or hydronephrosis. Unremarkable unopacified urinary bladder. Stomach/Bowel: Stable postop changes from prior right colectomy. No evidence of obstruction, inflammatory process, or abnormal fluid collections.  Vascular/Lymphatic: No pathologically enlarged lymph nodes identified. No abdominal aortic aneurysm. Reproductive:  Stable mildly enlarged prostate. Other:  None. Musculoskeletal: No suspicious bone lesions identified. Bilateral hip osteoarthritis, left side greater than right. IMPRESSION: No evidence of recurrent or metastatic carcinoma. Stable hepatic steatosis and small benign right adrenal adenoma. Stable mildly enlarged prostate. Electronically Signed   By: JEarle GellM.D.   On: 03/21/2017 13:39   Ct Abdomen Pelvis W Contrast  Result Date: 03/21/2017 CLINICAL DATA:  Followup colon carcinoma. Previous surgery, chemotherapy, and radiation therapy. EXAM: CT CHEST, ABDOMEN, AND PELVIS WITH CONTRAST TECHNIQUE: Multidetector CT imaging of the chest, abdomen and pelvis was performed following the standard protocol during bolus administration of intravenous contrast. CONTRAST:  1025mISOVUE-300 IOPAMIDOL (ISOVUE-300) INJECTION 61% COMPARISON:  11/22/2016 FINDINGS: CT CHEST FINDINGS Cardiovascular: No acute findings. Mediastinum/Lymph Nodes: No masses or pathologically enlarged lymph nodes identified. Lungs/Pleura: No pulmonary infiltrate or mass identified. No effusion present. Musculoskeletal:  No suspicious bone lesions identified. CT ABDOMEN AND PELVIS FINDINGS Hepatobiliary: No masses identified. Moderate diffuse hepatic steatosis. Gallbladder is unremarkable. Pancreas:  No mass or inflammatory changes. Spleen:  Within normal limits in size and appearance. Adrenals/Urinary tract: Stable 1 cm benign right adrenal adenoma. No evidence of renal masses Or hydronephrosis. Unremarkable unopacified urinary bladder. Stomach/Bowel: Stable postop changes from prior right colectomy. No evidence of obstruction, inflammatory process, or abnormal fluid collections. Vascular/Lymphatic: No pathologically enlarged lymph nodes identified. No abdominal aortic aneurysm. Reproductive:  Stable mildly enlarged prostate. Other:   None. Musculoskeletal: No suspicious bone lesions identified. Bilateral hip osteoarthritis, left side greater than right. IMPRESSION: No evidence of recurrent or metastatic carcinoma. Stable hepatic steatosis and small benign right adrenal adenoma. Stable mildly enlarged prostate. Electronically Signed   By: JoEarle Gell.D.   On: 03/21/2017 13:39   IMPRESSION: No evidence of recurrent or metastatic carcinoma.  Stable hepatic steatosis and small benign right adrenal adenoma.  Stable mildly enlarged prostate.   Electronically Signed   By: JoEarle Gell.D.   On: 03/21/2017 13:39 ASSESSMENT & PLAN:   [place] Ascending colon cancer; stage III  S/p  adjuvant chemotherapy with FOLFOX every 2 weeks 12 [finished Dec 2017] Given the positive margin- at surgery;s/p RT [finished feb 7th].CT July 10th-NED.  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 Improving  # Thrombocytopenia G-1- resolved.   #  Right adrenal adenoma ~1cm/ incidental; will monitor for now.   # Discusses re: diet/exercise/ losing weight. Recommend Asprin 81/day.   # port flush- discussed re: explantation; wants to keep for now.   # follow up again in 4 months; CT C/A/P/labs. port flush- 2 months.   # I reviewed the blood work- with the patient in detail; also reviewed the imaging independently [as summarized above]; and with the patient in detail.      Cammie Sickle, MD 03/23/2017 5:01 PM

## 2017-03-23 NOTE — Progress Notes (Signed)
Patient is here today for follow up. Patient has no new concerns today.

## 2017-03-24 LAB — CEA: CEA: 1.8 ng/mL (ref 0.0–4.7)

## 2017-05-04 ENCOUNTER — Inpatient Hospital Stay: Payer: BLUE CROSS/BLUE SHIELD | Attending: Internal Medicine

## 2017-05-04 DIAGNOSIS — Z452 Encounter for adjustment and management of vascular access device: Secondary | ICD-10-CM | POA: Insufficient documentation

## 2017-05-04 DIAGNOSIS — C182 Malignant neoplasm of ascending colon: Secondary | ICD-10-CM | POA: Diagnosis present

## 2017-05-04 DIAGNOSIS — Z95828 Presence of other vascular implants and grafts: Secondary | ICD-10-CM

## 2017-05-04 MED ORDER — HEPARIN SOD (PORK) LOCK FLUSH 100 UNIT/ML IV SOLN
500.0000 [IU] | Freq: Once | INTRAVENOUS | Status: AC
  Administered 2017-05-04: 500 [IU] via INTRAVENOUS

## 2017-05-04 MED ORDER — SODIUM CHLORIDE 0.9% FLUSH
10.0000 mL | INTRAVENOUS | Status: AC | PRN
  Administered 2017-05-04: 10 mL via INTRAVENOUS
  Filled 2017-05-04: qty 10

## 2017-06-15 ENCOUNTER — Inpatient Hospital Stay: Payer: BLUE CROSS/BLUE SHIELD | Attending: Internal Medicine

## 2017-06-15 DIAGNOSIS — Z452 Encounter for adjustment and management of vascular access device: Secondary | ICD-10-CM | POA: Insufficient documentation

## 2017-06-15 DIAGNOSIS — Z85038 Personal history of other malignant neoplasm of large intestine: Secondary | ICD-10-CM | POA: Diagnosis present

## 2017-06-15 DIAGNOSIS — C801 Malignant (primary) neoplasm, unspecified: Secondary | ICD-10-CM

## 2017-06-15 MED ORDER — SODIUM CHLORIDE 0.9% FLUSH
10.0000 mL | INTRAVENOUS | Status: DC | PRN
  Administered 2017-06-15: 10 mL via INTRAVENOUS
  Filled 2017-06-15: qty 10

## 2017-06-15 MED ORDER — HEPARIN SOD (PORK) LOCK FLUSH 100 UNIT/ML IV SOLN
500.0000 [IU] | Freq: Once | INTRAVENOUS | Status: AC
  Administered 2017-06-15: 500 [IU] via INTRAVENOUS

## 2017-07-14 ENCOUNTER — Inpatient Hospital Stay: Payer: BLUE CROSS/BLUE SHIELD | Attending: Internal Medicine

## 2017-07-14 ENCOUNTER — Ambulatory Visit
Admission: RE | Admit: 2017-07-14 | Discharge: 2017-07-14 | Disposition: A | Discharge status: Home / Self Care | Payer: BLUE CROSS/BLUE SHIELD | Source: Ambulatory Visit | Attending: Internal Medicine | Admitting: Internal Medicine

## 2017-07-14 DIAGNOSIS — I2510 Atherosclerotic heart disease of native coronary artery without angina pectoris: Secondary | ICD-10-CM | POA: Diagnosis not present

## 2017-07-14 DIAGNOSIS — Z85038 Personal history of other malignant neoplasm of large intestine: Secondary | ICD-10-CM | POA: Insufficient documentation

## 2017-07-14 DIAGNOSIS — Z923 Personal history of irradiation: Secondary | ICD-10-CM | POA: Diagnosis not present

## 2017-07-14 DIAGNOSIS — I700 Atherosclerosis of aorta: Secondary | ICD-10-CM | POA: Insufficient documentation

## 2017-07-14 DIAGNOSIS — Z9049 Acquired absence of other specified parts of digestive tract: Secondary | ICD-10-CM | POA: Insufficient documentation

## 2017-07-14 DIAGNOSIS — D3501 Benign neoplasm of right adrenal gland: Secondary | ICD-10-CM | POA: Insufficient documentation

## 2017-07-14 DIAGNOSIS — Z9221 Personal history of antineoplastic chemotherapy: Secondary | ICD-10-CM | POA: Insufficient documentation

## 2017-07-14 DIAGNOSIS — Z79899 Other long term (current) drug therapy: Secondary | ICD-10-CM | POA: Diagnosis not present

## 2017-07-14 DIAGNOSIS — K760 Fatty (change of) liver, not elsewhere classified: Secondary | ICD-10-CM | POA: Insufficient documentation

## 2017-07-14 DIAGNOSIS — I10 Essential (primary) hypertension: Secondary | ICD-10-CM | POA: Insufficient documentation

## 2017-07-14 DIAGNOSIS — K219 Gastro-esophageal reflux disease without esophagitis: Secondary | ICD-10-CM | POA: Insufficient documentation

## 2017-07-14 DIAGNOSIS — K649 Unspecified hemorrhoids: Secondary | ICD-10-CM | POA: Insufficient documentation

## 2017-07-14 DIAGNOSIS — M5430 Sciatica, unspecified side: Secondary | ICD-10-CM | POA: Diagnosis not present

## 2017-07-14 DIAGNOSIS — C182 Malignant neoplasm of ascending colon: Secondary | ICD-10-CM | POA: Diagnosis not present

## 2017-07-14 DIAGNOSIS — D509 Iron deficiency anemia, unspecified: Secondary | ICD-10-CM | POA: Insufficient documentation

## 2017-07-14 DIAGNOSIS — G629 Polyneuropathy, unspecified: Secondary | ICD-10-CM | POA: Insufficient documentation

## 2017-07-14 DIAGNOSIS — K6389 Other specified diseases of intestine: Secondary | ICD-10-CM | POA: Insufficient documentation

## 2017-07-14 DIAGNOSIS — N400 Enlarged prostate without lower urinary tract symptoms: Secondary | ICD-10-CM | POA: Insufficient documentation

## 2017-07-14 DIAGNOSIS — Z95828 Presence of other vascular implants and grafts: Secondary | ICD-10-CM

## 2017-07-14 LAB — CBC WITH DIFFERENTIAL/PLATELET
BASOS ABS: 0 10*3/uL (ref 0–0.1)
Basophils Relative: 0 %
Eosinophils Absolute: 0.2 10*3/uL (ref 0–0.7)
Eosinophils Relative: 2 %
HEMATOCRIT: 49.4 % (ref 40.0–52.0)
Hemoglobin: 17 g/dL (ref 13.0–18.0)
LYMPHS ABS: 1.8 10*3/uL (ref 1.0–3.6)
Lymphocytes Relative: 22 %
MCH: 32.7 pg (ref 26.0–34.0)
MCHC: 34.4 g/dL (ref 32.0–36.0)
MCV: 95 fL (ref 80.0–100.0)
MONOS PCT: 9 %
Monocytes Absolute: 0.8 10*3/uL (ref 0.2–1.0)
NEUTROS ABS: 5.6 10*3/uL (ref 1.4–6.5)
Neutrophils Relative %: 67 %
Platelets: 173 10*3/uL (ref 150–440)
RBC: 5.2 MIL/uL (ref 4.40–5.90)
RDW: 13.9 % (ref 11.5–14.5)
WBC: 8.4 10*3/uL (ref 3.8–10.6)

## 2017-07-14 LAB — COMPREHENSIVE METABOLIC PANEL
ALBUMIN: 4.4 g/dL (ref 3.5–5.0)
ALT: 65 U/L — AB (ref 17–63)
AST: 53 U/L — AB (ref 15–41)
Alkaline Phosphatase: 65 U/L (ref 38–126)
Anion gap: 7 (ref 5–15)
BILIRUBIN TOTAL: 0.9 mg/dL (ref 0.3–1.2)
BUN: 14 mg/dL (ref 6–20)
CO2: 25 mmol/L (ref 22–32)
Calcium: 9.1 mg/dL (ref 8.9–10.3)
Chloride: 104 mmol/L (ref 101–111)
Creatinine, Ser: 0.83 mg/dL (ref 0.61–1.24)
GFR calc Af Amer: >60 mL/min (ref >60)
GFR calc non Af Amer: >60 mL/min (ref >60)
GLUCOSE: 113 mg/dL — AB (ref 65–99)
POTASSIUM: 4.7 mmol/L (ref 3.5–5.1)
Sodium: 136 mmol/L (ref 135–145)
TOTAL PROTEIN: 7.7 g/dL (ref 6.5–8.1)

## 2017-07-14 LAB — POCT I-STAT CREATININE: Creatinine, Ser: 0.9 mg/dL (ref 0.61–1.24)

## 2017-07-14 MED ORDER — IOPAMIDOL (ISOVUE-300) INJECTION 61%
100.0000 mL | Freq: Once | INTRAVENOUS | Status: AC | PRN
  Administered 2017-07-14: 100 mL via INTRAVENOUS

## 2017-07-14 MED ORDER — HEPARIN SOD (PORK) LOCK FLUSH 100 UNIT/ML IV SOLN
500.0000 [IU] | Freq: Once | INTRAVENOUS | Status: AC
  Administered 2017-07-14: 500 [IU] via INTRAVENOUS

## 2017-07-14 MED ORDER — SODIUM CHLORIDE 0.9% FLUSH
10.0000 mL | INTRAVENOUS | Status: DC | PRN
  Administered 2017-07-14: 10 mL via INTRAVENOUS
  Filled 2017-07-14: qty 10

## 2017-07-15 ENCOUNTER — Inpatient Hospital Stay: Payer: BLUE CROSS/BLUE SHIELD | Admitting: Internal Medicine

## 2017-07-15 ENCOUNTER — Encounter: Payer: Self-pay | Admitting: Internal Medicine

## 2017-07-15 VITALS — BP 144/88 | HR 115 | Temp 98.1°F | Resp 16 | Wt 204.6 lb

## 2017-07-15 DIAGNOSIS — C182 Malignant neoplasm of ascending colon: Secondary | ICD-10-CM

## 2017-07-15 DIAGNOSIS — I700 Atherosclerosis of aorta: Secondary | ICD-10-CM

## 2017-07-15 DIAGNOSIS — K219 Gastro-esophageal reflux disease without esophagitis: Secondary | ICD-10-CM

## 2017-07-15 DIAGNOSIS — I10 Essential (primary) hypertension: Secondary | ICD-10-CM | POA: Diagnosis not present

## 2017-07-15 DIAGNOSIS — D3501 Benign neoplasm of right adrenal gland: Secondary | ICD-10-CM | POA: Diagnosis not present

## 2017-07-15 DIAGNOSIS — M5430 Sciatica, unspecified side: Secondary | ICD-10-CM | POA: Diagnosis not present

## 2017-07-15 DIAGNOSIS — Z79899 Other long term (current) drug therapy: Secondary | ICD-10-CM

## 2017-07-15 DIAGNOSIS — I2510 Atherosclerotic heart disease of native coronary artery without angina pectoris: Secondary | ICD-10-CM | POA: Diagnosis not present

## 2017-07-15 DIAGNOSIS — G629 Polyneuropathy, unspecified: Secondary | ICD-10-CM | POA: Diagnosis not present

## 2017-07-15 DIAGNOSIS — K760 Fatty (change of) liver, not elsewhere classified: Secondary | ICD-10-CM | POA: Diagnosis not present

## 2017-07-15 DIAGNOSIS — Z9221 Personal history of antineoplastic chemotherapy: Secondary | ICD-10-CM

## 2017-07-15 DIAGNOSIS — Z923 Personal history of irradiation: Secondary | ICD-10-CM

## 2017-07-15 DIAGNOSIS — K649 Unspecified hemorrhoids: Secondary | ICD-10-CM

## 2017-07-15 DIAGNOSIS — Z85038 Personal history of other malignant neoplasm of large intestine: Secondary | ICD-10-CM

## 2017-07-15 DIAGNOSIS — D509 Iron deficiency anemia, unspecified: Secondary | ICD-10-CM

## 2017-07-15 DIAGNOSIS — Z9049 Acquired absence of other specified parts of digestive tract: Secondary | ICD-10-CM

## 2017-07-15 DIAGNOSIS — N400 Enlarged prostate without lower urinary tract symptoms: Secondary | ICD-10-CM

## 2017-07-15 LAB — CEA: CEA1: 1.5 ng/mL (ref 0.0–4.7)

## 2017-07-15 NOTE — Progress Notes (Signed)
[facility] CONSULT NOTE  Patient Care Team: Marden Noble, MD as PCP - General (Internal Medicine) Marden Noble, MD (Internal Medicine) Christene Lye, MD (General Surgery) Cammie Sickle, MD as Consulting Physician (Internal Medicine)  CHIEF COMPLAINTS/PURPOSE OF CONSULTATION:  Oncology History   # June-July 2017-Ascending COLON CA STAGE III [pT3pN2 (4/14LN positive); POSITIVE RADIAL MARGIN; Dr.Sankar]; pre-op CEA- 1.5/N; MRI- liver-NEG; 03/26/2016- PET-NED   # June 24th 2017-  FOLFOX q 2 W; s/p RT [? Positive margin- finished feb 2018]  # IDA- [aug 2017] s/p IV venofer x4.   # colonoscopy [Dr.Sankar; June 2018; repeat summer 2021 ]   # MOLECULAR TESTING: MSI-STABLE; F-ONE-[july 2017] B- RAF V 600 E; Wild type Kras/N-ras ; others**     Cancer of ascending colon ([place])     HISTORY OF PRESENTING ILLNESS:  Philip Shah 61 y.o.  male above history of stage III colon cancer is Currently Status post adjuvant FOLFOX; also s/p RT [finished Feb 2018]- Is here for follow-up to review the results of his restaging CAT scan.  In the interim patient had a colonoscopy in June 2018.  Overall he is doing well.  However he has not been exercising/states that he has been under a lot of stress at work.  Gaining weight.   He otherwise denies abdominal pain. No nausea no vomiting. No bleeding. No nausea vomiting. Denies any difficulty urination.  ROS: A complete 10 point review of system is done which is negative except mentioned above in history of present illness.   MEDICAL HISTORY:  Past Medical History:  Diagnosis Date  . Abnormal colonoscopy 02-2016  . Anemia   . Colon cancer ([place])    colon ca, stage III  . Enlarged prostate   . GERD (gastroesophageal reflux disease)   . H/O vasectomy 59  . Hemorrhoids   . Hx of ligation of vein 1981  . Sciatica   . Status post colon resection 02-26-16    SURGICAL HISTORY: Past Surgical  History:  Procedure Laterality Date  . APPENDECTOMY    . VASECTOMY    . 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  Social History   Socioeconomic History  . Marital status: Single    Spouse name: Not on file  . Number of children: Not on file  . Years of education: Not on file  . Highest education level: Not on file  Social Needs  . Financial resource strain: Not on file  . Food insecurity - worry: Not on file  . Food insecurity - inability: Not on file  . Transportation needs - medical: Not on file  . Transportation needs - non-medical: Not on file  Occupational History  . Not on file  Tobacco Use  . Smoking status: Never Smoker  . Smokeless tobacco: Never Used  Substance and Sexual Activity  . Alcohol use: No    Alcohol/week: 0.0 oz    Comment: 3/day  . Drug use: No  . Sexual activity: Not on file  Other Topics Concern  . Not on file  Social History Narrative  . Not on file    FAMILY HISTORY: mat aunt- anal cancer; oldest brother/pre-cancerous polyps- [Dr. In [place]] Family History  Problem Relation Age of Onset  . Kidney failure Mother   . Cancer Father        spine  ALLERGIES:  is allergic to penicillins.  MEDICATIONS:  Current Outpatient Medications  Medication Sig Dispense Refill  . b complex vitamins capsule Take 1 capsule by mouth daily.    Marland Kitchen lidocaine-prilocaine (EMLA) cream APPLY ONE APPLICATION TOPICALLY AS NEEDED. APPLY GENEROUSLY OVER THE MEDIPORT 45 MINUTES PRIOR TO CHEMOTHERAPY. 30 g 1  . Multiple Vitamin (MULTIVITAMIN WITH MINERALS) TABS tablet Take 1 tablet by mouth daily.    . ondansetron (ZOFRAN) 8 MG tablet Take 1 tablet (8 mg total) by mouth every 8 (eight) hours as needed for nausea or vomiting (start 3 days; after chemo). 40 tablet 1  . Saw Palmetto, Serenoa repens, (SAW PALMETTO PO) Take by mouth.     No current  facility-administered medications for this visit.    Facility-Administered Medications Ordered in Other Visits  Medication Dose Route Frequency Provider Last Rate Last Dose  . sodium chloride flush (NS) 0.9 % injection 10 mL  10 mL Intravenous PRN Cammie Sickle, MD   10 mL at 05/04/17 0903      .  PHYSICAL EXAMINATION: ECOG PERFORMANCE STATUS: 0 - Asymptomatic  Vitals:   07/15/17 1404 07/15/17 1430  BP: (!) 159/93 (!) 144/88  Pulse: (!) 115   Resp: 16   Temp: 98.1 F (36.7 C)    Filed Weights   07/15/17 1404  Weight: 204 lb 9.6 oz (92.8 kg)    GENERAL: Well-nourished well-developed; Alert, no distress and comfortable. He is alone. EYES: no pallor or icterus OROPHARYNX: no thrush or ulceration; good dentition  NECK: supple, no masses felt LYMPH:  no palpable lymphadenopathy in the cervical, axillary or inguinal regions LUNGS: clear to auscultation and  No wheeze or crackles HEART/CVS: regular rate & rhythm and no murmurs; No lower extremity edema ABDOMEN: abdomen soft, non-tender and normal bowel sounds.  Musculoskeletal:no cyanosis of digits and no clubbing  PSYCH: alert & oriented x 3 with fluent speech NEURO: no focal motor/sensory deficits SKIN:  no rashes or significant lesions  LABORATORY DATA:  I have reviewed the data as listed Lab Results  Component Value Date   WBC 8.4 07/14/2017   HGB 17.0 07/14/2017   HCT 49.4 07/14/2017   MCV 95.0 07/14/2017   PLT 173 07/14/2017   Recent Labs    09/29/16 0840 11/22/16 0758  03/23/17 1259 07/14/17 0835 07/14/17 0901  NA 137 136  --  137 136  --   K 4.3 4.3  --  4.1 4.7  --   CL 106 102  --  105 104  --   CO2 27 27  --  26 25  --   GLUCOSE 70 120*  --  92 113*  --   BUN 14 12  --  21* 14  --   CREATININE 0.72 0.83   < > 0.98 0.83 0.90  CALCIUM 9.1 9.3  --  9.1 9.1  --   GFRNONAA >60 >60  --  >60 >60  --   GFRAA >60 >60  --  >60 >60  --   PROT 7.3  --   --  7.4 7.7  --   ALBUMIN 4.1  --   --  4.5 4.4   --   AST 32  --   --  27 53*  --   ALT 24  --   --  32 65*  --   ALKPHOS 86  --   --  64 65  --   BILITOT 0.6  --   --  0.8  0.9  --    < > = values in this interval not displayed.    RADIOGRAPHIC STUDIES: I have personally reviewed the radiological images as listed and agreed with the findings in the report. Ct Chest W Contrast  Result Date: 07/14/2017 CLINICAL DATA:  Ascending colon cancer diagnosed June 2017, stage III, status post right colectomy 02/26/2016 with chemotherapy and radiation therapy. Restaging. EXAM: CT CHEST, ABDOMEN, AND PELVIS WITH CONTRAST TECHNIQUE: Multidetector CT imaging of the chest, abdomen and pelvis was performed following the standard protocol during bolus administration of intravenous contrast. CONTRAST:  169m ISOVUE-300 IOPAMIDOL (ISOVUE-300) INJECTION 61% COMPARISON:  03/21/2017 CT chest, abdomen and pelvis. FINDINGS: CT CHEST FINDINGS Cardiovascular: Normal heart size. No significant pericardial fluid/thickening. Left anterior descending, left circumflex and right coronary atherosclerosis. Left subclavian MediPort terminates in the lower third of the superior vena cava. Mildly atherosclerotic nonaneurysmal thoracic aorta. Normal caliber pulmonary arteries. No central pulmonary emboli. Mediastinum/Nodes: No discrete thyroid nodules. Unremarkable esophagus. No pathologically enlarged axillary, mediastinal or hilar lymph nodes. Lungs/Pleura: No pneumothorax. No pleural effusion. No acute consolidative airspace disease, lung masses or significant pulmonary nodules. Musculoskeletal: No aggressive appearing focal osseous lesions. Moderate thoracic spondylosis. CT ABDOMEN PELVIS FINDINGS Hepatobiliary: Normal liver size. Stable coarse left liver lobe calcification from prior granulomatous disease. Diffuse hepatic steatosis. No liver mass. Normal gallbladder with no radiopaque cholelithiasis. No biliary ductal dilatation. Pancreas: Normal, with no mass or duct dilation.  Spleen: Normal size spleen. Stable granulomatous calcification in the inferior spleen. No splenic mass. Adrenals/Urinary Tract: Stable 1.0 cm right adrenal nodule back to 03/17/2016 MRI, where it was seen to represent a benign adenoma. No new adrenal nodules. Normal kidneys with no hydronephrosis and no renal mass. Normal bladder. Stomach/Bowel: Grossly normal stomach. Stable postsurgical changes from right hemicolectomy with intact appearing ileocolic anastomosis in the right abdomen. No anastomotic wall thickening or mass. Normal caliber small bowel with no small bowel wall thickening. No wall thickening or significant pericolonic fat stranding in the remnant large bowel. Oral contrast transits to the rectum. Vascular/Lymphatic: Mildly atherosclerotic nonaneurysmal abdominal aorta. Patent portal, splenic, hepatic and renal veins. No pathologically enlarged lymph nodes in the abdomen or pelvis. Reproductive: Mildly enlarged prostate with nonspecific internal prostatic calcifications, stable. Other: No pneumoperitoneum, ascites or focal fluid collection. Vasectomy clips are noted bilaterally in the upper scrotum. Musculoskeletal: No aggressive appearing focal osseous lesions. Moderate lumbar spondylosis. IMPRESSION: 1. No evidence of local tumor recurrence at the ileocolic anastomosis in the right abdomen. 2. No evidence of metastatic disease in the chest, abdomen or pelvis. 3. Chronic findings include: Aortic Atherosclerosis (ICD10-I70.0). Coronary atherosclerosis. Diffuse hepatic steatosis. Stable right adrenal adenoma. Mild prostatomegaly. Electronically Signed   By: JIlona SorrelM.D.   On: 07/14/2017 09:38   Ct Abdomen Pelvis W Contrast  Result Date: 07/14/2017 CLINICAL DATA:  Ascending colon cancer diagnosed June 2017, stage III, status post right colectomy 02/26/2016 with chemotherapy and radiation therapy. Restaging. EXAM: CT CHEST, ABDOMEN, AND PELVIS WITH CONTRAST TECHNIQUE: Multidetector CT imaging of  the chest, abdomen and pelvis was performed following the standard protocol during bolus administration of intravenous contrast. CONTRAST:  1067mISOVUE-300 IOPAMIDOL (ISOVUE-300) INJECTION 61% COMPARISON:  03/21/2017 CT chest, abdomen and pelvis. FINDINGS: CT CHEST FINDINGS Cardiovascular: Normal heart size. No significant pericardial fluid/thickening. Left anterior descending, left circumflex and right coronary atherosclerosis. Left subclavian MediPort terminates in the lower third of the superior vena cava. Mildly atherosclerotic nonaneurysmal thoracic aorta. Normal caliber pulmonary arteries. No central pulmonary emboli. Mediastinum/Nodes: No discrete  thyroid nodules. Unremarkable esophagus. No pathologically enlarged axillary, mediastinal or hilar lymph nodes. Lungs/Pleura: No pneumothorax. No pleural effusion. No acute consolidative airspace disease, lung masses or significant pulmonary nodules. Musculoskeletal: No aggressive appearing focal osseous lesions. Moderate thoracic spondylosis. CT ABDOMEN PELVIS FINDINGS Hepatobiliary: Normal liver size. Stable coarse left liver lobe calcification from prior granulomatous disease. Diffuse hepatic steatosis. No liver mass. Normal gallbladder with no radiopaque cholelithiasis. No biliary ductal dilatation. Pancreas: Normal, with no mass or duct dilation. Spleen: Normal size spleen. Stable granulomatous calcification in the inferior spleen. No splenic mass. Adrenals/Urinary Tract: Stable 1.0 cm right adrenal nodule back to 03/17/2016 MRI, where it was seen to represent a benign adenoma. No new adrenal nodules. Normal kidneys with no hydronephrosis and no renal mass. Normal bladder. Stomach/Bowel: Grossly normal stomach. Stable postsurgical changes from right hemicolectomy with intact appearing ileocolic anastomosis in the right abdomen. No anastomotic wall thickening or mass. Normal caliber small bowel with no small bowel wall thickening. No wall thickening or  significant pericolonic fat stranding in the remnant large bowel. Oral contrast transits to the rectum. Vascular/Lymphatic: Mildly atherosclerotic nonaneurysmal abdominal aorta. Patent portal, splenic, hepatic and renal veins. No pathologically enlarged lymph nodes in the abdomen or pelvis. Reproductive: Mildly enlarged prostate with nonspecific internal prostatic calcifications, stable. Other: No pneumoperitoneum, ascites or focal fluid collection. Vasectomy clips are noted bilaterally in the upper scrotum. Musculoskeletal: No aggressive appearing focal osseous lesions. Moderate lumbar spondylosis. IMPRESSION: 1. No evidence of local tumor recurrence at the ileocolic anastomosis in the right abdomen. 2. No evidence of metastatic disease in the chest, abdomen or pelvis. 3. Chronic findings include: Aortic Atherosclerosis (ICD10-I70.0). Coronary atherosclerosis. Diffuse hepatic steatosis. Stable right adrenal adenoma. Mild prostatomegaly. Electronically Signed   By: Ilona Sorrel M.D.   On: 07/14/2017 09:38   IMPRESSION: No evidence of recurrent or metastatic carcinoma.  Stable hepatic steatosis and small benign right adrenal adenoma.  Stable mildly enlarged prostate.   Electronically Signed   By: Earle Gell M.D.   On: 03/21/2017 13:39 ASSESSMENT & PLAN:   Cancer of ascending colon ([place]) # Ascending colon cancer; stage III  S/p  adjuvant chemotherapy with FOLFOX every 2 weeks 12 [finished Dec 2017] Given the positive margin- at surgery;s/p RT [finished feb 7th]. CT C/A/P- NED-NOV 2018.  #Clinically no evidence of recurrence; CT scan CEA normal.  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 Improving  # Elevated Blood pressure-recommend checking blood pressure at home frequent basis.  If continues to be elevated to inform PCP  # fatty liver- AST/ALT-G-1; monitor   # Right adrenal adenoma ~1cm/ incidental; will monitor for now.   # Discusses re: diet/exercise/ losing  weight. Recommend Asprin 81/day.   # Mild enlarged prostate cancer- PSA- normal; [may 6579]  # port flush- discussed re: explantation; will keep it for now.  # follow up again in 2 months/port flush;  Labs-CEA.  # I reviewed the blood work- with the patient in detail; also reviewed the imaging independently [as summarized above]; and with the patient in detail.       Cammie Sickle, MD 07/19/2017 6:36 PM

## 2017-07-15 NOTE — Assessment & Plan Note (Addendum)
#   Ascending colon cancer; stage III  S/p  adjuvant chemotherapy with FOLFOX every 2 weeks 12 [finished Dec 2017] Given the positive margin- at surgery;s/p RT [finished feb 7th]. CT C/A/P- NED-NOV 2018.  #Clinically no evidence of recurrence; CT scan CEA normal.  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 Improving  # Elevated Blood pressure-recommend checking blood pressure at home frequent basis.  If continues to be elevated to inform PCP  # fatty liver- AST/ALT-G-1; monitor   # Right adrenal adenoma ~1cm/ incidental; will monitor for now.   # Discusses re: diet/exercise/ losing weight. Recommend Asprin 81/day.   # Mild enlarged prostate cancer- PSA- normal; [may 6295]  # port flush- discussed re: explantation; will keep it for now.  # follow up again in 2 months/port flush;  Labs-CEA.  # I reviewed the blood work- with the patient in detail; also reviewed the imaging independently [as summarized above]; and with the patient in detail.

## 2017-08-08 ENCOUNTER — Ambulatory Visit: Payer: BLUE CROSS/BLUE SHIELD | Admitting: General Surgery

## 2017-08-08 ENCOUNTER — Encounter: Payer: Self-pay | Admitting: General Surgery

## 2017-08-08 VITALS — BP 132/74 | HR 70 | Resp 12 | Ht 72.0 in | Wt 223.0 lb

## 2017-08-08 DIAGNOSIS — C182 Malignant neoplasm of ascending colon: Secondary | ICD-10-CM

## 2017-08-08 NOTE — Patient Instructions (Signed)
Patient to return in three years colonoscopy with Dr. Bary Castilla .The patient is aware to call back for any questions or concerns.

## 2017-08-08 NOTE — Progress Notes (Signed)
Patient ID: Philip Shah, male   DOB: 1956-02-11, 61 y.o.   MRN: [id number]  Chief Complaint  Patient presents with  . Follow-up    HPI Philip Shah is a 61 y.o. male here today for his follow up right colon cancer. Moves his bowels daily. Last colonoscopy 03/01/2017. Patient want to know when he can have his port removed.  HPI  Past Medical History:  Diagnosis Date  . Abnormal colonoscopy 02-2016  . Anemia   . Colon cancer ([place])    colon ca, stage III  . Enlarged prostate   . GERD (gastroesophageal reflux disease)   . H/O vasectomy 20  . Hemorrhoids   . Hx of ligation of vein 1981  . Sciatica   . Status post colon resection 02-26-16    Past Surgical History:  Procedure Laterality Date  . APPENDECTOMY    . COLONOSCOPY WITH PROPOFOL N/A 02/11/2016   Procedure: COLONOSCOPY WITH PROPOFOL;  Surgeon: Christene Lye, MD;  Location: [facility] END[facility]COPY;  Service: Endoscopy;  Laterality: N/A;  . COLONOSCOPY WITH PROPOFOL N/A 03/01/2017   Procedure: COLONOSCOPY WITH PROPOFOL;  Surgeon: Christene Lye, MD;  Location: [facility] ENDOSCOPY;  Service: Endoscopy;  Laterality: N/A;  . ESOPHAGOGASTRODUODENOSCOPY (EGD) WITH PROPOFOL N/A 02/11/2016   Procedure: ESOPHAGOGASTRODUODENOSCOPY (EGD) WITH PROPOFOL;  Surgeon: Christene Lye, MD;  Location: [facility] ENDOSCOPY;  Service: Endoscopy;  Laterality: N/A;  . LAPAROSCOPIC RIGHT COLECTOMY Right 02/26/2016   Procedure: LAPAROSCOPIC RIGHT COLECTOMY;  Surgeon: Christene Lye, MD;  Location: [facility] ORS;  Service: General;  Laterality: Right;  . PORTACATH PLACEMENT N/A 03/18/2016   Procedure: INSERTION PORT-A-CATH;  Surgeon: Christene Lye, MD;  Location: [facility] ORS;  Service: General;  Laterality: N/A;  . VASECTOMY    . VEIN LIGATION Right 1980's   Dr Pat Kocher    Family History  Problem Relation Age of Onset  . Kidney failure Mother   . Cancer Father        spine    Social History Social History   Tobacco Use   . Smoking status: Never Smoker  . Smokeless tobacco: Never Used  Substance Use Topics  . Alcohol use: No    Alcohol/week: 0.0 oz    Comment: 3/day  . Drug use: No    Allergies  Allergen Reactions  . Penicillins Rash    Rash as a child. Has patient had a PCN reaction causing immediate rash, facial/tongue/throat swelling, SOB or lightheadedness with hypotension: No Has patient had a PCN reaction causing severe rash involving mucus membranes or skin necrosis: No Has patient had a PCN reaction that required hospitalization No Has patient had a PCN reaction occurring within the last 10 years: No If all of the above answers are "NO", then may proceed with Cephalosporin use.     Current Outpatient Medications  Medication Sig Dispense Refill  . b complex vitamins capsule Take 1 capsule by mouth daily.    Marland Kitchen lidocaine-prilocaine (EMLA) cream APPLY ONE APPLICATION TOPICALLY AS NEEDED. APPLY GENEROUSLY OVER THE MEDIPORT 45 MINUTES PRIOR TO CHEMOTHERAPY. 30 g 1  . Multiple Vitamin (MULTIVITAMIN WITH MINERALS) TABS tablet Take 1 tablet by mouth daily.    . ondansetron (ZOFRAN) 8 MG tablet Take 1 tablet (8 mg total) by mouth every 8 (eight) hours as needed for nausea or vomiting (start 3 days; after chemo). 40 tablet 1  . Saw Palmetto, Serenoa repens, (SAW PALMETTO PO) Take by mouth.     No current facility-administered medications for this visit.  Facility-Administered Medications Ordered in Other Visits  Medication Dose Route Frequency Provider Last Rate Last Dose  . sodium chloride flush (NS) 0.9 % injection 10 mL  10 mL Intravenous PRN Cammie Sickle, MD   10 mL at 05/04/17 6606    Review of Systems Review of Systems  Constitutional: Negative.   Respiratory: Negative.   Cardiovascular: Negative.   Gastrointestinal: Negative.   Genitourinary: Negative.     Blood pressure 132/74, pulse 70, resp. rate 12, height 6' (1.829 m), weight 223 lb (101.2 kg).  Physical  Exam Physical Exam  Constitutional: He is oriented to person, place, and time. He appears well-developed and well-nourished.  Eyes: Conjunctivae are normal. No scleral icterus.  Neck: Neck supple.  Cardiovascular: Normal rate, regular rhythm and normal heart sounds.  Pulmonary/Chest: Effort normal and breath sounds normal.  Abdominal: Soft. Normal appearance and bowel sounds are normal. There is no hepatomegaly. There is no tenderness. No hernia.  Lymphadenopathy:    He has no cervical adenopathy.  Neurological: He is alert and oriented to person, place, and time.  Skin: Skin is warm and dry.    Data Reviewed Prior notes reviewed   Assessment    CA right colon,s/p rt colectomy, chemo completed and radiation    Plan    Patient to return in three years colonoscopy with Dr. Bary Castilla .The patient is aware to call back for any questions or concerns.    HPI, Physical Exam, Assessment and Plan have been scribed under the direction and in the presence of Mckinley Jewel, MD  Gaspar Cola, CMA  I have completed the exam and reviewed the above documentation for accuracy and completeness.  I agree with the above.  Haematologist has been used and any errors in dictation or transcription are unintentional.  Ethaniel Garfield G. Jamal Collin, M.D., F.A.C.S.  Junie Panning G 08/10/2017, 4:01 PM

## 2017-09-07 ENCOUNTER — Inpatient Hospital Stay: Payer: BLUE CROSS/BLUE SHIELD | Admitting: Internal Medicine

## 2017-09-07 ENCOUNTER — Inpatient Hospital Stay: Payer: BLUE CROSS/BLUE SHIELD | Attending: Internal Medicine

## 2017-09-07 ENCOUNTER — Other Ambulatory Visit: Payer: Self-pay

## 2017-09-07 VITALS — BP 134/74 | HR 70 | Temp 97.2°F | Resp 20 | Ht 72.0 in | Wt 224.0 lb

## 2017-09-07 DIAGNOSIS — Z79899 Other long term (current) drug therapy: Secondary | ICD-10-CM | POA: Insufficient documentation

## 2017-09-07 DIAGNOSIS — G629 Polyneuropathy, unspecified: Secondary | ICD-10-CM | POA: Diagnosis not present

## 2017-09-07 DIAGNOSIS — Z923 Personal history of irradiation: Secondary | ICD-10-CM | POA: Insufficient documentation

## 2017-09-07 DIAGNOSIS — K219 Gastro-esophageal reflux disease without esophagitis: Secondary | ICD-10-CM | POA: Diagnosis not present

## 2017-09-07 DIAGNOSIS — R635 Abnormal weight gain: Secondary | ICD-10-CM | POA: Diagnosis not present

## 2017-09-07 DIAGNOSIS — Z85038 Personal history of other malignant neoplasm of large intestine: Secondary | ICD-10-CM | POA: Insufficient documentation

## 2017-09-07 DIAGNOSIS — N400 Enlarged prostate without lower urinary tract symptoms: Secondary | ICD-10-CM

## 2017-09-07 DIAGNOSIS — D649 Anemia, unspecified: Secondary | ICD-10-CM | POA: Diagnosis not present

## 2017-09-07 DIAGNOSIS — Z9221 Personal history of antineoplastic chemotherapy: Secondary | ICD-10-CM | POA: Diagnosis not present

## 2017-09-07 DIAGNOSIS — C182 Malignant neoplasm of ascending colon: Secondary | ICD-10-CM

## 2017-09-07 DIAGNOSIS — K760 Fatty (change of) liver, not elsewhere classified: Secondary | ICD-10-CM | POA: Diagnosis not present

## 2017-09-07 DIAGNOSIS — Z800 Family history of malignant neoplasm of digestive organs: Secondary | ICD-10-CM

## 2017-09-07 DIAGNOSIS — D3501 Benign neoplasm of right adrenal gland: Secondary | ICD-10-CM | POA: Diagnosis not present

## 2017-09-07 DIAGNOSIS — Z9889 Other specified postprocedural states: Secondary | ICD-10-CM | POA: Diagnosis not present

## 2017-09-07 DIAGNOSIS — Z9181 History of falling: Secondary | ICD-10-CM | POA: Diagnosis not present

## 2017-09-07 DIAGNOSIS — Z95828 Presence of other vascular implants and grafts: Secondary | ICD-10-CM

## 2017-09-07 LAB — CBC WITH DIFFERENTIAL/PLATELET
BASOS ABS: 0 10*3/uL (ref 0–0.1)
Basophils Relative: 0 %
EOS ABS: 0.1 10*3/uL (ref 0–0.7)
Eosinophils Relative: 1 %
HEMATOCRIT: 47.7 % (ref 40.0–52.0)
Hemoglobin: 16.4 g/dL (ref 13.0–18.0)
LYMPHS ABS: 1.8 10*3/uL (ref 1.0–3.6)
Lymphocytes Relative: 20 %
MCH: 32.4 pg (ref 26.0–34.0)
MCHC: 34.3 g/dL (ref 32.0–36.0)
MCV: 94.5 fL (ref 80.0–100.0)
MONO ABS: 0.8 10*3/uL (ref 0.2–1.0)
Monocytes Relative: 9 %
NEUTROS ABS: 6.5 10*3/uL (ref 1.4–6.5)
Neutrophils Relative %: 70 %
PLATELETS: 182 10*3/uL (ref 150–440)
RBC: 5.05 MIL/uL (ref 4.40–5.90)
RDW: 13.5 % (ref 11.5–14.5)
Smear Review: ADEQUATE
WBC: 9.2 10*3/uL (ref 3.8–10.6)

## 2017-09-07 LAB — COMPREHENSIVE METABOLIC PANEL
ALBUMIN: 4.2 g/dL (ref 3.5–5.0)
ALK PHOS: 82 U/L (ref 38–126)
ALT: 57 U/L (ref 17–63)
AST: 39 U/L (ref 15–41)
Anion gap: 8 (ref 5–15)
BILIRUBIN TOTAL: 0.6 mg/dL (ref 0.3–1.2)
BUN: 14 mg/dL (ref 6–20)
CALCIUM: 9 mg/dL (ref 8.9–10.3)
CO2: 24 mmol/L (ref 22–32)
CREATININE: 0.83 mg/dL (ref 0.61–1.24)
Chloride: 104 mmol/L (ref 101–111)
GFR calc Af Amer: >60 mL/min (ref >60)
GLUCOSE: 90 mg/dL (ref 65–99)
POTASSIUM: 4.5 mmol/L (ref 3.5–5.1)
Sodium: 136 mmol/L (ref 135–145)
TOTAL PROTEIN: 7.3 g/dL (ref 6.5–8.1)

## 2017-09-07 MED ORDER — HEPARIN SOD (PORK) LOCK FLUSH 100 UNIT/ML IV SOLN
500.0000 [IU] | INTRAVENOUS | Status: AC | PRN
  Administered 2017-09-07: 500 [IU]

## 2017-09-07 MED ORDER — SODIUM CHLORIDE 0.9% FLUSH
10.0000 mL | INTRAVENOUS | Status: AC | PRN
  Administered 2017-09-07: 10 mL
  Filled 2017-09-07: qty 10

## 2017-09-07 NOTE — Assessment & Plan Note (Addendum)
#   Ascending colon cancer; stage III  S/p  adjuvant chemotherapy with FOLFOX every 2 weeks 12 [finished Dec 2017] Given the positive margin- at surgery;s/p RT [finished feb 7th]. CT C/A/P- NED-NOV 2018.  # Clinically no evidence of recurrence;  CEA normal.  Would recommend surveillance imaging again in 3 months.   #Peripheral neuropathy grade 1; discussed regarding use of Neurontin/acupuncture; wants to wait for now.  # fatty liver- AST/ALT-G-1; resolved.  # Right adrenal adenoma ~1cm/ incidental; will monitor for now.   #Fall-multifactorial question neuropathy new shoes vision issues.  Recommend close monitoring.  #Gained about 20 pounds in the last 2 months.  Discussed re: diet/exercise/ losing weight.  Continue asprin 81/day.   # Mild enlarged prostate cancer- PSA- normal; [may 4580]  # port flush- discussed re: explantation; will keep it for now.  # port flush in 6 weeks;  # follow up in 3 months/CT scan few days prior; labs/port flush  # 25 minutes face-to-face with the patient discussing the above plan of care; more than 50% of time spent on prognosis/ natural history; counseling and coordination.

## 2017-09-07 NOTE — Progress Notes (Signed)
[facility] [place] NOTE  Patient Care Team: Albina Billet, MD as PCP - General (Internal Medicine) Marden Noble, MD (Internal Medicine) Christene Lye, MD (General Surgery) Cammie Sickle, MD as Consulting Physician (Internal Medicine)  CHIEF COMPLAINTS/PURPOSE OF CONSULTATION:  Oncology History   # June-July 2017-Ascending COLON CA STAGE III [pT3pN2 (4/14LN positive); POSITIVE RADIAL MARGIN; Dr.Sankar]; pre-op CEA- 1.5/N; MRI- liver-NEG; 03/26/2016- PET-NED   # June 24th 2017-  FOLFOX q 2 W; s/p RT [? Positive margin- finished feb 2018]  # IDA- [aug 2017] s/p IV venofer x4.   # colonoscopy [Dr.Sankar; June 2018; repeat summer 2021 ]   # MOLECULAR TESTING: MSI-STABLE; F-ONE-[july 2017] B- RAF V 600 E; Wild type Kras/N-ras ; others**     Cancer of ascending colon ([place])     HISTORY OF PRESENTING ILLNESS:  Philip Shah 62 y.o.  male above history of stage III colon cancer is Currently Status post adjuvant FOLFOX; also s/p RT [finished Feb 2018]-here for follow-up.  Patient states that he fell approximately 2 weeks ago.   He attributes that to his new shoes poor vision and also neuropathy.  In the interim patient had a colonoscopy in June 2018.  Overall he is doing well.  However he has not been exercising/states that he has been under a lot of stress at work.  Gaining weight-which he attributes to dietary indiscretion.  ROS: A complete 10 point review of system is done which is negative except mentioned above in history of present illness.   MEDICAL HISTORY:  Past Medical History:  Diagnosis Date  . Abnormal colonoscopy 02-2016  . Anemia   . Colon cancer ([place])    colon ca, stage III  . Enlarged prostate   . GERD (gastroesophageal reflux disease)   . H/O vasectomy 63  . Hemorrhoids   . Hx of ligation of vein 1981  . Sciatica   . Status post colon resection 02-26-16    SURGICAL HISTORY: Past Surgical History:   Procedure Laterality Date  . APPENDECTOMY    . COLONOSCOPY WITH PROPOFOL N/A 02/11/2016   Procedure: COLONOSCOPY WITH PROPOFOL;  Surgeon: Christene Lye, MD;  Location: [facility] ENDOSCOPY;  Service: Endoscopy;  Laterality: N/A;  . COLONOSCOPY WITH PROPOFOL N/A 03/01/2017   Procedure: COLONOSCOPY WITH PROPOFOL;  Surgeon: Christene Lye, MD;  Location: [facility] ENDOSCOPY;  Service: Endoscopy;  Laterality: N/A;  . ESOPHAGOGASTRODUODENOSCOPY (EGD) WITH PROPOFOL N/A 02/11/2016   Procedure: ESOPHAGOGASTRODUODENOSCOPY (EGD) WITH PROPOFOL;  Surgeon: Christene Lye, MD;  Location: [facility] ENDOSCOPY;  Service: Endoscopy;  Laterality: N/A;  . LAPAROSCOPIC RIGHT COLECTOMY Right 02/26/2016   Procedure: LAPAROSCOPIC RIGHT COLECTOMY;  Surgeon: Christene Lye, MD;  Location: [facility] ORS;  Service: General;  Laterality: Right;  . PORTACATH PLACEMENT N/A 03/18/2016   Procedure: INSERTION PORT-A-CATH;  Surgeon: Christene Lye, MD;  Location: [facility] ORS;  Service: General;  Laterality: N/A;  . VASECTOMY    . 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  Social History   Socioeconomic History  . Marital status: Single    Spouse name: Not on file  . Number of children: Not on file  . Years of education: Not on file  . Highest education level: Not on file  Social Needs  . Financial resource strain: Not on file  . Food insecurity - worry: Not on file  .  Food insecurity - inability: Not on file  . Transportation needs - medical: Not on file  . Transportation needs - non-medical: Not on file  Occupational History  . Not on file  Tobacco Use  . Smoking status: Never Smoker  . Smokeless tobacco: Never Used  Substance and Sexual Activity  . Alcohol use: No    Alcohol/week: 0.0 oz    Comment: 3/day  . Drug use: No  . Sexual activity: Not on file  Other Topics Concern  . Not on file   Social History Narrative  . Not on file    FAMILY HISTORY: mat aunt- anal cancer; oldest brother/pre-cancerous polyps- [Dr. In [place]] Family History  Problem Relation Age of Onset  . Kidney failure Mother   . Cancer Father        spine    ALLERGIES:  is allergic to penicillins.  MEDICATIONS:  Current Outpatient Medications  Medication Sig Dispense Refill  . b complex vitamins capsule Take 1 capsule by mouth daily.    . Multiple Vitamin (MULTIVITAMIN WITH MINERALS) TABS tablet Take 1 tablet by mouth daily.    . Saw Palmetto, Serenoa repens, (SAW PALMETTO PO) Take 1 capsule by mouth daily.     Marland Kitchen zinc gluconate 50 MG tablet Take 50 mg by mouth daily.    Marland Kitchen lidocaine-prilocaine (EMLA) cream APPLY ONE APPLICATION TOPICALLY AS NEEDED. APPLY GENEROUSLY OVER THE MEDIPORT 45 MINUTES PRIOR TO CHEMOTHERAPY. (Patient not taking: Reported on 09/07/2017) 30 g 1  . ondansetron (ZOFRAN) 8 MG tablet Take 1 tablet (8 mg total) by mouth every 8 (eight) hours as needed for nausea or vomiting (start 3 days; after chemo). (Patient not taking: Reported on 09/07/2017) 40 tablet 1   No current facility-administered medications for this visit.    Facility-Administered Medications Ordered in Other Visits  Medication Dose Route Frequency Provider Last Rate Last Dose  . sodium chloride flush (NS) 0.9 % injection 10 mL  10 mL Intravenous PRN Cammie Sickle, MD   10 mL at 05/04/17 0903      .  PHYSICAL EXAMINATION: ECOG PERFORMANCE STATUS: 0 - Asymptomatic  Vitals:   09/07/17 1447  BP: 134/74  Pulse: 70  Resp: 20  Temp: (!) 97.2 F (36.2 C)   Filed Weights   09/07/17 1447  Weight: 224 lb (101.6 kg)    GENERAL: Well-nourished well-developed; Alert, no distress and comfortable. He is alone. EYES: no pallor or icterus OROPHARYNX: no thrush or ulceration; good dentition  NECK: supple, no masses felt LYMPH:  no palpable lymphadenopathy in the cervical, axillary or inguinal regions LUNGS:  clear to auscultation and  No wheeze or crackles HEART/CVS: regular rate & rhythm and no murmurs; No lower extremity edema ABDOMEN: abdomen soft, non-tender and normal bowel sounds.  Musculoskeletal:no cyanosis of digits and no clubbing  PSYCH: alert & oriented x 3 with fluent speech NEURO: no focal motor/sensory deficits SKIN:  no rashes or significant lesions  LABORATORY DATA:  I have reviewed the data as listed Lab Results  Component Value Date   WBC 9.2 09/07/2017   HGB 16.4 09/07/2017   HCT 47.7 09/07/2017   MCV 94.5 09/07/2017   PLT 182 09/07/2017   Recent Labs    03/23/17 1259 07/14/17 0835 07/14/17 0901 09/07/17 1424  NA 137 136  --  136  K 4.1 4.7  --  4.5  CL 105 104  --  104  CO2 26 25  --  24  GLUCOSE 92 113*  --  90  BUN 21* 14  --  14  CREATININE 0.98 0.83 0.90 0.83  CALCIUM 9.1 9.1  --  9.0  GFRNONAA >60 >60  --  >60  GFRAA >60 >60  --  >60  PROT 7.4 7.7  --  7.3  ALBUMIN 4.5 4.4  --  4.2  AST 27 53*  --  39  ALT 32 65*  --  57  ALKPHOS 64 65  --  82  BILITOT 0.8 0.9  --  0.6    RADIOGRAPHIC STUDIES: I have personally reviewed the radiological images as listed and agreed with the findings in the report. No results found. IMPRESSION: No evidence of recurrent or metastatic carcinoma.  Stable hepatic steatosis and small benign right adrenal adenoma.  Stable mildly enlarged prostate.   Electronically Signed   By: Earle Gell M.D.   On: 03/21/2017 13:39 ASSESSMENT & PLAN:   Cancer of ascending colon ([place]) # Ascending colon cancer; stage III  S/p  adjuvant chemotherapy with FOLFOX every 2 weeks 12 [finished Dec 2017] Given the positive margin- at surgery;s/p RT [finished feb 7th]. CT C/A/P- NED-NOV 2018.  # Clinically no evidence of recurrence;  CEA normal.  Would recommend surveillance imaging again in 3 months.   #Peripheral neuropathy grade 1; discussed regarding use of Neurontin/acupuncture; wants to wait for now.  # fatty liver-  AST/ALT-G-1; resolved.  # Right adrenal adenoma ~1cm/ incidental; will monitor for now.   #Fall-multifactorial question neuropathy new shoes vision issues.  Recommend close monitoring.  #Gained about 20 pounds in the last 2 months.  Discussed re: diet/exercise/ losing weight.  Continue asprin 81/day.   # Mild enlarged prostate cancer- PSA- normal; [may 9791]  # port flush- discussed re: explantation; will keep it for now.  # port flush in 6 weeks;  # follow up in 3 months/CT scan few days prior; labs/port flush     Cammie Sickle, MD 09/07/2017 3:27 PM

## 2017-09-08 LAB — CEA: CEA1: 1.4 ng/mL (ref 0.0–4.7)

## 2017-09-20 IMAGING — CT CT ABD-PELV W/ CM
2 of 5 series · 13 of 36 positions shown, 16 images · IV contrast (iopamidol)
Comparison: 02/18/16 and 03/26/2016

CLINICAL DATA: Colon cancer

EXAM:
CT CHEST, ABDOMEN, AND PELVIS WITH CONTRAST
TECHNIQUE: Multidetector CT imaging of the chest, abdomen and pelvis was
performed following the standard protocol during bolus
administration of intravenous contrast.
CONTRAST:  100mL 9IORS4-HTT IOPAMIDOL (9IORS4-HTT) INJECTION 61%

[Series 2: cap with · axial · 0.78mm/px · z∈[-738,-233]mm · 10 of 125 slices shown, 13 images]
[im 12/125  mediastinal]
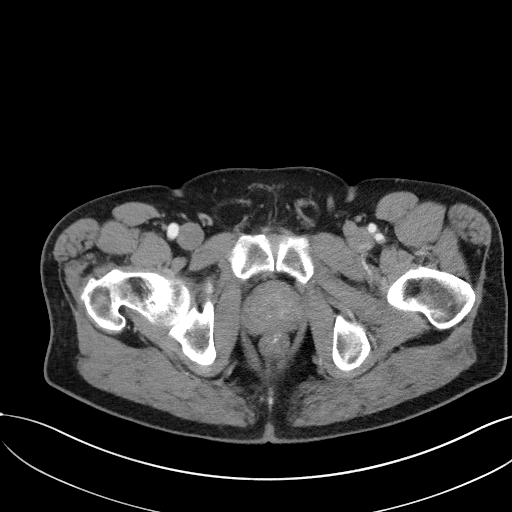
[im 12/125  lung]
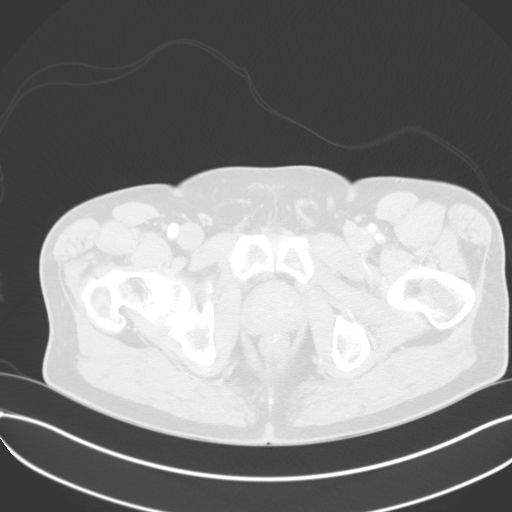
[im 23/125  lung]
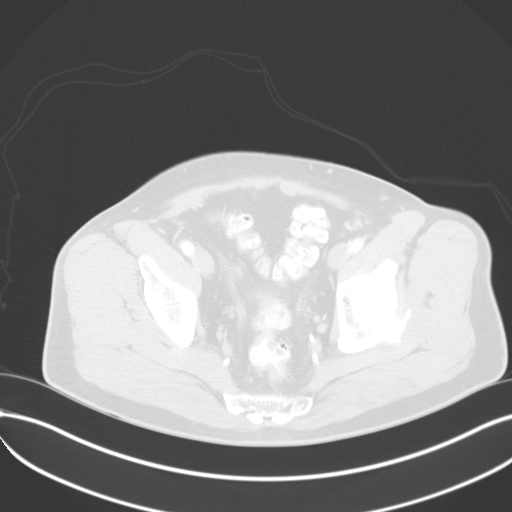
[im 34/125  lung]
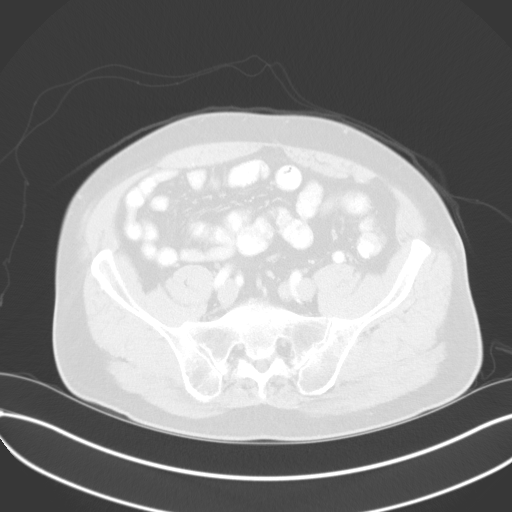
[im 46/125  lung]
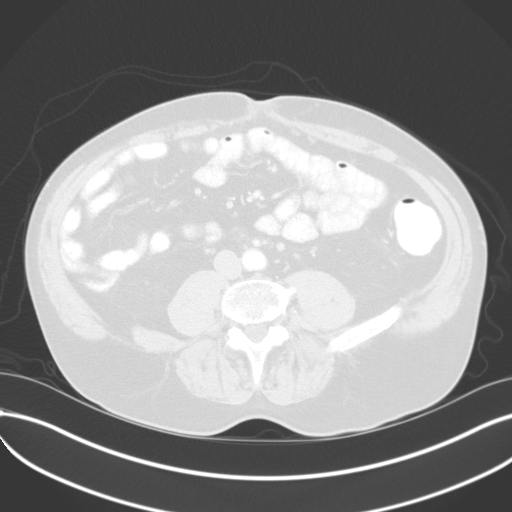
[im 57/125  mediastinal]
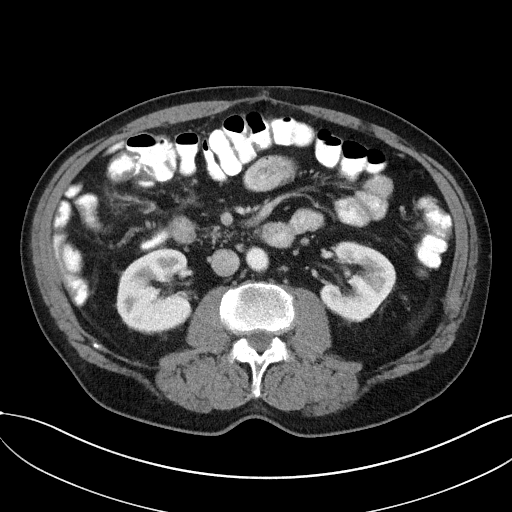
[im 57/125  lung]
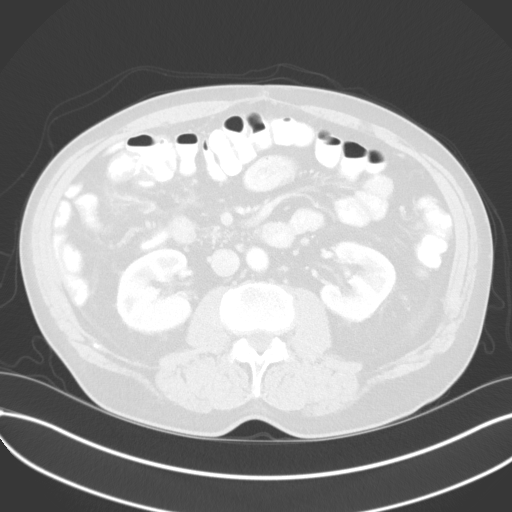
[im 68/125  lung]
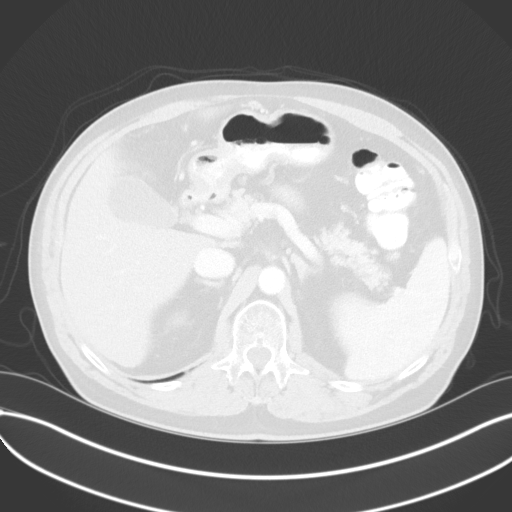
[im 79/125  lung]
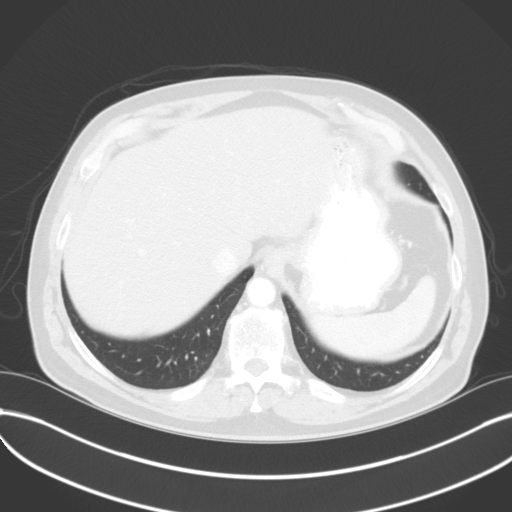
[im 91/125  lung]
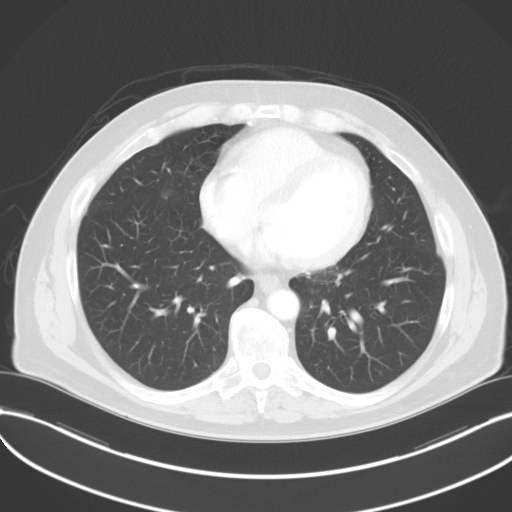
[im 102/125  mediastinal]
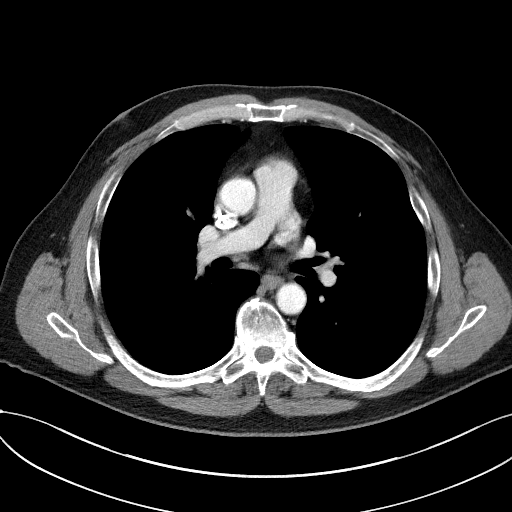
[im 102/125  lung]
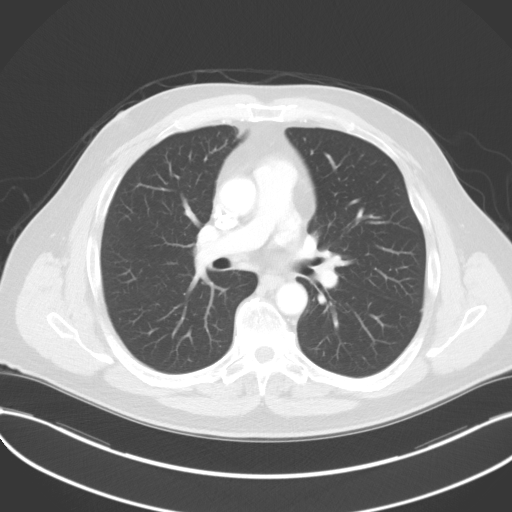
[im 113/125  lung]
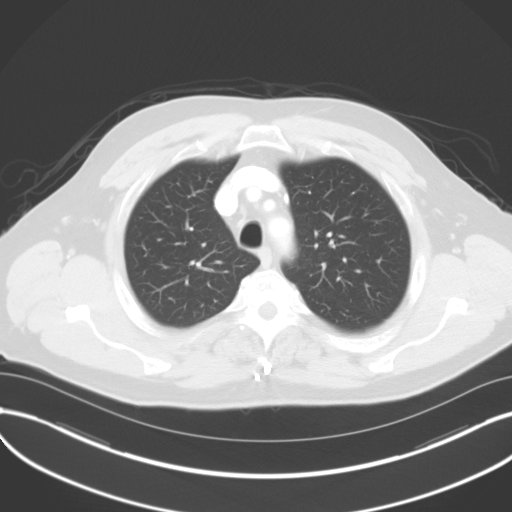

[Series 6: coronals · coronal · 0.80mm/px · 3 of 140 slices shown]
[im 28/140  lung]
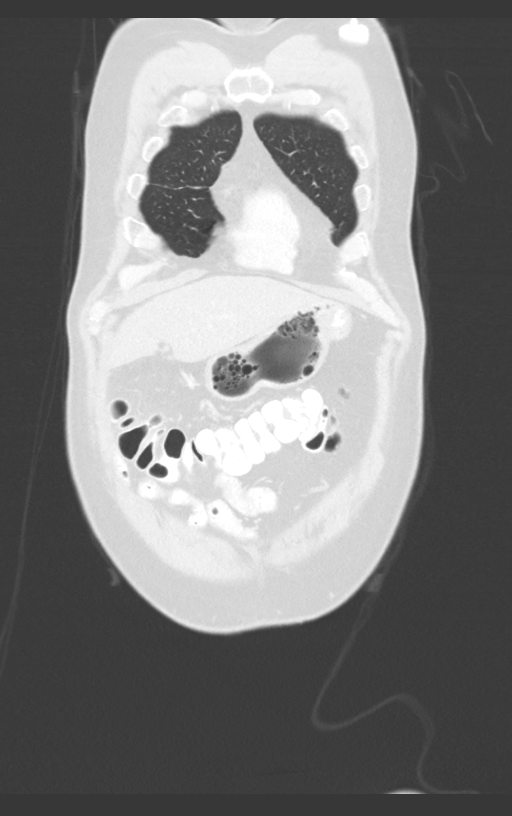
[im 56/140  lung]
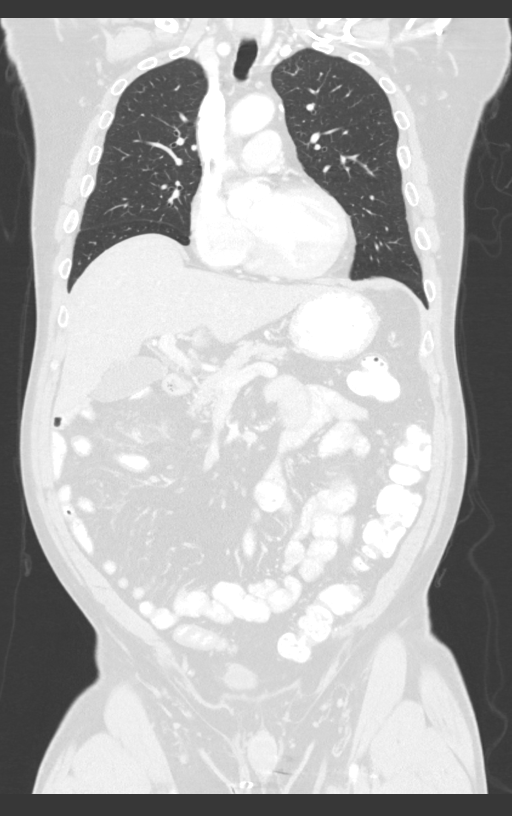
[im 84/140  lung]
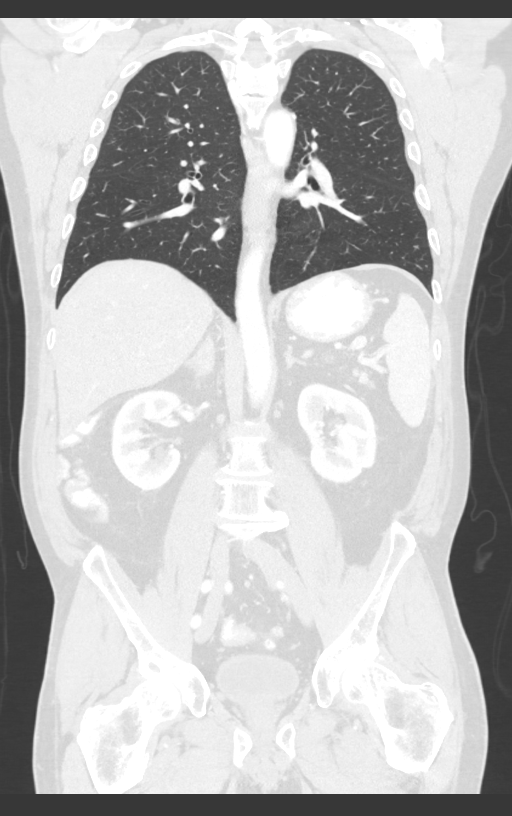

[13 of 36 positions shown; findings below may reference images not displayed]

FINDINGS: CT CHEST FINDINGS

Cardiovascular: Mild aortic atherosclerosis. Calcification in the
LAD and left circumflex and RCA coronary arteries noted. No
pericardial effusion identified.

Mediastinum/Nodes: No enlarged mediastinal, hilar, or axillary lymph
nodes. Thyroid gland, trachea, and esophagus demonstrate no
significant findings.

Lungs/Pleura: No pleural fluid.  No suspicious nodule or mass.

Musculoskeletal: No chest wall mass or suspicious bone lesions
identified.

CT ABDOMEN PELVIS FINDINGS

Hepatobiliary: Calcified granuloma identified within the left lobe
of liver. Gallbladder is normal. No biliary dilatation.

Pancreas: Unremarkable. No pancreatic ductal dilatation or
surrounding inflammatory changes.

Spleen: Normal in size without focal abnormality.

Adrenals/Urinary Tract: Normal appearance of the left adrenal gland.
Small nodule in the right adrenal gland measures 1 cm and is
unchanged from the previous exam. Likely small adenoma.Kidneys are
normal, without renal calculi, focal lesion, or hydronephrosis.
Bladder is unremarkable

Stomach/Bowel: The stomach is normal. No pathologic dilatation of
the small bowel loops. Status post right hemicolectomy with entero
colonic anastomosis. No mass identified at the anastomotic site. The
colon is unremarkable.

Vascular/Lymphatic: No significant vascular findings are present. No
enlarged abdominal or pelvic lymph nodes.

Reproductive: Prostate is unremarkable.

Other: No abdominal wall hernia or abnormality. No abdominopelvic
ascites.

Musculoskeletal: No acute or significant osseous findings.
IMPRESSION: 1. No acute findings and no evidence for metastatic disease within
the chest, abdomen or pelvis.
2. Right adrenal nodule, stable.  Likely benign adenoma.

## 2017-10-19 ENCOUNTER — Inpatient Hospital Stay: Payer: BLUE CROSS/BLUE SHIELD | Attending: Internal Medicine

## 2017-10-19 DIAGNOSIS — Z85038 Personal history of other malignant neoplasm of large intestine: Secondary | ICD-10-CM | POA: Diagnosis present

## 2017-10-19 DIAGNOSIS — Z452 Encounter for adjustment and management of vascular access device: Secondary | ICD-10-CM | POA: Insufficient documentation

## 2017-10-19 DIAGNOSIS — Z95828 Presence of other vascular implants and grafts: Secondary | ICD-10-CM

## 2017-10-19 MED ORDER — HEPARIN SOD (PORK) LOCK FLUSH 100 UNIT/ML IV SOLN
500.0000 [IU] | INTRAVENOUS | Status: AC | PRN
  Administered 2017-10-19: 500 [IU]

## 2017-10-19 MED ORDER — SODIUM CHLORIDE 0.9% FLUSH
10.0000 mL | INTRAVENOUS | Status: AC | PRN
  Administered 2017-10-19: 10 mL
  Filled 2017-10-19: qty 10

## 2017-12-02 ENCOUNTER — Other Ambulatory Visit: Payer: Self-pay | Admitting: *Deleted

## 2017-12-02 ENCOUNTER — Ambulatory Visit
Admission: RE | Admit: 2017-12-02 | Discharge: 2017-12-02 | Disposition: A | Discharge status: Home / Self Care | Payer: BLUE CROSS/BLUE SHIELD | Source: Ambulatory Visit | Attending: Internal Medicine | Admitting: Internal Medicine

## 2017-12-02 DIAGNOSIS — N400 Enlarged prostate without lower urinary tract symptoms: Secondary | ICD-10-CM | POA: Insufficient documentation

## 2017-12-02 DIAGNOSIS — D500 Iron deficiency anemia secondary to blood loss (chronic): Secondary | ICD-10-CM

## 2017-12-02 DIAGNOSIS — K429 Umbilical hernia without obstruction or gangrene: Secondary | ICD-10-CM | POA: Insufficient documentation

## 2017-12-02 DIAGNOSIS — I700 Atherosclerosis of aorta: Secondary | ICD-10-CM | POA: Insufficient documentation

## 2017-12-02 DIAGNOSIS — C182 Malignant neoplasm of ascending colon: Secondary | ICD-10-CM | POA: Insufficient documentation

## 2017-12-02 DIAGNOSIS — K760 Fatty (change of) liver, not elsewhere classified: Secondary | ICD-10-CM | POA: Diagnosis not present

## 2017-12-02 LAB — POCT I-STAT CREATININE: CREATININE: 0.9 mg/dL (ref 0.61–1.24)

## 2017-12-02 MED ORDER — IOPAMIDOL (ISOVUE-300) INJECTION 61%
100.0000 mL | Freq: Once | INTRAVENOUS | Status: AC | PRN
  Administered 2017-12-02: 100 mL via INTRAVENOUS

## 2017-12-05 ENCOUNTER — Other Ambulatory Visit: Payer: Self-pay

## 2017-12-05 ENCOUNTER — Inpatient Hospital Stay: Payer: BLUE CROSS/BLUE SHIELD | Attending: Internal Medicine | Admitting: Internal Medicine

## 2017-12-05 ENCOUNTER — Inpatient Hospital Stay: Payer: BLUE CROSS/BLUE SHIELD

## 2017-12-05 ENCOUNTER — Encounter: Payer: Self-pay | Admitting: Internal Medicine

## 2017-12-05 VITALS — BP 145/94 | HR 73 | Temp 97.9°F | Resp 20 | Wt 223.0 lb

## 2017-12-05 DIAGNOSIS — Z95828 Presence of other vascular implants and grafts: Secondary | ICD-10-CM

## 2017-12-05 DIAGNOSIS — G620 Drug-induced polyneuropathy: Secondary | ICD-10-CM

## 2017-12-05 DIAGNOSIS — C182 Malignant neoplasm of ascending colon: Secondary | ICD-10-CM | POA: Insufficient documentation

## 2017-12-05 DIAGNOSIS — D500 Iron deficiency anemia secondary to blood loss (chronic): Secondary | ICD-10-CM

## 2017-12-05 LAB — COMPREHENSIVE METABOLIC PANEL
ALBUMIN: 4.3 g/dL (ref 3.5–5.0)
ALT: 59 U/L (ref 17–63)
AST: 39 U/L (ref 15–41)
Alkaline Phosphatase: 63 U/L (ref 38–126)
Anion gap: 8 (ref 5–15)
BUN: 17 mg/dL (ref 6–20)
CHLORIDE: 105 mmol/L (ref 101–111)
CO2: 23 mmol/L (ref 22–32)
CREATININE: 0.76 mg/dL (ref 0.61–1.24)
Calcium: 9.2 mg/dL (ref 8.9–10.3)
GFR calc Af Amer: >60 mL/min (ref >60)
GFR calc non Af Amer: >60 mL/min (ref >60)
Glucose, Bld: 97 mg/dL (ref 65–99)
POTASSIUM: 4.5 mmol/L (ref 3.5–5.1)
SODIUM: 136 mmol/L (ref 135–145)
Total Bilirubin: 0.6 mg/dL (ref 0.3–1.2)
Total Protein: 7.4 g/dL (ref 6.5–8.1)

## 2017-12-05 LAB — CBC WITH DIFFERENTIAL/PLATELET
Basophils Absolute: 0.1 10*3/uL (ref 0–0.1)
Basophils Relative: 2 %
EOS ABS: 0.1 10*3/uL (ref 0–0.7)
Eosinophils Relative: 1 %
HCT: 46.2 % (ref 40.0–52.0)
Hemoglobin: 16.2 g/dL (ref 13.0–18.0)
LYMPHS ABS: 1.7 10*3/uL (ref 1.0–3.6)
LYMPHS PCT: 19 %
MCH: 32.5 pg (ref 26.0–34.0)
MCHC: 35 g/dL (ref 32.0–36.0)
MCV: 92.7 fL (ref 80.0–100.0)
MONOS PCT: 7 %
Monocytes Absolute: 0.7 10*3/uL (ref 0.2–1.0)
Neutro Abs: 6.4 10*3/uL (ref 1.4–6.5)
Neutrophils Relative %: 71 %
Platelets: 170 10*3/uL (ref 150–440)
RBC: 4.98 MIL/uL (ref 4.40–5.90)
RDW: 13.6 % (ref 11.5–14.5)
WBC: 9 10*3/uL (ref 3.8–10.6)

## 2017-12-05 MED ORDER — SODIUM CHLORIDE 0.9% FLUSH
10.0000 mL | INTRAVENOUS | Status: AC | PRN
  Administered 2017-12-05: 10 mL
  Filled 2017-12-05: qty 10

## 2017-12-05 MED ORDER — HEPARIN SOD (PORK) LOCK FLUSH 100 UNIT/ML IV SOLN
500.0000 [IU] | INTRAVENOUS | Status: AC | PRN
  Administered 2017-12-05: 500 [IU]

## 2017-12-05 NOTE — Assessment & Plan Note (Addendum)
#   Ascending colon cancer; stage III  S/p  adjuvant chemotherapy with FOLFOX every 2 weeks 12 [finished Dec 2017] Given the positive margin- at surgery;s/p RT [finished feb 7th].  # Clinically no evidence of recurrence;  CEA normal.  CT 2019-C/A/P-MARCH NED  #Peripheral neuropathy grade 1; improving.  # port flush- discussed re: explantation- will refer to Dr.Byrnett  # follow up in 6 months/ labs; CT prior.   # 25 minutes face-to-face with the patient discussing the above plan of care; more than 50% of time spent on prognosis/ natural history; counseling and coordination.

## 2017-12-05 NOTE — Progress Notes (Signed)
The hospital [place] NOTE  Patient Care Team: Albina Billet, MD as PCP - General (Internal Medicine) Marden Noble, MD (Internal Medicine) Christene Lye, MD (General Surgery) Cammie Sickle, MD as Consulting Physician (Internal Medicine)  CHIEF COMPLAINTS/PURPOSE OF CONSULTATION:  Oncology History   # June-July 2017-Ascending COLON CA STAGE III [pT3pN2 (4/14LN positive); POSITIVE RADIAL MARGIN; Dr.Sankar]; pre-op CEA- 1.5/N; MRI- liver-NEG; 03/26/2016- PET-NED   # June 24th 2017-  FOLFOX q 2 W; s/p RT [? Positive margin- finished feb 2018]  # IDA- [aug 2017] s/p IV venofer x4.   # colonoscopy [Dr.Sankar; June 2018; repeat summer 2021 ]   # MOLECULAR TESTING: MSI-STABLE; F-ONE-[july 2017] B- RAF V 600 E; Wild type Kras/N-ras ; others**     Cancer of ascending colon ([place])     HISTORY OF PRESENTING ILLNESS:  Philip Shah 62 y.o.  male above history of stage III colon cancer is Currently Status post adjuvant FOLFOX; also s/p RT [finished Feb 2018]-here for follow-up/reviewed the results of his restaging CAT scan.  Patient states his neuropathy in his hand and feet is improving.  Denies any blood in stools or black colored stools.  No nausea no vomiting no abdominal pain.  Is gaining weight which he attributes to dietary indiscretion.  Is interested to have the port taken out.  ROS: A complete 10 point review of system is done which is negative except mentioned above in history of present illness.   MEDICAL HISTORY:  Past Medical History:  Diagnosis Date  . Abnormal colonoscopy 02-2016  . Anemia   . Colon cancer ([place])    colon ca, stage III  . Enlarged prostate   . GERD (gastroesophageal reflux disease)   . H/O vasectomy 74  . Hemorrhoids   . Hx of ligation of vein 1981  . Sciatica   . Status post colon resection 02-26-16    SURGICAL HISTORY: Past Surgical History:  Procedure Laterality Date  . APPENDECTOMY     . COLONOSCOPY WITH PROPOFOL N/A 02/11/2016   Procedure: COLONOSCOPY WITH PROPOFOL;  Surgeon: Christene Lye, MD;  Location: [facility];  Service: Endoscopy;  Laterality: N/A;  . COLONOSCOPY WITH PROPOFOL N/A 03/01/2017   Procedure: COLONOSCOPY WITH PROPOFOL;  Surgeon: Christene Lye, MD;  Location: [facility];  Service: Endoscopy;  Laterality: N/A;  . ESOPHAGOGASTRODUODENOSCOPY (EGD) WITH PROPOFOL N/A 02/11/2016   Procedure: ESOPHAGOGASTRODUODENOSCOPY (EGD) WITH PROPOFOL;  Surgeon: Christene Lye, MD;  Location: [facility];  Service: Endoscopy;  Laterality: N/A;  . LAPAROSCOPIC RIGHT COLECTOMY Right 02/26/2016   Procedure: LAPAROSCOPIC RIGHT COLECTOMY;  Surgeon: Christene Lye, MD;  Location: [facility];  Service: General;  Laterality: Right;  . PORTACATH PLACEMENT N/A 03/18/2016   Procedure: INSERTION PORT-A-CATH;  Surgeon: Christene Lye, MD;  Location: [facility];  Service: General;  Laterality: N/A;  . VASECTOMY    . 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  Social History   Socioeconomic History  . Marital status: Single    Spouse name: Not on file  . Number of children: Not on file  . Years of education: Not on file  . Highest education level: Not on file  Occupational History  . Not on file  Social Needs  . Financial resource strain: Not on file  . Food insecurity:    Worry: Not on file  Inability: Not on file  . Transportation needs:    Medical: Not on file    Non-medical: Not on file  Tobacco Use  . Smoking status: Never Smoker  . Smokeless tobacco: Never Used  Substance and Sexual Activity  . Alcohol use: No    Alcohol/week: 0.0 oz    Comment: 3/day  . Drug use: No  . Sexual activity: Not on file  Lifestyle  . Physical activity:    Days per week: Not on file    Minutes per session: Not on file  . Stress: Not on  file  Relationships  . Social connections:    Talks on phone: Not on file    Gets together: Not on file    Attends religious service: Not on file    Active member of club or organization: Not on file    Attends meetings of clubs or organizations: Not on file    Relationship status: Not on file  . Intimate partner violence:    Fear of current or ex partner: Not on file    Emotionally abused: Not on file    Physically abused: Not on file    Forced sexual activity: Not on file  Other Topics Concern  . Not on file  Social History Narrative  . Not on file    FAMILY HISTORY: mat aunt- anal cancer; oldest brother/pre-cancerous polyps- [Dr. In [place]] Family History  Problem Relation Age of Onset  . Kidney failure Mother   . Cancer Father        spine    ALLERGIES:  is allergic to penicillins.  MEDICATIONS:  Current Outpatient Medications  Medication Sig Dispense Refill  . b complex vitamins capsule Take 1 capsule by mouth daily.    Marland Kitchen lidocaine-prilocaine (EMLA) cream APPLY ONE APPLICATION TOPICALLY AS NEEDED. APPLY GENEROUSLY OVER THE MEDIPORT 45 MINUTES PRIOR TO CHEMOTHERAPY. 30 g 1  . Multiple Vitamin (MULTIVITAMIN WITH MINERALS) TABS tablet Take 1 tablet by mouth daily.    . Saw Palmetto, Serenoa repens, (SAW PALMETTO PO) Take 1 capsule by mouth daily.     Marland Kitchen zinc gluconate 50 MG tablet Take 50 mg by mouth daily.    . ondansetron (ZOFRAN) 8 MG tablet Take 1 tablet (8 mg total) by mouth every 8 (eight) hours as needed for nausea or vomiting (start 3 days; after chemo). (Patient not taking: Reported on 09/07/2017) 40 tablet 1   No current facility-administered medications for this visit.    Facility-Administered Medications Ordered in Other Visits  Medication Dose Route Frequency Provider Last Rate Last Dose  . sodium chloride flush (NS) 0.9 % injection 10 mL  10 mL Intravenous PRN Cammie Sickle, MD   10 mL at 05/04/17 0903      .  PHYSICAL EXAMINATION: ECOG  PERFORMANCE STATUS: 0 - Asymptomatic  Vitals:   12/05/17 1445  BP: (!) 145/94  Pulse: 73  Resp: 20  Temp: 97.9 F (36.6 C)   Filed Weights   12/05/17 1515  Weight: 223 lb (101.2 kg)    GENERAL: Well-nourished well-developed; Alert, no distress and comfortable. He is alone. EYES: no pallor or icterus OROPHARYNX: no thrush or ulceration; good dentition  NECK: supple, no masses felt LYMPH:  no palpable lymphadenopathy in the cervical, axillary or inguinal regions LUNGS: clear to auscultation and  No wheeze or crackles HEART/CVS: regular rate & rhythm and no murmurs; No lower extremity edema ABDOMEN: abdomen soft, non-tender and normal bowel sounds.  Musculoskeletal:no cyanosis of digits and  no clubbing  PSYCH: alert & oriented x 3 with fluent speech NEURO: no focal motor/sensory deficits SKIN:  no rashes or significant lesions  LABORATORY DATA:  I have reviewed the data as listed Lab Results  Component Value Date   WBC 9.0 12/05/2017   HGB 16.2 12/05/2017   HCT 46.2 12/05/2017   MCV 92.7 12/05/2017   PLT 170 12/05/2017   Recent Labs    07/14/17 0835  09/07/17 1424 12/02/17 0857 12/05/17 1435  NA 136  --  136  --  136  K 4.7  --  4.5  --  4.5  CL 104  --  104  --  105  CO2 25  --  24  --  23  GLUCOSE 113*  --  90  --  97  BUN 14  --  14  --  17  CREATININE 0.83   < > 0.83 0.90 0.76  CALCIUM 9.1  --  9.0  --  9.2  GFRNONAA >60  --  >60  --  >60  GFRAA >60  --  >60  --  >60  PROT 7.7  --  7.3  --  7.4  ALBUMIN 4.4  --  4.2  --  4.3  AST 53*  --  39  --  39  ALT 65*  --  57  --  59  ALKPHOS 65  --  82  --  63  BILITOT 0.9  --  0.6  --  0.6   < > = values in this interval not displayed.    RADIOGRAPHIC STUDIES: I have personally reviewed the radiological images as listed and agreed with the findings in the report. Ct Abdomen Pelvis W Contrast  Result Date: 12/02/2017 CLINICAL DATA:  Colon cancer diagnosed in June 2017. Previous surgery with chemotherapy and  radiation therapy. EXAM: CT ABDOMEN AND PELVIS WITH CONTRAST TECHNIQUE: Multidetector CT imaging of the abdomen and pelvis was performed using the standard protocol following bolus administration of intravenous contrast. CONTRAST:  139m ISOVUE-300 IOPAMIDOL (ISOVUE-300) INJECTION 61% COMPARISON:  CT 07/14/2017 and 03/21/2017. FINDINGS: Lower chest: Mild atelectasis medially at the right lung base. The lung bases are otherwise clear. There is no significant pleural or pericardial effusion. Mild aortic and coronary artery atherosclerosis again noted. Hepatobiliary: Diffuse hepatic steatosis is again noted. There is a stable calcified granuloma in the left lobe. No focal hepatic lesions are identified. No evidence of gallstones, gallbladder wall thickening or biliary dilatation. Pancreas: Unremarkable. No pancreatic ductal dilatation or surrounding inflammatory changes. Spleen: Normal in size without focal abnormality. Adrenals/Urinary Tract: The adrenal glands appear stable without significant findings. Both kidneys appear normal. There is no evidence of renal mass, hydronephrosis or perinephric soft tissue stranding. There is mild bladder wall thickening which may be secondary to incomplete distention. Stomach/Bowel: No evidence of bowel wall thickening, distention or surrounding inflammatory change. Status post right hemicolectomy and ileocolonic anastomosis. Vascular/Lymphatic: There are no enlarged abdominal or pelvic lymph nodes. Mild aortic and branch vessel atherosclerosis. Reproductive: Stable mild enlargement the prostate gland which is heterogeneous with small dystrophic calcifications. Vasectomy clips are noted. Other: Stable small umbilical hernia containing only fat. No ascites or peritoneal nodularity. Musculoskeletal: No acute or significant osseous findings. Mild lumbar spine degenerative changes. Old rib fractures on the right and degenerative changes at both hips. IMPRESSION: 1. Stable examination.  No evidence of local recurrence or metastatic disease. 2. Hepatic steatosis. 3. Mild Aortic Atherosclerosis (ICD10-I70.0). Small umbilical hernia containing only fat. Mild prostatomegaly.  Electronically Signed   By: Richardean Sale M.D.   On: 12/02/2017 11:00   IMPRESSION: No evidence of recurrent or metastatic carcinoma.  Stable hepatic steatosis and small benign right adrenal adenoma.  Stable mildly enlarged prostate.   Electronically Signed   By: Earle Gell M.D.   On: 03/21/2017 13:39 ASSESSMENT & PLAN:   Cancer of ascending colon ([place]) # Ascending colon cancer; stage III  S/p  adjuvant chemotherapy with FOLFOX every 2 weeks 12 [finished Dec 2017] Given the positive margin- at surgery;s/p RT [finished feb 7th].  # Clinically no evidence of recurrence;  CEA normal.  CT 2019-C/A/P-MARCH NED  #Peripheral neuropathy grade 1; improving.  # port flush- discussed re: explantation- will refer to Dr.Byrnett  # follow up in 6 months/ labs; CT prior.   # 25 minutes face-to-face with the patient discussing the above plan of care; more than 50% of time spent on prognosis/ natural history; counseling and coordination.      Cammie Sickle, MD 12/06/2017 4:43 PM

## 2017-12-06 ENCOUNTER — Telehealth: Payer: Self-pay | Admitting: Internal Medicine

## 2017-12-06 NOTE — Telephone Encounter (Signed)
Please refer to Dr.Byrnett for port explantation. Thx

## 2017-12-06 NOTE — Addendum Note (Signed)
Addended by: Sandria Bales B on: 12/06/2017 04:48 PM   Modules accepted: Orders

## 2017-12-07 NOTE — Telephone Encounter (Signed)
Referral has been placed. 

## 2018-01-17 IMAGING — CT CT CHEST W/ CM
2 of 3 series · 16 of 31 positions shown, 19 images · IV contrast (iopamidol)
Comparison: 11/22/2016

CLINICAL DATA: Followup colon carcinoma. Previous surgery,
chemotherapy, and radiation therapy.

EXAM:
CT CHEST, ABDOMEN, AND PELVIS WITH CONTRAST
TECHNIQUE: Multidetector CT imaging of the chest, abdomen and pelvis was
performed following the standard protocol during bolus
administration of intravenous contrast.
CONTRAST:  100mL LLCDKH-YZZ IOPAMIDOL (LLCDKH-YZZ) INJECTION 61%

[Series 2: cap with · axial · 0.77mm/px · z∈[-1032,-506]mm · 10 of 133 slices shown, 13 images]
[im 14/133  mediastinal]
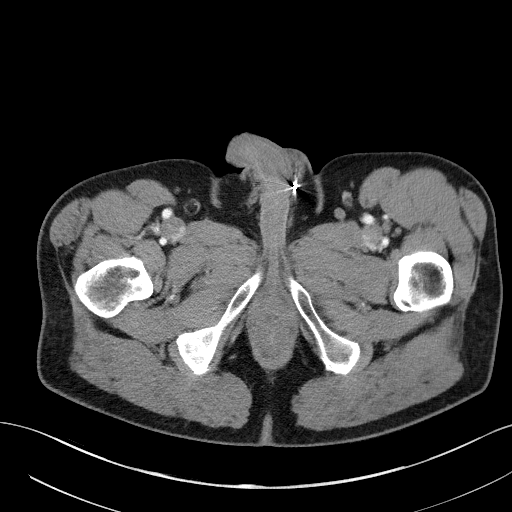
[im 14/133  lung]
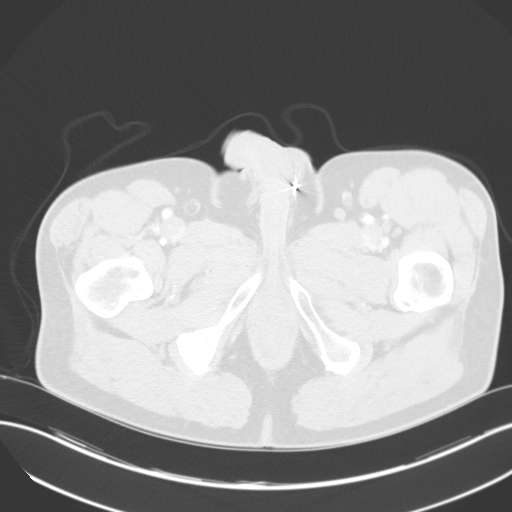
[im 27/133  lung]
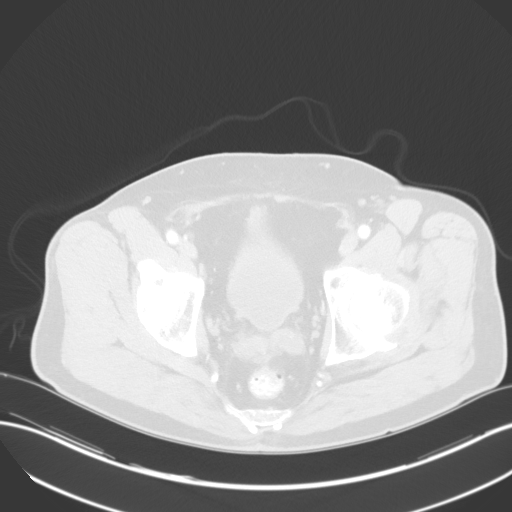
[im 40/133  lung]
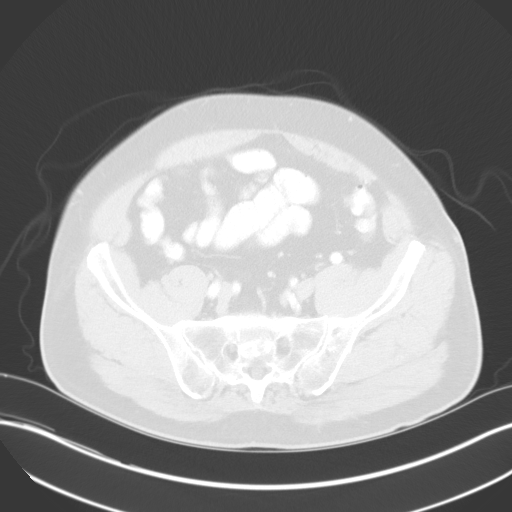
[im 53/133  lung]
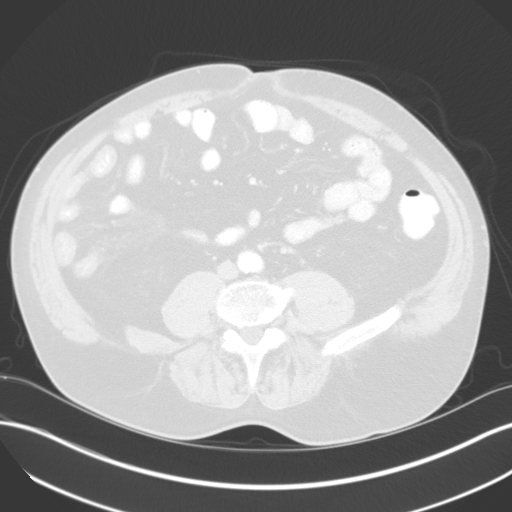
[im 65/133  mediastinal]
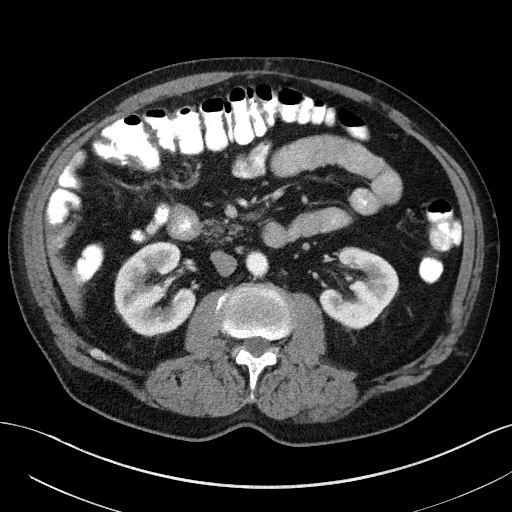
[im 65/133  lung]
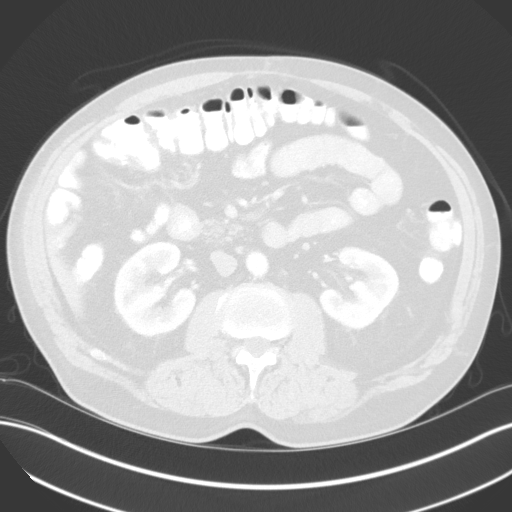
[im 67/133  lung]
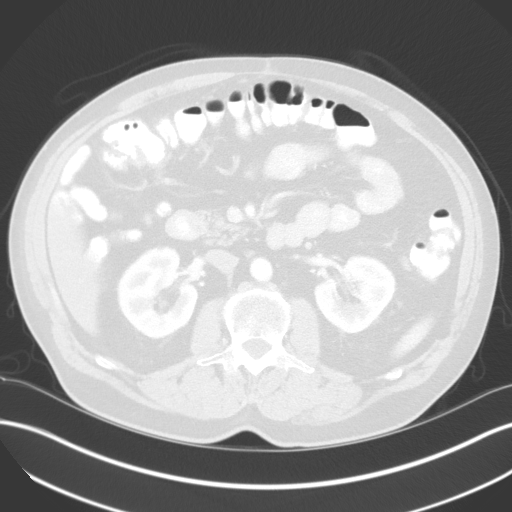
[im 80/133  lung]
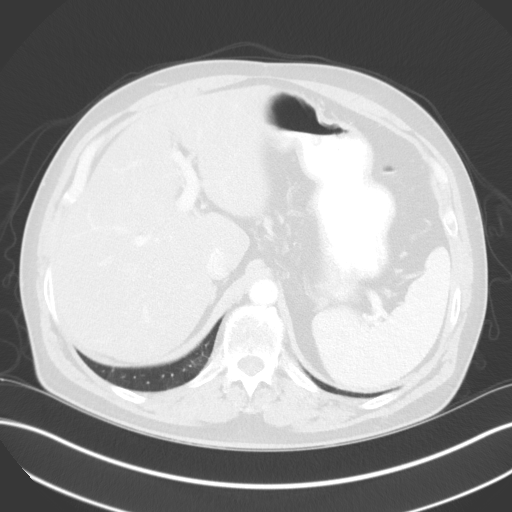
[im 93/133  lung]
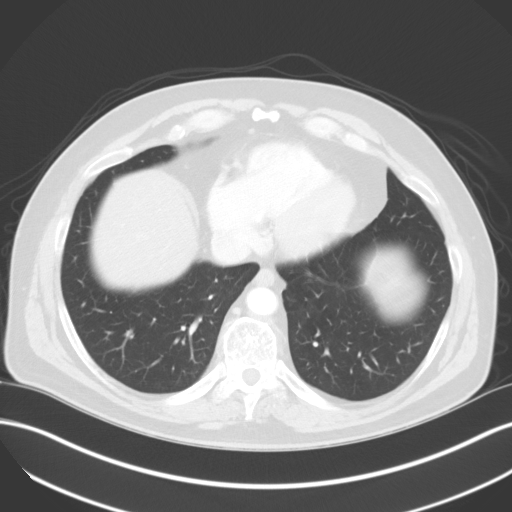
[im 106/133  mediastinal]
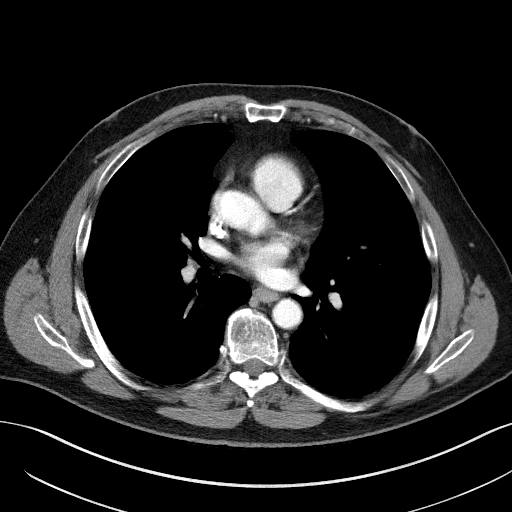
[im 106/133  lung]
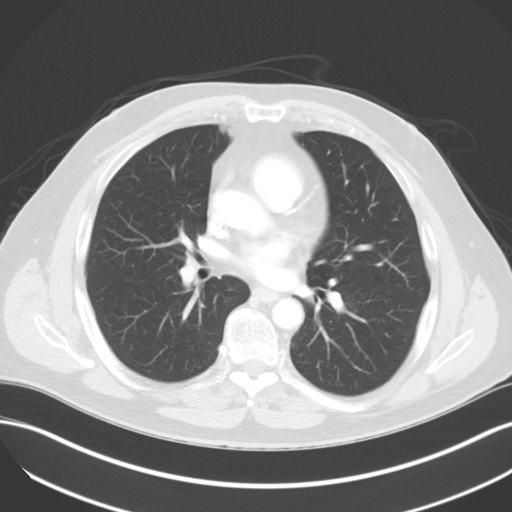
[im 119/133  lung]
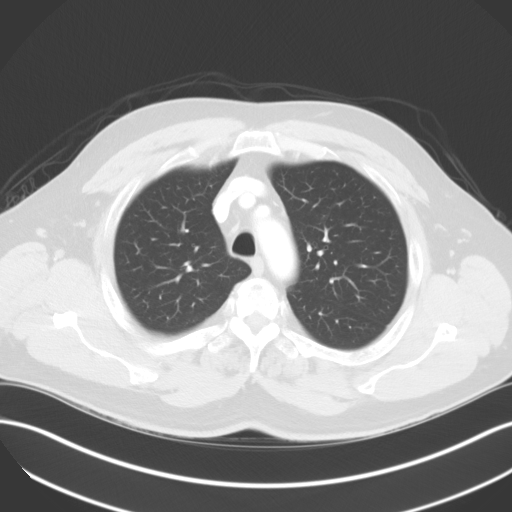

[Series 4: lung · axial · 0.77mm/px · z∈[-728,-558]mm · 6 of 159 slices shown]
[im 13/159  lung]
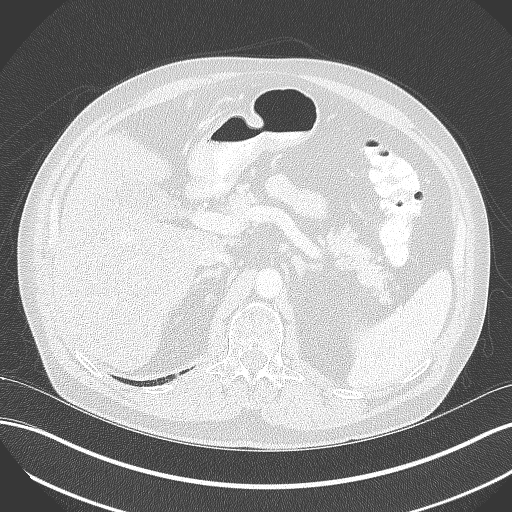
[im 37/159  lung]
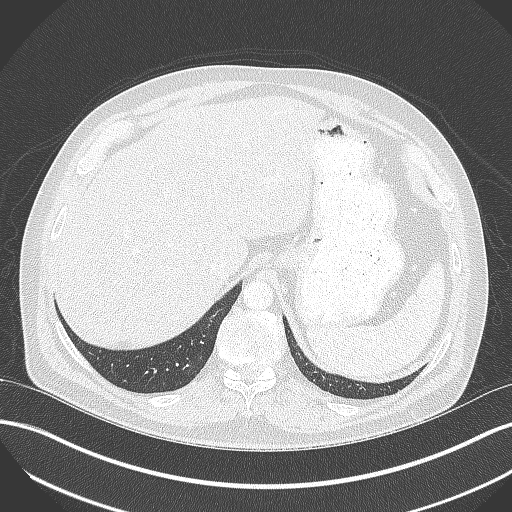
[im 61/159  lung]
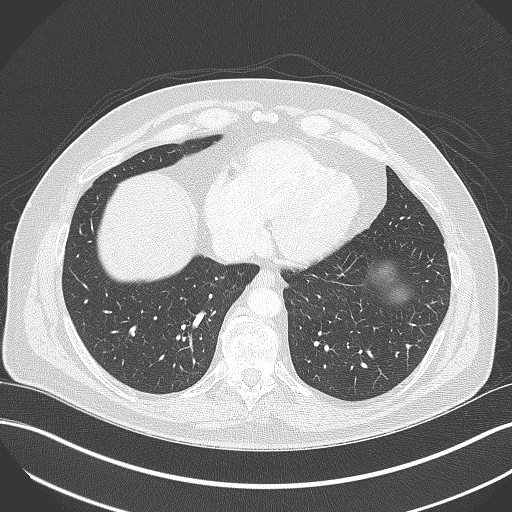
[im 73/159  lung]
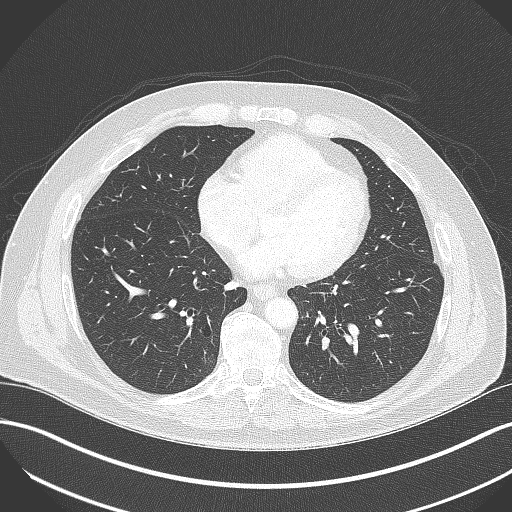
[im 86/159  lung]
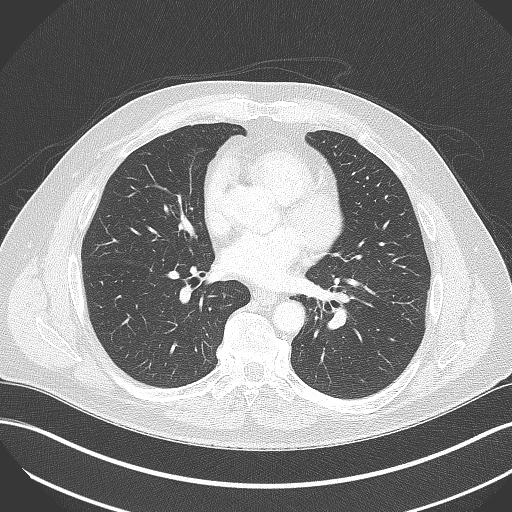
[im 98/159  lung]
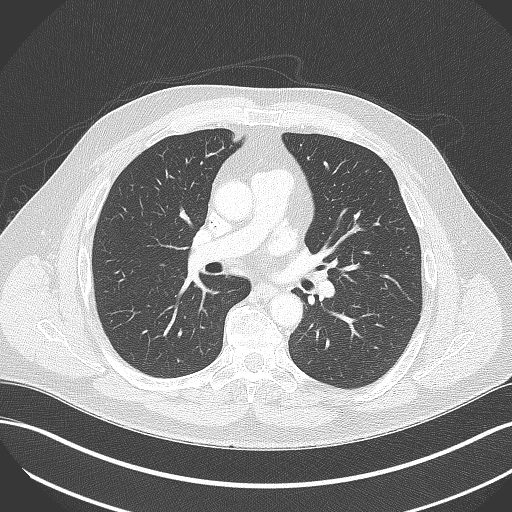

[16 of 31 positions shown; findings below may reference images not displayed]

FINDINGS: CT CHEST FINDINGS

Cardiovascular: No acute findings.

Mediastinum/Lymph Nodes: No masses or pathologically enlarged lymph
nodes identified.

Lungs/Pleura: No pulmonary infiltrate or mass identified. No
effusion present.

Musculoskeletal:  No suspicious bone lesions identified.

CT ABDOMEN AND PELVIS FINDINGS

Hepatobiliary: No masses identified. Moderate diffuse hepatic
steatosis. Gallbladder is unremarkable.

Pancreas:  No mass or inflammatory changes.

Spleen:  Within normal limits in size and appearance.

Adrenals/Urinary tract: Stable 1 cm benign right adrenal adenoma. No
evidence of renal masses Or hydronephrosis. Unremarkable unopacified
urinary bladder.

Stomach/Bowel: Stable postop changes from prior right colectomy. No
evidence of obstruction, inflammatory process, or abnormal fluid
collections.

Vascular/Lymphatic: No pathologically enlarged lymph nodes
identified. No abdominal aortic aneurysm.

Reproductive:  Stable mildly enlarged prostate.

Other:  None.

Musculoskeletal: No suspicious bone lesions identified. Bilateral
hip osteoarthritis, left side greater than right.
IMPRESSION: No evidence of recurrent or metastatic carcinoma.

Stable hepatic steatosis and small benign right adrenal adenoma.

Stable mildly enlarged prostate.

## 2018-02-07 ENCOUNTER — Ambulatory Visit: Payer: BLUE CROSS/BLUE SHIELD | Admitting: General Surgery

## 2018-02-07 ENCOUNTER — Encounter: Payer: Self-pay | Admitting: General Surgery

## 2018-02-07 VITALS — BP 130/70 | HR 83 | Resp 14 | Ht 72.0 in | Wt 200.0 lb

## 2018-02-07 DIAGNOSIS — C182 Malignant neoplasm of ascending colon: Secondary | ICD-10-CM | POA: Diagnosis not present

## 2018-02-07 NOTE — Patient Instructions (Addendum)
You may shower, use the ice pack off and on today.  You may remove your waterproof dressing in 2 days. Leave the steri strips in place they will fall off on their own.     Implanted Port Removal, Care After Refer to this sheet in the next few weeks. These instructions provide you with information about caring for yourself after your procedure. Your health care provider may also give you more specific instructions. Your treatment has been planned according to current medical practices, but problems sometimes occur. Call your health care provider if you have any problems or questions after your procedure. What can I expect after the procedure? After the procedure, it is common to have:  Soreness or pain near your incision.  Some swelling or bruising near your incision.  Follow these instructions at home: Medicines  Take over-the-counter and prescription medicines only as told by your health care provider.  If you were prescribed an antibiotic medicine, take it as told by your health care provider. Do not stop taking the antibiotic even if you start to feel better. Bathing  Do not take baths, swim, or use a hot tub until your health care provider approves. Ask your health care provider if you can take showers. You may only be allowed to take sponge baths for bathing. Incision care  Follow instructions from your health care provider about how to take care of your incision. Make sure you: ? Wash your hands with soap and water before you change your bandage (dressing). If soap and water are not available, use hand sanitizer. ? Change your dressing as told by your health care provider. ? Keep your dressing dry. ? Leave stitches (sutures), skin glue, or adhesive strips in place. These skin closures may need to stay in place for 2 weeks or longer. If adhesive strip edges start to loosen and curl up, you may trim the loose edges. Do not remove adhesive strips completely unless your health care  provider tells you to do that.  Check your incision area every day for signs of infection. Check for: ? More redness, swelling, or pain. ? More fluid or blood. ? Warmth. ? Pus or a bad smell. Driving  If you received a sedative, do not drive for 24 hours after the procedure.  If you did not receive a sedative, ask your health care provider when it is safe to drive. Activity  Return to your normal activities as told by your health care provider. Ask your health care provider what activities are safe for you.  Until your health care provider says it is safe: ? Do not lift anything that is heavier than 10 lb (4.5 kg). ? Do not do activities that involve lifting your arms over your head. General instructions  Do not use any tobacco products, such as cigarettes, chewing tobacco, and e-cigarettes. Tobacco can delay healing. If you need help quitting, ask your health care provider.  Keep all follow-up visits as told by your health care provider. This is important. Contact a health care provider if:  You have more redness, swelling, or pain around your incision.  You have more fluid or blood coming from your incision.  Your incision feels warm to the touch.  You have pus or a bad smell coming from your incision.  You have a fever.  You have pain that is not relieved by your pain medicine. Get help right away if:  You have chest pain.  You have difficulty breathing. This information is  not intended to replace advice given to you by your health care provider. Make sure you discuss any questions you have with your health care provider. Document Released: 08/04/2015 Document Revised: 01/29/2016 Document Reviewed: 05/28/2015 Elsevier Interactive Patient Education  [employer].

## 2018-02-07 NOTE — Progress Notes (Signed)
Patient ID: Philip Shah, male   DOB: 1956/01/05, 62 y.o.   MRN: [id number]  Chief Complaint  Patient presents with  . Procedure    HPI Philip Shah is a 62 y.o. male here today for a port removal.  HPI  Past Medical History:  Diagnosis Date  . Abnormal colonoscopy 02-2016  . Anemia   . Colon cancer ([place])    colon ca, stage III  . Enlarged prostate   . GERD (gastroesophageal reflux disease)   . H/O vasectomy 72  . Hemorrhoids   . Hx of ligation of vein 1981  . Sciatica   . Status post colon resection 02-26-16    Past Surgical History:  Procedure Laterality Date  . APPENDECTOMY    . COLONOSCOPY WITH PROPOFOL N/A 02/11/2016   Procedure: COLONOSCOPY WITH PROPOFOL;  Surgeon: Christene Lye, MD;  Location: [facility];  Service: Endoscopy;  Laterality: N/A;  . COLONOSCOPY WITH PROPOFOL N/A 03/01/2017   Procedure: COLONOSCOPY WITH PROPOFOL;  Surgeon: Christene Lye, MD;  Location: [facility] ENDOSCOPY;  Service: Endoscopy;  Laterality: N/A;  . ESOPHAGOGASTRODUODENOSCOPY (EGD) WITH PROPOFOL N/A 02/11/2016   Procedure: ESOPHAGOGASTRODUODENOSCOPY (EGD) WITH PROPOFOL;  Surgeon: Christene Lye, MD;  Location: [facility] ENDOSCOPY;  Service: Endoscopy;  Laterality: N/A;  . LAPAROSCOPIC RIGHT COLECTOMY Right 02/26/2016   Procedure: LAPAROSCOPIC RIGHT COLECTOMY;  Surgeon: Christene Lye, MD;  Location: [facility] ORS;  Service: General;  Laterality: Right;  . PORTACATH PLACEMENT N/A 03/18/2016   Procedure: INSERTION PORT-A-CATH;  Surgeon: Christene Lye, MD;  Location: [facility] ORS;  Service: General;  Laterality: N/A;  . VASECTOMY    . VEIN LIGATION Right 1980's   Dr Pat Kocher    Family History  Problem Relation Age of Onset  . Kidney failure Mother   . Cancer Father        spine    Social History Social History   Tobacco Use  . Smoking status: Never Smoker  . Smokeless tobacco: Never Used  Substance Use Topics  . Alcohol use: No   Alcohol/week: 0.0 oz    Comment: 3/day  . Drug use: No    Allergies  Allergen Reactions  . Penicillins Rash    Rash as a child. Has patient had a PCN reaction causing immediate rash, facial/tongue/throat swelling, SOB or lightheadedness with hypotension: No Has patient had a PCN reaction causing severe rash involving mucus membranes or skin necrosis: No Has patient had a PCN reaction that required hospitalization No Has patient had a PCN reaction occurring within the last 10 years: No If all of the above answers are "NO", then may proceed with Cephalosporin use.     Current Outpatient Medications  Medication Sig Dispense Refill  . Multiple Vitamin (MULTIVITAMIN WITH MINERALS) TABS tablet Take 1 tablet by mouth daily.    . Saw Palmetto, Serenoa repens, (SAW PALMETTO PO) Take 1 capsule by mouth daily.     Marland Kitchen zinc gluconate 50 MG tablet Take 50 mg by mouth daily.     No current facility-administered medications for this visit.    Facility-Administered Medications Ordered in Other Visits  Medication Dose Route Frequency Provider Last Rate Last Dose  . sodium chloride flush (NS) 0.9 % injection 10 mL  10 mL Intravenous PRN Cammie Sickle, MD   10 mL at 05/04/17 0175    Review of Systems Review of Systems  Constitutional: Negative.   Respiratory: Negative.   Cardiovascular: Negative.     Blood pressure 130/70, pulse 83,  resp. rate 14, height 6' (1.829 m), weight 200 lb (90.7 kg).  Physical Exam Physical Exam  Constitutional: He is oriented to person, place, and time. He appears well-developed and well-nourished.  Pulmonary/Chest:    Neurological: He is alert and oriented to person, place, and time.  Skin: Skin is warm and dry.    Data Reviewed Port removal order received.  Assessment    Reviewed procedure for port removal.    Plan    10 cc of 0.5% Xylocaine with 0.25% Marcaine with 1 to 200,000 units of epinephrine was utilized and well-tolerated.   ChloraPrep was applied to the skin.  The original incision was opened and the port exposed.  Transfixion sutures were removed and the port was extracted with an intact tip.  No bleeding noted.  The prior port pocket was closed with a running 3-0 Vicryl suture.  The skin was closed with a running 3-0 Vicryl subarticular suture.  Benzoin, Steri-Strips, Telfa and Tegaderm dressing applied.  The patient tolerated the procedure well.  Ice provided.  Post wound care instructions reviewed.  Follow up in one week with the nurse    HPI, Physical Exam, Assessment and Plan have been scribed under the direction and in the presence of Robert Bellow, MD  Concepcion Living, LPN  I have completed the exam and reviewed the above documentation for accuracy and completeness.  I agree with the above.  Haematologist has been used and any errors in dictation or transcription are unintentional.  Hervey Ard, M.D., F.A.C.S.  Philip Shah Philip Shah 02/07/2018, 11:41 AM

## 2018-02-15 ENCOUNTER — Ambulatory Visit (INDEPENDENT_AMBULATORY_CARE_PROVIDER_SITE_OTHER): Payer: BLUE CROSS/BLUE SHIELD | Admitting: *Deleted

## 2018-02-15 DIAGNOSIS — C182 Malignant neoplasm of ascending colon: Secondary | ICD-10-CM

## 2018-02-15 NOTE — Progress Notes (Signed)
Patient ID: Philip Shah, male   DOB: 1955-12-28, 62 y.o.   MRN: [id number]   Patient came in today for a wound check post port removal.  The wound is clean, with no signs of infection noted. The patient is aware to call back for any questions or new concerns. Follow up as needed.

## 2018-02-15 NOTE — Patient Instructions (Signed)
The patient is aware to call back for any questions or concerns.  

## 2018-03-08 ENCOUNTER — Ambulatory Visit
Admission: RE | Admit: 2018-03-08 | Discharge: 2018-03-08 | Disposition: A | Discharge status: Home / Self Care | Payer: BLUE CROSS/BLUE SHIELD | Source: Ambulatory Visit | Attending: Radiation Oncology | Admitting: Radiation Oncology

## 2018-03-08 ENCOUNTER — Encounter: Payer: Self-pay | Admitting: Radiation Oncology

## 2018-03-08 ENCOUNTER — Other Ambulatory Visit: Payer: Self-pay

## 2018-03-08 VITALS — BP 157/91 | HR 80 | Temp 97.6°F | Resp 20 | Wt 203.9 lb

## 2018-03-08 DIAGNOSIS — Z923 Personal history of irradiation: Secondary | ICD-10-CM | POA: Insufficient documentation

## 2018-03-08 DIAGNOSIS — Z85038 Personal history of other malignant neoplasm of large intestine: Secondary | ICD-10-CM | POA: Diagnosis present

## 2018-03-08 DIAGNOSIS — C182 Malignant neoplasm of ascending colon: Secondary | ICD-10-CM

## 2018-03-08 DIAGNOSIS — Z9221 Personal history of antineoplastic chemotherapy: Secondary | ICD-10-CM | POA: Insufficient documentation

## 2018-03-08 NOTE — Progress Notes (Signed)
Radiation Oncology Follow up Note  Name: Philip Shah   Date:   03/08/2018 MRN:  [id number] DOB: June 02, 1956    This 62 y.o. male presents to the clinic today for 2.5 year follow-up status post adjuvant chemoradiation for stage IIIa adenocarcinoma the descending colon.  REFERRING PROVIDER: Marden Noble, MD  HPI: patient is a 62 year old male now seen out 2.5 years having completed adjuvant radiation therapy along with chemotherapy for stage IIIa (T4 N2 M0) adenocarcinoma the ascending colon seen today in routine follow-up he is doing well specifically denies any abdominal complaints. He tends towards certain foods causing some GI upset including certain cooking oils and fast food..CT scans back inMarch are reviewed showing stable examination no evidence of local recurrence or metastatic disease.his CEA remains normal.  COMPLICATIONS OF TREATMENT: none  FOLLOW UP COMPLIANCE: keeps appointments   PHYSICAL EXAM:  BP (!) 157/91   Pulse 80   Temp 97.6 F (36.4 C)   Resp 20   Wt 203 lb 14.8 oz (92.5 kg)   BMI 27.66 kg/m  Well-developed well-nourished patient in NAD. HEENT reveals PERLA, EOMI, discs not visualized.  Oral cavity is clear. No oral mucosal lesions are identified. Neck is clear without evidence of cervical or supraclavicular adenopathy. Lungs are clear to A&P. Cardiac examination is essentially unremarkable with regular rate and rhythm without murmur rub or thrill. Abdomen is benign with no organomegaly or masses noted. Motor sensory and DTR levels are equal and symmetric in the upper and lower extremities. Cranial nerves II through XII are grossly intact. Proprioception is intact. No peripheral adenopathy or edema is identified. No motor or sensory levels are noted. Crude visual fields are within normal range.  RADIOLOGY RESULTS: CT scans reviewed and compatible with the above-stated findings  PLAN: present time patient continues to do well now 2-1/2 years out with no  evidence of disease. I'm please was overall progress. He continues close follow-up care with medical oncology. I've asked to see him back in 1 year for follow-up. Patient is to call sooner with any concerns. I would like to take this opportunity to thank you for allowing me to participate in the care of your patient.Noreene Filbert, MD

## 2018-05-09 ENCOUNTER — Encounter: Payer: Self-pay | Admitting: *Deleted

## 2018-05-09 ENCOUNTER — Other Ambulatory Visit: Payer: Self-pay

## 2018-05-11 NOTE — Discharge Instructions (Signed)

## 2018-05-15 ENCOUNTER — Ambulatory Visit: Payer: BLUE CROSS/BLUE SHIELD | Admitting: Anesthesiology

## 2018-05-15 ENCOUNTER — Ambulatory Visit
Admission: RE | Admit: 2018-05-15 | Discharge: 2018-05-15 | Disposition: A | Discharge status: Home / Self Care | Payer: BLUE CROSS/BLUE SHIELD | Source: Ambulatory Visit | Attending: Ophthalmology | Admitting: Ophthalmology

## 2018-05-15 ENCOUNTER — Encounter
Admission: RE | Disposition: A | Discharge status: Home / Self Care | Payer: Self-pay | Source: Ambulatory Visit | Attending: Ophthalmology

## 2018-05-15 DIAGNOSIS — Z85038 Personal history of other malignant neoplasm of large intestine: Secondary | ICD-10-CM | POA: Diagnosis not present

## 2018-05-15 DIAGNOSIS — H2511 Age-related nuclear cataract, right eye: Secondary | ICD-10-CM | POA: Insufficient documentation

## 2018-05-15 DIAGNOSIS — Z9221 Personal history of antineoplastic chemotherapy: Secondary | ICD-10-CM | POA: Diagnosis not present

## 2018-05-15 DIAGNOSIS — Z79899 Other long term (current) drug therapy: Secondary | ICD-10-CM | POA: Insufficient documentation

## 2018-05-15 HISTORY — PX: CATARACT EXTRACTION W/PHACO: SHX586

## 2018-05-15 HISTORY — DX: Polyneuropathy, unspecified: G62.9

## 2018-05-15 SURGERY — PHACOEMULSIFICATION, CATARACT, WITH IOL INSERTION
Anesthesia: Monitor Anesthesia Care | Site: Eye | Laterality: Right | Wound class: "Clean "

## 2018-05-15 MED ORDER — MIDAZOLAM HCL 2 MG/2ML IJ SOLN
INTRAMUSCULAR | Status: DC | PRN
  Administered 2018-05-15: 2 mg via INTRAVENOUS

## 2018-05-15 MED ORDER — EPINEPHRINE PF 1 MG/ML IJ SOLN
INTRAOCULAR | Status: DC | PRN
  Administered 2018-05-15: 101 mL via OPHTHALMIC

## 2018-05-15 MED ORDER — SODIUM HYALURONATE 23 MG/ML IO SOLN
INTRAOCULAR | Status: DC | PRN
  Administered 2018-05-15: 0.6 mL via INTRAOCULAR

## 2018-05-15 MED ORDER — FENTANYL CITRATE (PF) 100 MCG/2ML IJ SOLN
INTRAMUSCULAR | Status: DC | PRN
  Administered 2018-05-15 (×2): 50 ug via INTRAVENOUS

## 2018-05-15 MED ORDER — ARMC OPHTHALMIC DILATING DROPS
1.0000 "application " | OPHTHALMIC | Status: DC | PRN
  Administered 2018-05-15 (×3): 1 via OPHTHALMIC

## 2018-05-15 MED ORDER — LACTATED RINGERS IV SOLN: INTRAVENOUS | Status: DC

## 2018-05-15 MED ORDER — MOXIFLOXACIN HCL 0.5 % OP SOLN
OPHTHALMIC | Status: DC | PRN
  Administered 2018-05-15: 0.2 mL via OPHTHALMIC

## 2018-05-15 MED ORDER — SODIUM HYALURONATE 10 MG/ML IO SOLN
INTRAOCULAR | Status: DC | PRN
  Administered 2018-05-15: 0.55 mL via INTRAOCULAR

## 2018-05-15 MED ORDER — LIDOCAINE HCL (PF) 2 % IJ SOLN
INTRAOCULAR | Status: DC | PRN
  Administered 2018-05-15: 1 mL via INTRAOCULAR

## 2018-05-15 SURGICAL SUPPLY — 17 items
CANNULA ANT/CHMB 27G (MISCELLANEOUS) ×1 IMPLANT
CANNULA ANT/CHMB 27GA (MISCELLANEOUS) ×3 IMPLANT
DISSECTOR HYDRO NUCLEUS 50X22 (MISCELLANEOUS) ×3 IMPLANT
GLOVE BIO SURGEON STRL SZ8 (GLOVE) ×3 IMPLANT
GLOVE SURG LX 7.5 STRW (GLOVE) ×2
GLOVE SURG LX STRL 7.5 STRW (GLOVE) ×1 IMPLANT
GOWN STRL REUS W/ TWL LRG LVL3 (GOWN DISPOSABLE) ×2 IMPLANT
GOWN STRL REUS W/TWL LRG LVL3 (GOWN DISPOSABLE) ×4
LENS IOL TECNIS ITEC 22.0 (Intraocular Lens) ×2 IMPLANT
MARKER SKIN DUAL TIP RULER LAB (MISCELLANEOUS) ×3 IMPLANT
PACK CATARACT (MISCELLANEOUS) ×3 IMPLANT
PACK DR. KING ARMS (PACKS) ×3 IMPLANT
PACK EYE AFTER SURG (MISCELLANEOUS) ×3 IMPLANT
SYR 3ML LL SCALE MARK (SYRINGE) ×3 IMPLANT
SYR TB 1ML LUER SLIP (SYRINGE) ×3 IMPLANT
WATER STERILE IRR 500ML POUR (IV SOLUTION) ×3 IMPLANT
WIPE NON LINTING 3.25X3.25 (MISCELLANEOUS) ×3 IMPLANT

## 2018-05-15 NOTE — Transfer of Care (Signed)
Immediate Anesthesia Transfer of Care Note  Patient: Philip Shah  Procedure(s) Performed: CATARACT EXTRACTION PHACO AND INTRAOCULAR LENS PLACEMENT (IOC) RIGHT (Right Eye)  Patient Location: PACU  Anesthesia Type: MAC  Level of Consciousness: awake, alert  and patient cooperative  Airway and Oxygen Therapy: Patient Spontanous Breathing and Patient connected to supplemental oxygen  Post-op Assessment: Post-op Vital signs reviewed, Patient's Cardiovascular Status Stable, Respiratory Function Stable, Patent Airway and No signs of Nausea or vomiting  Post-op Vital Signs: Reviewed and stable  Complications: No apparent anesthesia complications

## 2018-05-15 NOTE — Anesthesia Postprocedure Evaluation (Signed)
Anesthesia Post Note  Patient: Philip Shah  Procedure(s) Performed: CATARACT EXTRACTION PHACO AND INTRAOCULAR LENS PLACEMENT (IOC) RIGHT (Right Eye)  Patient location during evaluation: PACU Anesthesia Type: MAC Level of consciousness: awake Pain management: pain level controlled Vital Signs Assessment: post-procedure vital signs reviewed and stable Respiratory status: spontaneous breathing Cardiovascular status: blood pressure returned to baseline Postop Assessment: no headache Anesthetic complications: no    Lavonna Monarch

## 2018-05-15 NOTE — Op Note (Signed)
OPERATIVE NOTE  Philip Shah [id number] 05/15/2018   PREOPERATIVE DIAGNOSIS:  Nuclear sclerotic cataract right eye.  H25.11   POSTOPERATIVE DIAGNOSIS:    Nuclear sclerotic cataract right eye.     PROCEDURE:  Phacoemusification with posterior chamber intraocular lens placement of the right eye   LENS:   Implant Name Type Inv. Item Serial No. Manufacturer Lot No. LRB No. Used  LENS IOL DIOP 22.0 - [id number] Intraocular Lens LENS IOL DIOP 22.0 [id number] AMO  Right 1       PCB00 +22.0   ULTRASOUND TIME: 0 minutes 57.9 seconds.  CDE 9.22   SURGEON:  Benay Pillow, MD, MPH  ANESTHESIOLOGIST: Anesthesiologist: Lavonna Monarch, MD CRNA: Mayme Genta, CRNA   ANESTHESIA:  Topical with tetracaine drops augmented with 1% preservative-free intracameral lidocaine.  ESTIMATED BLOOD LOSS: less than 1 mL.   COMPLICATIONS:  None.   DESCRIPTION OF PROCEDURE:  The patient was identified in the holding room and transported to the operating room and placed in the supine position under the operating microscope.  The right eye was identified as the operative eye and it was prepped and draped in the usual sterile ophthalmic fashion.   A 1.0 millimeter clear-corneal paracentesis was made at the 10:30 position. 0.5 ml of preservative-free 1% lidocaine with epinephrine was injected into the anterior chamber.  The anterior chamber was filled with Healon 5 viscoelastic.  A 2.4 millimeter keratome was used to make a near-clear corneal incision at the 8:00 position.  A curvilinear capsulorrhexis was made with a cystotome and capsulorrhexis forceps.  Balanced salt solution was used to hydrodissect and hydrodelineate the nucleus.   Phacoemulsification was then used in stop and chop fashion to remove the lens nucleus and epinucleus.  The remaining cortex was then removed using the irrigation and aspiration handpiece. Healon was then placed into the capsular bag to distend it for lens placement.  A lens was  then injected into the capsular bag.  The remaining viscoelastic was aspirated.   Wounds were hydrated with balanced salt solution.  The anterior chamber was inflated to a physiologic pressure with balanced salt solution.   Intracameral vigamox 0.1 mL undiluted was injected into the eye and a drop placed onto the ocular surface.  No wound leaks were noted.  The patient was taken to the recovery room in stable condition without complications of anesthesia or surgery  Benay Pillow 05/15/2018, 9:25 AM

## 2018-05-15 NOTE — H&P (Signed)
The History and Physical notes are on paper, have been signed, and are to be scanned.   I have examined the patient and there are no changes to the H&P.   Benay Pillow 05/15/2018 8:47 AM

## 2018-05-15 NOTE — Anesthesia Preprocedure Evaluation (Addendum)
Anesthesia Evaluation  Patient identified by MRN, date of birth, ID band Patient awake    Reviewed: Allergy & Precautions, NPO status , Patient's Chart, lab work & pertinent test results, reviewed documented beta blocker date and time   Airway Mallampati: II  TM Distance: >3 FB Neck ROM: Full    Dental no notable dental hx.    Pulmonary neg pulmonary ROS,    Pulmonary exam normal breath sounds clear to auscultation       Cardiovascular negative cardio ROS Normal cardiovascular exam Rhythm:Regular Rate:Normal     Neuro/Psych  Neuromuscular disease negative psych ROS   GI/Hepatic Neg liver ROS, GERD  ,Ascending colon cancer, s/p resection chemo, in remission for 1.5 years   Endo/Other  negative endocrine ROS  Renal/GU negative Renal ROS     Musculoskeletal negative musculoskeletal ROS (+)   Abdominal (+) + obese,   Peds  Hematology  (+) anemia ,   Anesthesia Other Findings   Reproductive/Obstetrics                            Anesthesia Physical Anesthesia Plan  ASA: II  Anesthesia Plan: MAC   Post-op Pain Management:    Induction: Intravenous  PONV Risk Score and Plan:   Airway Management Planned: Nasal Cannula  Additional Equipment: None  Intra-op Plan:   Post-operative Plan:   Informed Consent: I have reviewed the patients History and Physical, chart, labs and discussed the procedure including the risks, benefits and alternatives for the proposed anesthesia with the patient or authorized representative who has indicated his/her understanding and acceptance.     Plan Discussed with: CRNA, Anesthesiologist and Surgeon  Anesthesia Plan Comments:        Anesthesia Quick Evaluation

## 2018-05-15 NOTE — Anesthesia Procedure Notes (Signed)
Procedure Name: MAC Performed by: Don Giarrusso, CRNA Pre-anesthesia Checklist: Patient identified, Emergency Drugs available, Suction available, Timeout performed and Patient being monitored Patient Re-evaluated:Patient Re-evaluated prior to induction Oxygen Delivery Method: Nasal cannula Placement Confirmation: positive ETCO2       

## 2018-05-17 ENCOUNTER — Encounter: Payer: Self-pay | Admitting: Ophthalmology

## 2018-06-06 ENCOUNTER — Ambulatory Visit
Admission: RE | Admit: 2018-06-06 | Discharge: 2018-06-06 | Disposition: A | Discharge status: Home / Self Care | Payer: BLUE CROSS/BLUE SHIELD | Source: Ambulatory Visit | Attending: Internal Medicine | Admitting: Internal Medicine

## 2018-06-06 DIAGNOSIS — K760 Fatty (change of) liver, not elsewhere classified: Secondary | ICD-10-CM | POA: Diagnosis not present

## 2018-06-06 DIAGNOSIS — Z9049 Acquired absence of other specified parts of digestive tract: Secondary | ICD-10-CM | POA: Insufficient documentation

## 2018-06-06 DIAGNOSIS — C182 Malignant neoplasm of ascending colon: Secondary | ICD-10-CM | POA: Insufficient documentation

## 2018-06-06 DIAGNOSIS — K4020 Bilateral inguinal hernia, without obstruction or gangrene, not specified as recurrent: Secondary | ICD-10-CM | POA: Insufficient documentation

## 2018-06-06 LAB — POCT I-STAT CREATININE: CREATININE: 0.9 mg/dL (ref 0.61–1.24)

## 2018-06-06 MED ORDER — IOPAMIDOL (ISOVUE-300) INJECTION 61%
100.0000 mL | Freq: Once | INTRAVENOUS | Status: AC | PRN
  Administered 2018-06-06: 100 mL via INTRAVENOUS

## 2018-06-08 ENCOUNTER — Inpatient Hospital Stay: Payer: BLUE CROSS/BLUE SHIELD | Admitting: Internal Medicine

## 2018-06-08 ENCOUNTER — Inpatient Hospital Stay: Payer: BLUE CROSS/BLUE SHIELD | Attending: Internal Medicine

## 2018-06-08 ENCOUNTER — Encounter: Payer: Self-pay | Admitting: Internal Medicine

## 2018-06-08 ENCOUNTER — Other Ambulatory Visit: Payer: Self-pay

## 2018-06-08 ENCOUNTER — Other Ambulatory Visit: Payer: Self-pay | Admitting: *Deleted

## 2018-06-08 VITALS — BP 163/96 | HR 76 | Temp 98.6°F | Resp 20 | Ht 66.0 in | Wt 202.0 lb

## 2018-06-08 DIAGNOSIS — R0789 Other chest pain: Secondary | ICD-10-CM | POA: Insufficient documentation

## 2018-06-08 DIAGNOSIS — R202 Paresthesia of skin: Secondary | ICD-10-CM | POA: Diagnosis not present

## 2018-06-08 DIAGNOSIS — C182 Malignant neoplasm of ascending colon: Secondary | ICD-10-CM | POA: Insufficient documentation

## 2018-06-08 DIAGNOSIS — R030 Elevated blood-pressure reading, without diagnosis of hypertension: Secondary | ICD-10-CM

## 2018-06-08 LAB — CBC WITH DIFFERENTIAL/PLATELET
Basophils Absolute: 0.1 10*3/uL (ref 0–0.1)
Basophils Relative: 1 %
Eosinophils Absolute: 0.1 10*3/uL (ref 0–0.7)
Eosinophils Relative: 1 %
HCT: 45.8 % (ref 40.0–52.0)
Hemoglobin: 15.8 g/dL (ref 13.0–18.0)
Lymphocytes Relative: 21 %
Lymphs Abs: 1.7 10*3/uL (ref 1.0–3.6)
MCH: 32.3 pg (ref 26.0–34.0)
MCHC: 34.6 g/dL (ref 32.0–36.0)
MCV: 93.3 fL (ref 80.0–100.0)
Monocytes Absolute: 0.6 10*3/uL (ref 0.2–1.0)
Monocytes Relative: 8 %
Neutro Abs: 5.5 10*3/uL (ref 1.4–6.5)
Neutrophils Relative %: 69 %
Platelets: 165 10*3/uL (ref 150–440)
RBC: 4.91 MIL/uL (ref 4.40–5.90)
RDW: 13.5 % (ref 11.5–14.5)
WBC: 8 10*3/uL (ref 3.8–10.6)

## 2018-06-08 LAB — COMPREHENSIVE METABOLIC PANEL WITH GFR
ALT: 60 U/L — ABNORMAL HIGH (ref 0–44)
AST: 43 U/L — ABNORMAL HIGH (ref 15–41)
Albumin: 4 g/dL (ref 3.5–5.0)
Alkaline Phosphatase: 60 U/L (ref 38–126)
Anion gap: 7 (ref 5–15)
BUN: 11 mg/dL (ref 8–23)
CO2: 26 mmol/L (ref 22–32)
Calcium: 9.1 mg/dL (ref 8.9–10.3)
Chloride: 106 mmol/L (ref 98–111)
Creatinine, Ser: 0.74 mg/dL (ref 0.61–1.24)
GFR calc Af Amer: >60 mL/min
GFR calc non Af Amer: >60 mL/min
Glucose, Bld: 125 mg/dL — ABNORMAL HIGH (ref 70–99)
Potassium: 4.2 mmol/L (ref 3.5–5.1)
Sodium: 139 mmol/L (ref 135–145)
Total Bilirubin: 0.8 mg/dL (ref 0.3–1.2)
Total Protein: 7 g/dL (ref 6.5–8.1)

## 2018-06-08 NOTE — Assessment & Plan Note (Addendum)
#   Ascending colon cancer; stage III  S/p  adjuvant chemotherapy with FOLFOX; s/p RT [finished feb 7th 2018+ margin].  # Clinically no evidence of recurrence;  CEA normal.  October 2019 CT scan negative for recurrence.  # Left chest wall pain- ? Rib fracture vs other- if not improved to call for CXR/bone scan.   #Tingling and numbness-grade 1 improving.  #Elevated blood pressure-recommend checking at home; if still elevated return to PCP.  # follow up in 6 months/ labs; CT prior A/P prior.   # 25 minutes face-to-face with the patient discussing the above plan of care; more than 50% of time spent on prognosis/ natural history; counseling and coordination.

## 2018-06-08 NOTE — Progress Notes (Signed)
[facility] [place] NOTE  Patient Care Team: Albina Billet, MD as PCP - General (Internal Medicine) Marden Noble, MD (Internal Medicine) Christene Lye, MD (General Surgery) Cammie Sickle, MD as Consulting Physician (Internal Medicine)  CHIEF COMPLAINTS/PURPOSE OF CONSULTATION:  Oncology History   # June-July 2017-Ascending COLON CA STAGE III [pT3pN2 (4/14LN positive); POSITIVE RADIAL MARGIN; Dr.Sankar]; pre-op CEA- 1.5/N; MRI- liver-NEG; 03/26/2016- PET-NED   # June 24th 2017-  FOLFOX q 2 W; s/p RT [? Positive margin- finished feb 2018]  # IDA- [aug 2017] s/p IV venofer x4.   # colonoscopy [Dr.Sankar; June 2018; repeat summer 2021 ]   # MOLECULAR TESTING: MSI-STABLE; F-ONE-[july 2017] B- RAF V 600 E; Wild type Kras/N-ras ; others** --------------------------------------------------------------------    DIAGNOSIS: COLON CA  STAGE: III        ;GOALS: cure  CURRENT/MOST RECENT THERAPY: Surveillance      Cancer of ascending colon (HCC)     HISTORY OF PRESENTING ILLNESS:  Philip Shah 62 y.o.  male above history of stage III colon cancer currently on surveillance is here for follow-up/review the results of the CT scan.  In the interim patient had his port taken out.  Also had cataract surgeries.  Patient states that he had episode of coughing spell approximately week ago; and felt his left rib " pop"; hurts with a deep breath.  Otherwise improving.  Is not on any pain medication.  Denies any worsening shortness of breath.   No nausea no vomiting.  No abdominal pain.  Mild tingling and numbness in his extremities.  Otherwise no blood in stools black stools.  Review of Systems  Constitutional: Negative for chills, diaphoresis, fever, malaise/fatigue and weight loss.  HENT: Negative for nosebleeds and sore throat.   Eyes: Negative for double vision.  Respiratory: Negative for cough, hemoptysis, sputum production,  shortness of breath and wheezing.   Cardiovascular: Negative for chest pain (Left chest wall pain.), palpitations, orthopnea and leg swelling.  Gastrointestinal: Negative for abdominal pain, blood in stool, constipation, diarrhea, heartburn, melena, nausea and vomiting.  Genitourinary: Negative for dysuria, frequency and urgency.  Musculoskeletal: Negative for back pain and joint pain.  Skin: Negative.  Negative for itching and rash.  Neurological: Positive for tingling. Negative for dizziness, focal weakness, weakness and headaches.  Endo/Heme/Allergies: Does not bruise/bleed easily.  Psychiatric/Behavioral: Negative for depression. The patient is not nervous/anxious and does not have insomnia.      MEDICAL HISTORY:  Past Medical History:  Diagnosis Date  . Abnormal colonoscopy 02-2016  . Anemia   . Colon cancer ([place])    colon ca, stage III  . Enlarged prostate   . GERD (gastroesophageal reflux disease)   . H/O vasectomy 67  . Hemorrhoids   . Hx of ligation of vein 1981  . Neuropathy    feet and finger tips, secondary to chemo treatments  . Sciatica   . Status post colon resection 02-26-16    SURGICAL HISTORY: Past Surgical History:  Procedure Laterality Date  . APPENDECTOMY    . CATARACT EXTRACTION W/PHACO Right 05/15/2018   Procedure: CATARACT EXTRACTION PHACO AND INTRAOCULAR LENS PLACEMENT ([place]) RIGHT;  Surgeon: Eulogio Bear, MD;  Location: [place];  Service: Ophthalmology;  Laterality: Right;  . COLONOSCOPY WITH PROPOFOL N/A 02/11/2016   Procedure: COLONOSCOPY WITH PROPOFOL;  Surgeon: Christene Lye, MD;  Location: [facility] ENDOSCOPY;  Service: Endoscopy;  Laterality: N/A;  . COLONOSCOPY WITH PROPOFOL N/A 03/01/2017  Procedure: COLONOSCOPY WITH PROPOFOL;  Surgeon: Christene Lye, MD;  Location: [facility] ENDOSCOPY;  Service: Endoscopy;  Laterality: N/A;  . ESOPHAGOGASTRODUODENOSCOPY (EGD) WITH PROPOFOL N/A 02/11/2016   Procedure:  ESOPHAGOGASTRODUODENOSCOPY (EGD) WITH PROPOFOL;  Surgeon: Christene Lye, MD;  Location: [facility] ENDOSCOPY;  Service: Endoscopy;  Laterality: N/A;  . LAPAROSCOPIC RIGHT COLECTOMY Right 02/26/2016   Procedure: LAPAROSCOPIC RIGHT COLECTOMY;  Surgeon: Christene Lye, MD;  Location: [facility] ORS;  Service: General;  Laterality: Right;  . PORTACATH PLACEMENT N/A 03/18/2016   Procedure: INSERTION PORT-A-CATH;  Surgeon: Christene Lye, MD;  Location: [facility] ORS;  Service: General;  Laterality: N/A;  . VASECTOMY    . 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  Social History   Socioeconomic History  . Marital status: Single    Spouse name: Not on file  . Number of children: Not on file  . Years of education: Not on file  . Highest education level: Not on file  Occupational History  . Not on file  Social Needs  . Financial resource strain: Not on file  . Food insecurity:    Worry: Not on file    Inability: Not on file  . Transportation needs:    Medical: Not on file    Non-medical: Not on file  Tobacco Use  . Smoking status: Never Smoker  . Smokeless tobacco: Never Used  Substance and Sexual Activity  . Alcohol use: Yes    Alcohol/week: 5.0 standard drinks    Types: 5 Cans of beer per week    Comment:    . Drug use: No  . Sexual activity: Not on file  Lifestyle  . Physical activity:    Days per week: Not on file    Minutes per session: Not on file  . Stress: Not on file  Relationships  . Social connections:    Talks on phone: Not on file    Gets together: Not on file    Attends religious service: Not on file    Active member of club or organization: Not on file    Attends meetings of clubs or organizations: Not on file    Relationship status: Not on file  . Intimate partner violence:    Fear of current or ex partner: Not on file    Emotionally abused: Not on  file    Physically abused: Not on file    Forced sexual activity: Not on file  Other Topics Concern  . Not on file  Social History Narrative  . Not on file    FAMILY HISTORY: mat aunt- anal cancer; oldest brother/pre-cancerous polyps- [Dr. In [place]] Family History  Problem Relation Age of Onset  . Kidney failure Mother   . Cancer Father        spine    ALLERGIES:  is allergic to penicillins.  MEDICATIONS:  Current Outpatient Medications  Medication Sig Dispense Refill  . Multiple Vitamin (MULTIVITAMIN WITH MINERALS) TABS tablet Take 1 tablet by mouth daily.    . Saw Palmetto, Serenoa repens, (SAW PALMETTO PO) Take 1 capsule by mouth daily.     Marland Kitchen zinc gluconate 50 MG tablet Take 50 mg by mouth daily.     No current facility-administered medications for this visit.    Facility-Administered Medications Ordered in Other Visits  Medication Dose Route Frequency Provider Last Rate Last Dose  . sodium chloride flush (NS)  0.9 % injection 10 mL  10 mL Intravenous PRN Cammie Sickle, MD   10 mL at 05/04/17 0903      .  PHYSICAL EXAMINATION: ECOG PERFORMANCE STATUS: 0 - Asymptomatic  Vitals:   06/08/18 1122  BP: (!) 163/96  Pulse: 76  Resp: 20  Temp: 98.6 F (37 C)   Filed Weights   06/08/18 1122  Weight: 202 lb (91.6 kg)    Physical Exam  Constitutional: He is oriented to person, place, and time and well-developed, well-nourished, and in no distress.  HENT:  Head: Normocephalic and atraumatic.  Mouth/Throat: Oropharynx is clear and moist. No oropharyngeal exudate.  Eyes: Pupils are equal, round, and reactive to light.  Neck: Normal range of motion. Neck supple.  Cardiovascular: Normal rate and regular rhythm.  Pulmonary/Chest: No respiratory distress. He has no wheezes.  Abdominal: Soft. Bowel sounds are normal. He exhibits no distension and no mass. There is no tenderness. There is no rebound and no guarding.  Musculoskeletal: Normal range of motion. He  exhibits no edema or tenderness.  Neurological: He is alert and oriented to person, place, and time.  Skin: Skin is warm.  Psychiatric: Affect normal.    LABORATORY DATA:  I have reviewed the data as listed Lab Results  Component Value Date   WBC 8.0 06/08/2018   HGB 15.8 06/08/2018   HCT 45.8 06/08/2018   MCV 93.3 06/08/2018   PLT 165 06/08/2018   Recent Labs    09/07/17 1424  12/05/17 1435 06/06/18 1227 06/08/18 1053  NA 136  --  136  --  139  K 4.5  --  4.5  --  4.2  CL 104  --  105  --  106  CO2 24  --  23  --  26  GLUCOSE 90  --  97  --  125*  BUN 14  --  17  --  11  CREATININE 0.83   < > 0.76 0.90 0.74  CALCIUM 9.0  --  9.2  --  9.1  GFRNONAA >60  --  >60  --  >60  GFRAA >60  --  >60  --  >60  PROT 7.3  --  7.4  --  7.0  ALBUMIN 4.2  --  4.3  --  4.0  AST 39  --  39  --  43*  ALT 57  --  59  --  60*  ALKPHOS 82  --  63  --  60  BILITOT 0.6  --  0.6  --  0.8   < > = values in this interval not displayed.    RADIOGRAPHIC STUDIES: I have personally reviewed the radiological images as listed and agreed with the findings in the report. Ct Abdomen Pelvis W Contrast  Result Date: 06/06/2018 CLINICAL DATA:  Restaging colon cancer. Initial diagnosis 2 years ago. Patient had surgery, chemotherapy and radiation. EXAM: CT ABDOMEN AND PELVIS WITH CONTRAST TECHNIQUE: Multidetector CT imaging of the abdomen and pelvis was performed using the standard protocol following bolus administration of intravenous contrast. CONTRAST:  142m ISOVUE-300 IOPAMIDOL (ISOVUE-300) INJECTION 61% COMPARISON:  CT scan 12/02/2017 and 07/14/2017. FINDINGS: Lower chest: The lung bases are clear of acute process. No pleural effusion or pulmonary lesions. The heart is normal in size. No pericardial effusion. Coronary artery calcifications are noted. The distal esophagus and aorta are unremarkable. Hepatobiliary: Stable diffuse and fairly marked fatty infiltration of the liver but no focal hepatic lesions  to suggest metastatic disease.  Stable benign-appearing calcified granuloma in the left hepatic lobe. The portal and hepatic veins are patent. The gallbladder appears normal. No common bile duct dilatation. Pancreas: No mass, inflammation or ductal dilatation. Spleen: Normal size. No focal lesions. Small calcified granuloma noted inferiorly. Adrenals/Urinary Tract: The adrenal glands and kidneys are unremarkable. No renal, ureteral or bladder calculi or mass. Stomach/Bowel: The stomach, duodenum, small bowel and colon are unremarkable. No acute inflammatory changes, mass lesions or obstructive findings. Stable surgical changes from a right hemicolectomy with ileocolonic anastomosis. No findings for recurrent tumor. Vascular/Lymphatic: The aorta is normal in caliber. No dissection. The branch vessels are patent. The major venous structures are patent. No mesenteric or retroperitoneal mass or adenopathy. Small scattered lymph nodes are noted. Reproductive: The prostate gland and seminal vesicles are unremarkable and stable. Other: Stable small bilateral inguinal hernias and periumbilical abdominal wall hernia containing fat. Musculoskeletal: No significant bony findings. Stable degenerative changes involving the spine and hips. IMPRESSION: 1. Stable surgical changes from a right hemicolectomy and ileocolonic anastomosis. No findings for recurrent tumor, locoregional adenopathy or metastatic disease. 2. No acute abdominal/pelvic findings. 3. Stable diffuse fatty infiltration of the liver. 4. Stable periumbilical abdominal wall hernia and bilateral inguinal hernias containing fat. Electronically Signed   By: Marijo Sanes M.D.   On: 06/06/2018 16:00   ASSESSMENT & PLAN:   Cancer of ascending colon ([place]) # Ascending colon cancer; stage III  S/p  adjuvant chemotherapy with FOLFOX; s/p RT [finished feb 7th 2018+ margin].  # Clinically no evidence of recurrence;  CEA normal.  October 2019 CT scan negative for  recurrence.  # Left chest wall pain- ? Rib fracture vs other- if not improved to call for CXR/bone scan.   #Tingling and numbness-grade 1 improving.  #Elevated blood pressure-recommend checking at home; if still elevated return to PCP.  # follow up in 6 months/ labs; CT prior A/P prior.   # 25 minutes face-to-face with the patient discussing the above plan of care; more than 50% of time spent on prognosis/ natural history; counseling and coordination.      Cammie Sickle, MD 06/09/2018 6:25 AM

## 2018-06-08 NOTE — Progress Notes (Signed)
Patient is recovering from recent sinus infection. He stated that he coughed really hard last week and believes that he "fractured one of the ribs in the coughing spell." He has applied a back brace to provide support.

## 2018-06-09 LAB — CEA: CEA1: 1.7 ng/mL (ref 0.0–4.7)

## 2018-12-08 ENCOUNTER — Telehealth: Payer: Self-pay | Admitting: Internal Medicine

## 2018-12-08 ENCOUNTER — Ambulatory Visit: Admission: RE | Admit: 2018-12-08 | Payer: Medicare HMO | Source: Ambulatory Visit

## 2018-12-08 ENCOUNTER — Ambulatory Visit: Payer: Self-pay

## 2018-12-11 ENCOUNTER — Other Ambulatory Visit: Payer: Self-pay

## 2018-12-11 ENCOUNTER — Inpatient Hospital Stay (HOSPITAL_BASED_OUTPATIENT_CLINIC_OR_DEPARTMENT_OTHER): Payer: Medicare HMO | Admitting: Internal Medicine

## 2018-12-11 ENCOUNTER — Inpatient Hospital Stay: Payer: Medicare HMO | Attending: Internal Medicine

## 2018-12-11 ENCOUNTER — Encounter: Payer: Self-pay | Admitting: Internal Medicine

## 2018-12-11 DIAGNOSIS — C182 Malignant neoplasm of ascending colon: Secondary | ICD-10-CM | POA: Diagnosis present

## 2018-12-11 LAB — CBC WITH DIFFERENTIAL/PLATELET
Abs Immature Granulocytes: 0.02 10*3/uL (ref 0.00–0.07)
Basophils Absolute: 0.1 10*3/uL (ref 0.0–0.1)
Basophils Relative: 1 %
Eosinophils Absolute: 0.1 10*3/uL (ref 0.0–0.5)
Eosinophils Relative: 1 %
HCT: 46.1 % (ref 39.0–52.0)
Hemoglobin: 15.8 g/dL (ref 13.0–17.0)
Immature Granulocytes: 0 %
Lymphocytes Relative: 27 %
Lymphs Abs: 2.3 10*3/uL (ref 0.7–4.0)
MCH: 31.6 pg (ref 26.0–34.0)
MCHC: 34.3 g/dL (ref 30.0–36.0)
MCV: 92.2 fL (ref 80.0–100.0)
Monocytes Absolute: 0.6 10*3/uL (ref 0.1–1.0)
Monocytes Relative: 8 %
Neutro Abs: 5.4 10*3/uL (ref 1.7–7.7)
Neutrophils Relative %: 63 %
Platelets: 193 10*3/uL (ref 150–400)
RBC: 5 MIL/uL (ref 4.22–5.81)
RDW: 13.1 % (ref 11.5–15.5)
WBC: 8.6 10*3/uL (ref 4.0–10.5)
nRBC: 0 % (ref 0.0–0.2)

## 2018-12-11 LAB — COMPREHENSIVE METABOLIC PANEL
ALT: 51 U/L — ABNORMAL HIGH (ref 0–44)
AST: 39 U/L (ref 15–41)
Albumin: 4.2 g/dL (ref 3.5–5.0)
Alkaline Phosphatase: 66 U/L (ref 38–126)
Anion gap: 7 (ref 5–15)
BUN: 17 mg/dL (ref 8–23)
CO2: 25 mmol/L (ref 22–32)
Calcium: 9 mg/dL (ref 8.9–10.3)
Chloride: 107 mmol/L (ref 98–111)
Creatinine, Ser: 0.91 mg/dL (ref 0.61–1.24)
GFR calc Af Amer: >60 mL/min (ref >60)
GFR calc non Af Amer: >60 mL/min (ref >60)
Glucose, Bld: 93 mg/dL (ref 70–99)
Potassium: 4.9 mmol/L (ref 3.5–5.1)
Sodium: 139 mmol/L (ref 135–145)
Total Bilirubin: 0.9 mg/dL (ref 0.3–1.2)
Total Protein: 7.3 g/dL (ref 6.5–8.1)

## 2018-12-11 NOTE — Progress Notes (Signed)
I connected with Dorina Hoyer on 12/11/18 at  1:45 PM EDTby telephone and verified that I am speaking with the patient using 2 identifiers.  # LOCATION:  Patient home; provider: Office  I discussed the limitations, risks, security and privacy concerns of performing an evaluation and management service by telephone and the availability of in person appointments.  I also discussed with the patient that there may be a patient responsible charge related to the service.  The patient expressed understanding and agrees to proceed.  History of present illness:Philip Shah 63 y.o.  male with history of stage III colon cancer  Patient denies any nausea vomiting but denies any abdominal pain.  Chronic tingling and numbness in expertise.  Observation/objective: None  Assessment and plan: Cancer of ascending colon ([place]) # Ascending colon cancer; stage III  S/p  adjuvant chemotherapy with FOLFOX; s/p RT [finished feb 7th 2018+ margin].  October 2019 CT scan NED.  # clinically NED; labs CEA pending today.  Will hold off scans at this time/given COVID-19.  Will order CT scans at next visit in 3 months.  #Tingling and numbness-grade 1- Stable.   #Elevated blood pressure followed by PCP.   # DISPOSITION: Will call if labs are abnormal. # follow up in 6 months/ labs-CBC CMP CEA.-Dr.B     Follow-up instructions:  I discussed the assessment and treatment plan with the patient.  The patient was provided an opportunity to ask questions and all were answered.  The patient agreed with the plan and demonstrated understanding of instructions.  The patient was advised to call back or seek an in person evaluation if the symptoms worsen or if the condition fails to improve as anticipated.  I provided 6 minutes of non-face-to-face time during this encounter   Dr. Charlaine Dalton [facility] 12/11/2018 11:19 AM

## 2018-12-11 NOTE — Assessment & Plan Note (Addendum)
#   Ascending colon cancer; stage III  S/p  adjuvant chemotherapy with FOLFOX; s/p RT [finished feb 7th 2018+ margin].  October 2019 CT scan NED.  # clinically NED; labs CEA pending today.  Will hold off scans at this time/given COVID-19.  Will order CT scans at next visit in 3 months.  #Tingling and numbness-grade 1- Stable.   #Elevated blood pressure followed by PCP.   # DISPOSITION: Will call if labs are abnormal. # follow up in 6 months/ labs-CBC CMP CEA.-Dr.B

## 2019-03-14 ENCOUNTER — Encounter: Payer: Self-pay | Admitting: Radiation Oncology

## 2019-03-14 ENCOUNTER — Other Ambulatory Visit: Payer: Self-pay

## 2019-03-14 ENCOUNTER — Ambulatory Visit
Admission: RE | Admit: 2019-03-14 | Discharge: 2019-03-14 | Disposition: A | Discharge status: Home / Self Care | Payer: Medicare HMO | Source: Ambulatory Visit | Attending: Radiation Oncology | Admitting: Radiation Oncology

## 2019-03-14 VITALS — BP 128/83 | HR 76 | Temp 98.1°F | Resp 18 | Wt 194.7 lb

## 2019-03-14 DIAGNOSIS — G629 Polyneuropathy, unspecified: Secondary | ICD-10-CM | POA: Diagnosis not present

## 2019-03-14 DIAGNOSIS — C182 Malignant neoplasm of ascending colon: Secondary | ICD-10-CM

## 2019-03-14 DIAGNOSIS — Z85038 Personal history of other malignant neoplasm of large intestine: Secondary | ICD-10-CM | POA: Diagnosis present

## 2019-03-14 DIAGNOSIS — Z923 Personal history of irradiation: Secondary | ICD-10-CM | POA: Insufficient documentation

## 2019-03-14 NOTE — Progress Notes (Signed)
Radiation Oncology Follow up Note  Name: Philip Shah   Date:   03/14/2019 MRN:  [id number] DOB: 1956-01-21    This 63 y.o. male presents to the clinic today for 2-1/2-year follow-up status post adjuvant chemoradiation therapy for stage IIIa adenocarcinoma of the ascending colon.  REFERRING PROVIDER: Albina Billet, MD  HPI: Patient is a 63 year old male now about 2-1/2 years status post adjuvant radiation therapy for adenocarcinoma of the ascending colon stage IIIa (T4 N2 M0).  Seen today in routine follow-up he continues to do well specifically denies any abdominal pain or discomfort diarrhea.  He does have some significant.  Lower extremity neuropathy.  His last CT scan which I have reviewed shows stable surgical changes from right hemicolectomy and ileocolonic anastomosis.  No evidence of recurrent disease adenopathy or metastatic disease was noted.  His CEA has remained stable.  COMPLICATIONS OF TREATMENT: none  FOLLOW UP COMPLIANCE: keeps appointments   PHYSICAL EXAM:  BP 128/83 (BP Location: Left Arm, Patient Position: Sitting)   Pulse 76   Temp 98.1 F (36.7 C) (Tympanic)   Resp 18   Wt 194 lb 10.7 oz (88.3 kg)   BMI 31.42 kg/m  Well-developed well-nourished patient in NAD. HEENT reveals PERLA, EOMI, discs not visualized.  Oral cavity is clear. No oral mucosal lesions are identified. Neck is clear without evidence of cervical or supraclavicular adenopathy. Lungs are clear to A&P. Cardiac examination is essentially unremarkable with regular rate and rhythm without murmur rub or thrill. Abdomen is benign with no organomegaly or masses noted. Motor sensory and DTR levels are equal and symmetric in the upper and lower extremities. Cranial nerves II through XII are grossly intact. Proprioception is intact. No peripheral adenopathy or edema is identified. No motor or sensory levels are noted. Crude visual fields are within normal range.  RADIOLOGY RESULTS: CT scan reviewed  compatible with above-stated findings  PLAN: Present time patient is doing well with no evidence of disease 2-1/2 years out from colon surgery as well as adjuvant chemoradiation.  I am pleased with his overall progress.  I will let Dr. B order follow-up CT scans which I will review when they become available.  I have asked to see him back in 1 year for follow-up.  Patient knows to call at anytime with any concerns.  I would like to take this opportunity to thank you for allowing me to participate in the care of your patient.Noreene Filbert, MD

## 2019-04-11 ENCOUNTER — Encounter: Payer: Self-pay | Admitting: General Surgery

## 2019-06-12 ENCOUNTER — Inpatient Hospital Stay: Payer: Medicare HMO | Admitting: Internal Medicine

## 2019-06-12 ENCOUNTER — Inpatient Hospital Stay: Payer: Medicare HMO | Attending: Internal Medicine

## 2019-06-12 NOTE — Progress Notes (Deleted)
[facility] [place] NOTE  Patient Care Team: Albina Billet, MD as PCP - General (Internal Medicine) Marden Noble, MD (Internal Medicine) Christene Lye, MD (General Surgery) Cammie Sickle, MD as Consulting Physician (Internal Medicine)  CHIEF COMPLAINTS/PURPOSE OF CONSULTATION:  Oncology History Overview Note  # June-July 2017-Ascending COLON CA STAGE III [pT3pN2 (4/14LN positive); POSITIVE RADIAL MARGIN; Dr.Sankar]; pre-op CEA- 1.5/N; MRI- liver-NEG; 03/26/2016- PET-NED   # June 24th 2017-  FOLFOX q 2 W; s/p RT [? Positive margin- finished feb 2018]  # IDA- [aug 2017] s/p IV venofer x4.   # colonoscopy [Dr.Sankar; June 2018; repeat summer 2021 ]   # MOLECULAR TESTING: MSI-STABLE; F-ONE-[july 2017] B- RAF V 600 E; Wild type Kras/N-ras ; others** ------------------------------------------------------------------   DIAGNOSIS: COLON CA  STAGE: III        ;GOALS: cure  CURRENT/MOST RECENT THERAPY: Surveillance    Cancer of ascending colon (HCC)     HISTORY OF PRESENTING ILLNESS:  Philip Shah 63 y.o.  male above history of stage III colon cancer currently on surveillance is here for follow-up/review the results of the CT scan.  In the interim patient had his port taken out.  Also had cataract surgeries.  Patient states that he had episode of coughing spell approximately week ago; and felt his left rib " pop"; hurts with a deep breath.  Otherwise improving.  Is not on any pain medication.  Denies any worsening shortness of breath.   No nausea no vomiting.  No abdominal pain.  Mild tingling and numbness in his extremities.  Otherwise no blood in stools black stools.  Review of Systems  Constitutional: Negative for chills, diaphoresis, fever, malaise/fatigue and weight loss.  HENT: Negative for nosebleeds and sore throat.   Eyes: Negative for double vision.  Respiratory: Negative for cough, hemoptysis, sputum  production, shortness of breath and wheezing.   Cardiovascular: Negative for chest pain (Left chest wall pain.), palpitations, orthopnea and leg swelling.  Gastrointestinal: Negative for abdominal pain, blood in stool, constipation, diarrhea, heartburn, melena, nausea and vomiting.  Genitourinary: Negative for dysuria, frequency and urgency.  Musculoskeletal: Negative for back pain and joint pain.  Skin: Negative.  Negative for itching and rash.  Neurological: Positive for tingling. Negative for dizziness, focal weakness, weakness and headaches.  Endo/Heme/Allergies: Does not bruise/bleed easily.  Psychiatric/Behavioral: Negative for depression. The patient is not nervous/anxious and does not have insomnia.      MEDICAL HISTORY:  Past Medical History:  Diagnosis Date  . Abnormal colonoscopy 02-2016  . Anemia   . Colon cancer ([place])    colon ca, stage III  . Enlarged prostate   . GERD (gastroesophageal reflux disease)   . H/O vasectomy 66  . Hemorrhoids   . Hx of ligation of vein 1981  . Neuropathy    feet and finger tips, secondary to chemo treatments  . Sciatica   . Status post colon resection 02-26-16    SURGICAL HISTORY: Past Surgical History:  Procedure Laterality Date  . APPENDECTOMY    . CATARACT EXTRACTION W/PHACO Right 05/15/2018   Procedure: CATARACT EXTRACTION PHACO AND INTRAOCULAR LENS PLACEMENT ([place]) RIGHT;  Surgeon: Eulogio Bear, MD;  Location: [facility];  Service: Ophthalmology;  Laterality: Right;  . COLONOSCOPY WITH PROPOFOL N/A 02/11/2016   Procedure: COLONOSCOPY WITH PROPOFOL;  Surgeon: Christene Lye, MD;  Location: [facility] ENDOSCOPY;  Service: Endoscopy;  Laterality: N/A;  . COLONOSCOPY WITH PROPOFOL N/A 03/01/2017   Procedure:  COLONOSCOPY WITH PROPOFOL;  Surgeon: Christene Lye, MD;  Location: [facility] ENDOSCOPY;  Service: Endoscopy;  Laterality: N/A;  . ESOPHAGOGASTRODUODENOSCOPY (EGD) WITH PROPOFOL N/A 02/11/2016   Procedure:  ESOPHAGOGASTRODUODENOSCOPY (EGD) WITH PROPOFOL;  Surgeon: Christene Lye, MD;  Location: [facility] ENDOSCOPY;  Service: Endoscopy;  Laterality: N/A;  . LAPAROSCOPIC RIGHT COLECTOMY Right 02/26/2016   Procedure: LAPAROSCOPIC RIGHT COLECTOMY;  Surgeon: Christene Lye, MD;  Location: [facility] ORS;  Service: General;  Laterality: Right;  . PORTACATH PLACEMENT N/A 03/18/2016   Procedure: INSERTION PORT-A-CATH;  Surgeon: Christene Lye, MD;  Location: [facility] ORS;  Service: General;  Laterality: N/A;  . VASECTOMY    . 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  Social History   Socioeconomic History  . Marital status: Single    Spouse name: Not on file  . Number of children: Not on file  . Years of education: Not on file  . Highest education level: Not on file  Occupational History  . Not on file  Social Needs  . Financial resource strain: Not on file  . Food insecurity    Worry: Not on file    Inability: Not on file  . Transportation needs    Medical: Not on file    Non-medical: Not on file  Tobacco Use  . Smoking status: Never Smoker  . Smokeless tobacco: Never Used  Substance and Sexual Activity  . Alcohol use: Yes    Alcohol/week: 5.0 standard drinks    Types: 5 Cans of beer per week    Comment:    . Drug use: No  . Sexual activity: Not on file  Lifestyle  . Physical activity    Days per week: Not on file    Minutes per session: Not on file  . Stress: Not on file  Relationships  . Social Herbalist on phone: Not on file    Gets together: Not on file    Attends religious service: Not on file    Active member of club or organization: Not on file    Attends meetings of clubs or organizations: Not on file    Relationship status: Not on file  . Intimate partner violence    Fear of current or ex partner: Not on file    Emotionally abused: Not on file     Physically abused: Not on file    Forced sexual activity: Not on file  Other Topics Concern  . Not on file  Social History Narrative  . Not on file    FAMILY HISTORY: mat aunt- anal cancer; oldest brother/pre-cancerous polyps- [Dr. In [place]] Family History  Problem Relation Age of Onset  . Kidney failure Mother   . Cancer Father        spine    ALLERGIES:  is allergic to penicillins.  MEDICATIONS:  Current Outpatient Medications  Medication Sig Dispense Refill  . Multiple Vitamin (MULTIVITAMIN WITH MINERALS) TABS tablet Take 1 tablet by mouth daily.    . rosuvastatin (CRESTOR) 20 MG tablet     . Saw Palmetto, Serenoa repens, (SAW PALMETTO PO) Take 1 capsule by mouth daily.     Marland Kitchen zinc gluconate 50 MG tablet Take 50 mg by mouth daily.     No current facility-administered medications for this visit.    Facility-Administered Medications Ordered in Other Visits  Medication Dose Route Frequency Provider Last  Rate Last Dose  . sodium chloride flush (NS) 0.9 % injection 10 mL  10 mL Intravenous PRN Cammie Sickle, MD   10 mL at 05/04/17 0903      .  PHYSICAL EXAMINATION: ECOG PERFORMANCE STATUS: 0 - Asymptomatic  There were no vitals filed for this visit. There were no vitals filed for this visit.  Physical Exam  Constitutional: He is oriented to person, place, and time and well-developed, well-nourished, and in no distress.  HENT:  Head: Normocephalic and atraumatic.  Mouth/Throat: Oropharynx is clear and moist. No oropharyngeal exudate.  Eyes: Pupils are equal, round, and reactive to light.  Neck: Normal range of motion. Neck supple.  Cardiovascular: Normal rate and regular rhythm.  Pulmonary/Chest: No respiratory distress. He has no wheezes.  Abdominal: Soft. Bowel sounds are normal. He exhibits no distension and no mass. There is no abdominal tenderness. There is no rebound and no guarding.  Musculoskeletal: Normal range of motion.        General: No  tenderness or edema.  Neurological: He is alert and oriented to person, place, and time.  Skin: Skin is warm.  Psychiatric: Affect normal.    LABORATORY DATA:  I have reviewed the data as listed Lab Results  Component Value Date   WBC 8.6 12/11/2018   HGB 15.8 12/11/2018   HCT 46.1 12/11/2018   MCV 92.2 12/11/2018   PLT 193 12/11/2018   Recent Labs    12/11/18 1326  NA 139  K 4.9  CL 107  CO2 25  GLUCOSE 93  BUN 17  CREATININE 0.91  CALCIUM 9.0  GFRNONAA >60  GFRAA >60  PROT 7.3  ALBUMIN 4.2  AST 39  ALT 51*  ALKPHOS 66  BILITOT 0.9    RADIOGRAPHIC STUDIES: I have personally reviewed the radiological images as listed and agreed with the findings in the report. No results found. ASSESSMENT & PLAN:   No problem-specific Assessment & Plan notes found for this encounter.     Cammie Sickle, MD 06/12/2019 8:22 AM

## 2019-06-12 NOTE — Assessment & Plan Note (Deleted)
#   Ascending colon cancer; stage III  S/p  adjuvant chemotherapy with FOLFOX; s/p RT [finished feb 7th 2018+ margin].  October 2019 CT scan NED.  # clinically NED; labs CEA pending today.  Will hold off scans at this time/given COVID-19.  Will order CT scans at next visit in 3 months.  #Tingling and numbness-grade 1- Stable.   #Elevated blood pressure followed by PCP.   # DISPOSITION: Will call if labs are abnormal. # follow up in 6 months/ labs-CBC CMP CEA.-Dr.B  

## 2020-03-17 ENCOUNTER — Telehealth: Payer: Self-pay | Admitting: Internal Medicine

## 2020-03-17 DIAGNOSIS — C182 Malignant neoplasm of ascending colon: Secondary | ICD-10-CM

## 2020-03-17 NOTE — Telephone Encounter (Signed)
On 7/12-return patient's call regarding missed follow-up appointment.  Heather- please talk to pt re: plan below.   C- Please schedule CT scan chest and pelvis-the next 1 to 2 weeks; follow-up-MD; labs CBC CMP CEA 1 to 2 days later after the imaging.

## 2020-03-18 NOTE — Telephone Encounter (Signed)
Left voice mail for patient regarding the plan of care.

## 2020-03-18 NOTE — Addendum Note (Signed)
Addended by: Gloris Ham on: 03/18/2020 10:55 AM   Modules accepted: Orders

## 2020-03-20 ENCOUNTER — Other Ambulatory Visit: Payer: Self-pay

## 2020-03-20 ENCOUNTER — Ambulatory Visit
Admission: RE | Admit: 2020-03-20 | Discharge: 2020-03-20 | Disposition: A | Discharge status: Home / Self Care | Payer: Medicare HMO | Source: Ambulatory Visit | Attending: Radiation Oncology | Admitting: Radiation Oncology

## 2020-03-20 VITALS — BP 137/71 | HR 83 | Temp 97.2°F | Wt 210.0 lb

## 2020-03-20 DIAGNOSIS — Z923 Personal history of irradiation: Secondary | ICD-10-CM | POA: Diagnosis not present

## 2020-03-20 DIAGNOSIS — Z85038 Personal history of other malignant neoplasm of large intestine: Secondary | ICD-10-CM | POA: Insufficient documentation

## 2020-03-20 DIAGNOSIS — Z9221 Personal history of antineoplastic chemotherapy: Secondary | ICD-10-CM | POA: Diagnosis not present

## 2020-03-20 DIAGNOSIS — C182 Malignant neoplasm of ascending colon: Secondary | ICD-10-CM

## 2020-03-20 NOTE — Progress Notes (Signed)
Radiation Oncology Follow up Note  Name: Philip Shah   Date:   03/20/2020 MRN:  [id number] DOB: 1955/11/27    This 64 y.o. male presents to the clinic today for 3-1/2-year follow-up status post adjuvant chemoradiation therapy for stage IIIa adenocarcinoma the ascending colon.  REFERRING PROVIDER: Albina Billet, MD  HPI: Patient is a 64 year old male now out over 3 and half years having completed adjuvant chemoradiation therapy for stage IIIa adenocarcinoma the ascending colon (T4 N2 M0).  Seen today in routine follow-up.  He is seen today in routine follow-up he is doing well.  He specifically denies any increased bowel movements fatigue blood per stools.  He is scheduled for CT scan of the abdomen pelvis within the next several weeks which I will review when the become available.  His last CT scan which I reviewed back in 2019 shows stable surgical changes from right hemicolectomy and ileocolic anastomosis no findings of recurrent tumor local regional adenopathy or metastatic disease.  COMPLICATIONS OF TREATMENT: none  FOLLOW UP COMPLIANCE: keeps appointments   PHYSICAL EXAM:  BP 137/71    Pulse 83    Temp (!) 97.2 F (36.2 C) (Tympanic)    Wt 210 lb (95.3 kg)    BMI 33.89 kg/m  Well-developed well-nourished patient in NAD. HEENT reveals PERLA, EOMI, discs not visualized.  Oral cavity is clear. No oral mucosal lesions are identified. Neck is clear without evidence of cervical or supraclavicular adenopathy. Lungs are clear to A&P. Cardiac examination is essentially unremarkable with regular rate and rhythm without murmur rub or thrill. Abdomen is benign with no organomegaly or masses noted. Motor sensory and DTR levels are equal and symmetric in the upper and lower extremities. Cranial nerves II through XII are grossly intact. Proprioception is intact. No peripheral adenopathy or edema is identified. No motor or sensory levels are noted. Crude visual fields are within normal  range.  RADIOLOGY RESULTS: Previous CT scan reviewed will review CT scans when available the next several weeks  PLAN: Present time patient continues to do well with no evidence of disease now out over 3 and half years.  I am going to turn follow-up care over to medical oncology.  Should his films show any abnormality may reschedule a follow-up appointment for discussion.  Otherwise patient knows to call with any concerns at any time.  I would like to take this opportunity to thank you for allowing me to participate in the care of your patient.Noreene Filbert, MD

## 2020-03-26 ENCOUNTER — Inpatient Hospital Stay: Payer: Medicare HMO | Attending: Internal Medicine

## 2020-03-26 ENCOUNTER — Other Ambulatory Visit: Payer: Self-pay

## 2020-03-26 ENCOUNTER — Ambulatory Visit
Admission: RE | Admit: 2020-03-26 | Discharge: 2020-03-26 | Disposition: A | Discharge status: Home / Self Care | Payer: Medicare HMO | Source: Ambulatory Visit | Attending: Internal Medicine | Admitting: Internal Medicine

## 2020-03-26 DIAGNOSIS — Z9049 Acquired absence of other specified parts of digestive tract: Secondary | ICD-10-CM | POA: Diagnosis not present

## 2020-03-26 DIAGNOSIS — R911 Solitary pulmonary nodule: Secondary | ICD-10-CM | POA: Insufficient documentation

## 2020-03-26 DIAGNOSIS — R202 Paresthesia of skin: Secondary | ICD-10-CM | POA: Insufficient documentation

## 2020-03-26 DIAGNOSIS — C182 Malignant neoplasm of ascending colon: Secondary | ICD-10-CM | POA: Diagnosis not present

## 2020-03-26 DIAGNOSIS — Z9221 Personal history of antineoplastic chemotherapy: Secondary | ICD-10-CM | POA: Diagnosis not present

## 2020-03-26 LAB — CBC WITH DIFFERENTIAL/PLATELET
Abs Immature Granulocytes: 0.04 10*3/uL (ref 0.00–0.07)
Basophils Absolute: 0.1 10*3/uL (ref 0.0–0.1)
Basophils Relative: 1 %
Eosinophils Absolute: 0.1 10*3/uL (ref 0.0–0.5)
Eosinophils Relative: 1 %
HCT: 46.3 % (ref 39.0–52.0)
Hemoglobin: 16 g/dL (ref 13.0–17.0)
Immature Granulocytes: 0 %
Lymphocytes Relative: 19 %
Lymphs Abs: 2 10*3/uL (ref 0.7–4.0)
MCH: 31.5 pg (ref 26.0–34.0)
MCHC: 34.6 g/dL (ref 30.0–36.0)
MCV: 91.1 fL (ref 80.0–100.0)
Monocytes Absolute: 0.7 10*3/uL (ref 0.1–1.0)
Monocytes Relative: 7 %
Neutro Abs: 7.6 10*3/uL (ref 1.7–7.7)
Neutrophils Relative %: 72 %
Platelets: 177 10*3/uL (ref 150–400)
RBC: 5.08 MIL/uL (ref 4.22–5.81)
RDW: 13 % (ref 11.5–15.5)
WBC: 10.4 10*3/uL (ref 4.0–10.5)
nRBC: 0 % (ref 0.0–0.2)

## 2020-03-26 LAB — COMPREHENSIVE METABOLIC PANEL
ALT: 68 U/L — ABNORMAL HIGH (ref 0–44)
AST: 71 U/L — ABNORMAL HIGH (ref 15–41)
Albumin: 4.3 g/dL (ref 3.5–5.0)
Alkaline Phosphatase: 59 U/L (ref 38–126)
Anion gap: 10 (ref 5–15)
BUN: 16 mg/dL (ref 8–23)
CO2: 25 mmol/L (ref 22–32)
Calcium: 8.9 mg/dL (ref 8.9–10.3)
Chloride: 101 mmol/L (ref 98–111)
Creatinine, Ser: 0.93 mg/dL (ref 0.61–1.24)
GFR calc Af Amer: >60 mL/min (ref >60)
GFR calc non Af Amer: >60 mL/min (ref >60)
Glucose, Bld: 99 mg/dL (ref 70–99)
Potassium: 4.5 mmol/L (ref 3.5–5.1)
Sodium: 136 mmol/L (ref 135–145)
Total Bilirubin: 1.2 mg/dL (ref 0.3–1.2)
Total Protein: 7.7 g/dL (ref 6.5–8.1)

## 2020-03-26 MED ORDER — IOHEXOL 300 MG/ML  SOLN
100.0000 mL | Freq: Once | INTRAMUSCULAR | Status: AC | PRN
  Administered 2020-03-26: 100 mL via INTRAVENOUS

## 2020-03-27 ENCOUNTER — Encounter: Payer: Self-pay | Admitting: Internal Medicine

## 2020-03-27 ENCOUNTER — Inpatient Hospital Stay: Payer: Medicare HMO | Admitting: Internal Medicine

## 2020-03-27 VITALS — BP 143/83 | HR 81 | Temp 97.7°F | Resp 16 | Ht 66.0 in | Wt 208.0 lb

## 2020-03-27 DIAGNOSIS — C182 Malignant neoplasm of ascending colon: Secondary | ICD-10-CM

## 2020-03-27 DIAGNOSIS — R911 Solitary pulmonary nodule: Secondary | ICD-10-CM | POA: Diagnosis not present

## 2020-03-27 LAB — CEA: CEA: 1.5 ng/mL (ref 0.0–4.7)

## 2020-03-27 NOTE — Assessment & Plan Note (Addendum)
#   Ascending colon cancer; stage III  S/p  adjuvant chemotherapy with FOLFOX; s/p RT [finished feb 7th 2018+ margin].  STABLE.   # clinically NED; labs CEA-WNL. July 21st 2021-CT C/A/P-NED.   # RIGHT MEDIAL UPPER LOBE- GOO; infectious/inflammatory; unlikely malignancy.  Repeat scan in 3 months.    # LFTs- G-1 ? Fatty liver-recommend weight loss moderation of alcohol.  #Tingling and numbness-grade 1; recommend acupuncture; Hold off gabapentinSTABLE.   # Elevated blood pressure followed by PCP.   # DISPOSITION:  # referal to Dr.Byrnett- re: hx of colon cancer/surveilaince colonoscopy # follow up in 3 months/ labs-CBC CMP CEA.' CT scan chest prior--Dr.B  # I reviewed the blood work- with the patient in detail; also reviewed the imaging independently [as summarized above]; and with the patient in detail.

## 2020-03-27 NOTE — Progress Notes (Signed)
[facility] [place] NOTE  Patient Care Team: Albina Billet, MD as PCP - General (Internal Medicine) Marden Noble, MD (Internal Medicine) Christene Lye, MD (General Surgery) Cammie Sickle, MD as Consulting Physician (Internal Medicine)  CHIEF COMPLAINTS/PURPOSE OF CONSULTATION:  Oncology History Overview Note  # June-July 2017-Ascending COLON CA STAGE III [pT3pN2 (4/14LN positive); POSITIVE RADIAL MARGIN; Dr.Sankar]; pre-op CEA- 1.5/N; MRI- liver-NEG; 03/26/2016- PET-NED   # June 24th 2017-  FOLFOX q 2 W; s/p RT [? Positive margin- finished feb 2018]  # IDA- [aug 2017] s/p IV venofer x4.   # colonoscopy [Dr.Sankar; June 2018; repeat summer 2021 ]   # MOLECULAR TESTING: MSI-STABLE; F-ONE-[july 2017] B- RAF V 600 E; Wild type Kras/N-ras ; others** ------------------------------------------------------------------   DIAGNOSIS: COLON CA  STAGE: III        ;GOALS: cure  CURRENT/MOST RECENT THERAPY: Surveillance    Cancer of ascending colon (HCC)     HISTORY OF PRESENTING ILLNESS:  Philip Shah 64 y.o.  male above history of stage III colon cancer currently on surveillance is here for follow-up/review the results of the CT scan.  Patient denies any blood in stools or black-colored stools.  No nausea no vomiting.  No abdominal pain.  No worsening pain.  Continues to have tingling and numbness in his extremities.  Not interrupting his daily lifestyle.  Review of Systems  Constitutional: Negative for chills, diaphoresis, fever, malaise/fatigue and weight loss.  HENT: Negative for nosebleeds and sore throat.   Eyes: Negative for double vision.  Respiratory: Negative for cough, hemoptysis, sputum production, shortness of breath and wheezing.   Cardiovascular: Negative for chest pain (Left chest wall pain.), palpitations, orthopnea and leg swelling.  Gastrointestinal: Negative for abdominal pain, blood in stool,  constipation, diarrhea, heartburn, melena, nausea and vomiting.  Genitourinary: Negative for dysuria, frequency and urgency.  Musculoskeletal: Negative for back pain and joint pain.  Skin: Negative.  Negative for itching and rash.  Neurological: Positive for tingling. Negative for dizziness, focal weakness, weakness and headaches.  Endo/Heme/Allergies: Does not bruise/bleed easily.  Psychiatric/Behavioral: Negative for depression. The patient is not nervous/anxious and does not have insomnia.      MEDICAL HISTORY:  Past Medical History:  Diagnosis Date  . Abnormal colonoscopy 02-2016  . Anemia   . Colon cancer ([place])    colon ca, stage III  . Enlarged prostate   . GERD (gastroesophageal reflux disease)   . H/O vasectomy 16  . Hemorrhoids   . Hx of ligation of vein 1981  . Neuropathy    feet and finger tips, secondary to chemo treatments  . Sciatica   . Status post colon resection 02-26-16    SURGICAL HISTORY: Past Surgical History:  Procedure Laterality Date  . APPENDECTOMY    . CATARACT EXTRACTION W/PHACO Right 05/15/2018   Procedure: CATARACT EXTRACTION PHACO AND INTRAOCULAR LENS PLACEMENT ([place]) RIGHT;  Surgeon: Eulogio Bear, MD;  Location: [facility];  Service: Ophthalmology;  Laterality: Right;  . COLONOSCOPY WITH PROPOFOL N/A 02/11/2016   Procedure: COLONOSCOPY WITH PROPOFOL;  Surgeon: Christene Lye, MD;  Location: [facility];  Service: Endoscopy;  Laterality: N/A;  . COLONOSCOPY WITH PROPOFOL N/A 03/01/2017   Procedure: COLONOSCOPY WITH PROPOFOL;  Surgeon: Christene Lye, MD;  Location: [facility];  Service: Endoscopy;  Laterality: N/A;  . ESOPHAGOGASTRODUODENOSCOPY (EGD) WITH PROPOFOL N/A 02/11/2016   Procedure: ESOPHAGOGASTRODUODENOSCOPY (EGD) WITH PROPOFOL;  Surgeon: Christene Lye, MD;  Location: [facility];  Service: Endoscopy;  Laterality: N/A;  . LAPAROSCOPIC RIGHT COLECTOMY Right 02/26/2016   Procedure: LAPAROSCOPIC RIGHT  COLECTOMY;  Surgeon: Christene Lye, MD;  Location: [facility];  Service: General;  Laterality: Right;  . PORTACATH PLACEMENT N/A 03/18/2016   Procedure: INSERTION PORT-A-CATH;  Surgeon: Christene Lye, MD;  Location: [facility];  Service: General;  Laterality: N/A;  . VASECTOMY    . 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  Social History   Socioeconomic History  . Marital status: Single    Spouse name: Not on file  . Number of children: Not on file  . Years of education: Not on file  . Highest education level: Not on file  Occupational History  . Not on file  Tobacco Use  . Smoking status: Never Smoker  . Smokeless tobacco: Never Used  Vaping Use  . Vaping Use: Never used  Substance and Sexual Activity  . Alcohol use: Yes    Alcohol/week: 5.0 standard drinks    Types: 5 Cans of beer per week    Comment:    . Drug use: No  . Sexual activity: Not on file  Other Topics Concern  . Not on file  Social History Narrative  . Not on file   Social Determinants of Health   Financial Resource Strain:   . Difficulty of Paying Living Expenses:   Food Insecurity:   . Worried About Charity fundraiser in the Last Year:   . Arboriculturist in the Last Year:   Transportation Needs:   . Film/video editor (Medical):   Marland Kitchen Lack of Transportation (Non-Medical):   Physical Activity:   . Days of Exercise per Week:   . Minutes of Exercise per Session:   Stress:   . Feeling of Stress :   Social Connections:   . Frequency of Communication with Friends and Family:   . Frequency of Social Gatherings with Friends and Family:   . Attends Religious Services:   . Active Member of Clubs or Organizations:   . Attends Archivist Meetings:   Marland Kitchen Marital Status:   Intimate Partner Violence:   . Fear of Current or Ex-Partner:   . Emotionally Abused:   Marland Kitchen Physically  Abused:   . Sexually Abused:     FAMILY HISTORY: mat aunt- anal cancer; oldest brother/pre-cancerous polyps- [Dr. In [place]] Family History  Problem Relation Age of Onset  . Kidney failure Mother   . Cancer Father        spine    ALLERGIES:  is allergic to penicillins.  MEDICATIONS:  Current Outpatient Medications  Medication Sig Dispense Refill  . Multiple Vitamin (MULTIVITAMIN WITH MINERALS) TABS tablet Take 1 tablet by mouth daily.    . rosuvastatin (CRESTOR) 20 MG tablet     . Saw Palmetto, Serenoa repens, (SAW PALMETTO PO) Take 1 capsule by mouth daily.     Marland Kitchen zinc gluconate 50 MG tablet Take 50 mg by mouth daily.     No current facility-administered medications for this visit.   Facility-Administered Medications Ordered in Other Visits  Medication Dose Route Frequency Provider Last Rate Last Admin  . sodium chloride flush (NS) 0.9 % injection 10 mL  10 mL Intravenous PRN Cammie Sickle, MD   10 mL at 05/04/17 0903      .  PHYSICAL EXAMINATION: ECOG PERFORMANCE STATUS: 0 -  Asymptomatic  Vitals:   03/27/20 0955  BP: (!) 143/83  Pulse: 81  Resp: 16  Temp: 97.7 F (36.5 C)  SpO2: 97%   Filed Weights   03/27/20 0955  Weight: 208 lb (94.3 kg)    Physical Exam HENT:     Head: Normocephalic and atraumatic.     Mouth/Throat:     Pharynx: No oropharyngeal exudate.  Eyes:     Pupils: Pupils are equal, round, and reactive to light.  Cardiovascular:     Rate and Rhythm: Normal rate and regular rhythm.  Pulmonary:     Effort: No respiratory distress.     Breath sounds: No wheezing.  Abdominal:     General: Bowel sounds are normal. There is no distension.     Palpations: Abdomen is soft. There is no mass.     Tenderness: There is no abdominal tenderness. There is no guarding or rebound.  Musculoskeletal:        General: No tenderness. Normal range of motion.     Cervical back: Normal range of motion and neck supple.  Skin:    General: Skin is warm.   Neurological:     Mental Status: He is alert and oriented to person, place, and time.  Psychiatric:        Mood and Affect: Affect normal.     LABORATORY DATA:  I have reviewed the data as listed Lab Results  Component Value Date   WBC 10.4 03/26/2020   HGB 16.0 03/26/2020   HCT 46.3 03/26/2020   MCV 91.1 03/26/2020   PLT 177 03/26/2020   Recent Labs    03/26/20 1422  NA 136  K 4.5  CL 101  CO2 25  GLUCOSE 99  BUN 16  CREATININE 0.93  CALCIUM 8.9  GFRNONAA >60  GFRAA >60  PROT 7.7  ALBUMIN 4.3  AST 71*  ALT 68*  ALKPHOS 59  BILITOT 1.2    RADIOGRAPHIC STUDIES: I have personally reviewed the radiological images as listed and agreed with the findings in the report. CT Chest W Contrast  Result Date: 03/27/2020 CLINICAL DATA:  Colon cancer.  Restaging. EXAM: CT CHEST, ABDOMEN, AND PELVIS WITH CONTRAST TECHNIQUE: Multidetector CT imaging of the chest, abdomen and pelvis was performed following the standard protocol during bolus administration of intravenous contrast. CONTRAST:  144m OMNIPAQUE IOHEXOL 300 MG/ML  SOLN COMPARISON:  Abdomen/pelvis CT 06/06/2018.  Chest CT 07/14/2017. FINDINGS: CT CHEST FINDINGS Cardiovascular: The heart size is normal. No substantial pericardial effusion. Coronary artery calcification is evident. Atherosclerotic calcification is noted in the wall of the thoracic aorta. Mediastinum/Nodes: No mediastinal lymphadenopathy. There is no hilar lymphadenopathy. The esophagus has normal imaging features. There is no axillary lymphadenopathy. Lungs/Pleura: Subtle focus of ground-glass attenuation in the medial right upper lobe (67/3) is new in the interval and may reflect infectious/inflammatory alveolitis. No suspicious pulmonary nodule or mass. No focal airspace consolidation. No pleural effusion. Musculoskeletal: No worrisome lytic or sclerotic osseous abnormality. CT ABDOMEN PELVIS FINDINGS Hepatobiliary: No suspicious focal abnormality within the liver  parenchyma. There is no evidence for gallstones, gallbladder wall thickening, or pericholecystic fluid. No intrahepatic or extrahepatic biliary dilation. Pancreas: No focal mass lesion. No dilatation of the main duct. No intraparenchymal cyst. No peripancreatic edema. Spleen: No splenomegaly. No focal mass lesion. Adrenals/Urinary Tract: No adrenal nodule or mass. No suspicious enhancing renal lesion. Central sinus cysts noted bilaterally. No evidence for hydroureter. The urinary bladder appears normal for the degree of distention. Stomach/Bowel: Stomach is unremarkable.  No gastric wall thickening. No evidence of outlet obstruction. Duodenum is normally positioned as is the ligament of Treitz. No small bowel wall thickening. No small bowel dilatation. Status post right hemicolectomy No gross colonic mass. No colonic wall thickening. Vascular/Lymphatic: There is abdominal aortic atherosclerosis without aneurysm. There is no gastrohepatic or hepatoduodenal ligament lymphadenopathy. No retroperitoneal or mesenteric lymphadenopathy. No pelvic sidewall lymphadenopathy. Reproductive: Small focus of hyperenhancement identified posterior right prostate gland (117/2). Other: No intraperitoneal free fluid. Musculoskeletal: No worrisome lytic or sclerotic osseous abnormality. IMPRESSION: 1. No evidence for metastatic disease in the chest, abdomen, or pelvis. 2. Subtle focus of ground-glass attenuation in the medial right upper lobe is new in the interval and may reflect infectious/inflammatory alveolitis. Consider follow-up CT chest in 3 months to ensure resolution. 3. Small focus of hyperenhancement posterior right prostate gland. Correlation with PSA recommended. 4. Aortic Atherosclerosis (ICD10-I70.0). Electronically Signed   By: Misty Stanley M.D.   On: 03/27/2020 09:56   CT Abdomen Pelvis W Contrast  Result Date: 03/27/2020 CLINICAL DATA:  Colon cancer.  Restaging. EXAM: CT CHEST, ABDOMEN, AND PELVIS WITH CONTRAST  TECHNIQUE: Multidetector CT imaging of the chest, abdomen and pelvis was performed following the standard protocol during bolus administration of intravenous contrast. CONTRAST:  158m OMNIPAQUE IOHEXOL 300 MG/ML  SOLN COMPARISON:  Abdomen/pelvis CT 06/06/2018.  Chest CT 07/14/2017. FINDINGS: CT CHEST FINDINGS Cardiovascular: The heart size is normal. No substantial pericardial effusion. Coronary artery calcification is evident. Atherosclerotic calcification is noted in the wall of the thoracic aorta. Mediastinum/Nodes: No mediastinal lymphadenopathy. There is no hilar lymphadenopathy. The esophagus has normal imaging features. There is no axillary lymphadenopathy. Lungs/Pleura: Subtle focus of ground-glass attenuation in the medial right upper lobe (67/3) is new in the interval and may reflect infectious/inflammatory alveolitis. No suspicious pulmonary nodule or mass. No focal airspace consolidation. No pleural effusion. Musculoskeletal: No worrisome lytic or sclerotic osseous abnormality. CT ABDOMEN PELVIS FINDINGS Hepatobiliary: No suspicious focal abnormality within the liver parenchyma. There is no evidence for gallstones, gallbladder wall thickening, or pericholecystic fluid. No intrahepatic or extrahepatic biliary dilation. Pancreas: No focal mass lesion. No dilatation of the main duct. No intraparenchymal cyst. No peripancreatic edema. Spleen: No splenomegaly. No focal mass lesion. Adrenals/Urinary Tract: No adrenal nodule or mass. No suspicious enhancing renal lesion. Central sinus cysts noted bilaterally. No evidence for hydroureter. The urinary bladder appears normal for the degree of distention. Stomach/Bowel: Stomach is unremarkable. No gastric wall thickening. No evidence of outlet obstruction. Duodenum is normally positioned as is the ligament of Treitz. No small bowel wall thickening. No small bowel dilatation. Status post right hemicolectomy No gross colonic mass. No colonic wall thickening.  Vascular/Lymphatic: There is abdominal aortic atherosclerosis without aneurysm. There is no gastrohepatic or hepatoduodenal ligament lymphadenopathy. No retroperitoneal or mesenteric lymphadenopathy. No pelvic sidewall lymphadenopathy. Reproductive: Small focus of hyperenhancement identified posterior right prostate gland (117/2). Other: No intraperitoneal free fluid. Musculoskeletal: No worrisome lytic or sclerotic osseous abnormality. IMPRESSION: 1. No evidence for metastatic disease in the chest, abdomen, or pelvis. 2. Subtle focus of ground-glass attenuation in the medial right upper lobe is new in the interval and may reflect infectious/inflammatory alveolitis. Consider follow-up CT chest in 3 months to ensure resolution. 3. Small focus of hyperenhancement posterior right prostate gland. Correlation with PSA recommended. 4. Aortic Atherosclerosis (ICD10-I70.0). Electronically Signed   By: EMisty StanleyM.D.   On: 03/27/2020 09:56   ASSESSMENT & PLAN:   Cancer of ascending colon ([place] # Ascending colon cancer; stage III  S/p  adjuvant chemotherapy with FOLFOX; s/p RT [finished feb 7th 2018+ margin].  STABLE.   # clinically NED; labs CEA-WNL. July 21st 2021-CT C/A/P-NED.   # RIGHT MEDIAL UPPER LOBE- GOO; infectious/inflammatory; unlikely malignancy.  Repeat scan in 3 months.    # LFTs- G-1 ? Fatty liver-recommend weight loss moderation of alcohol.  #Tingling and numbness-grade 1; recommend acupuncture; Hold off gabapentinSTABLE.   # Elevated blood pressure followed by PCP.   # DISPOSITION:  # referal to Dr.Byrnett- re: hx of colon cancer/surveilaince colonoscopy # follow up in 3 months/ labs-CBC CMP CEA.' CT scan chest prior--Dr.B  # I reviewed the blood work- with the patient in detail; also reviewed the imaging independently [as summarized above]; and with the patient in detail.        Cammie Sickle, MD 03/27/2020 11:59 AM

## 2020-04-15 ENCOUNTER — Other Ambulatory Visit: Payer: Self-pay | Admitting: General Surgery

## 2020-05-05 ENCOUNTER — Other Ambulatory Visit: Payer: Self-pay

## 2020-05-05 ENCOUNTER — Other Ambulatory Visit
Admission: RE | Admit: 2020-05-05 | Discharge: 2020-05-05 | Disposition: A | Discharge status: Home / Self Care | Payer: Medicare HMO | Source: Ambulatory Visit | Attending: General Surgery | Admitting: General Surgery

## 2020-05-05 DIAGNOSIS — Z01812 Encounter for preprocedural laboratory examination: Secondary | ICD-10-CM | POA: Diagnosis present

## 2020-05-05 DIAGNOSIS — Z20822 Contact with and (suspected) exposure to covid-19: Secondary | ICD-10-CM | POA: Diagnosis not present

## 2020-05-05 LAB — SARS CORONAVIRUS 2 (TAT 6-24 HRS): SARS Coronavirus 2: NEGATIVE

## 2020-05-07 ENCOUNTER — Ambulatory Visit: Payer: Medicare HMO | Admitting: Anesthesiology

## 2020-05-07 ENCOUNTER — Ambulatory Visit
Admission: RE | Admit: 2020-05-07 | Discharge: 2020-05-07 | Disposition: A | Discharge status: Home / Self Care | Payer: Medicare HMO | Attending: General Surgery | Admitting: General Surgery

## 2020-05-07 ENCOUNTER — Other Ambulatory Visit: Payer: Self-pay

## 2020-05-07 ENCOUNTER — Encounter: Payer: Self-pay | Admitting: General Surgery

## 2020-05-07 ENCOUNTER — Encounter
Admission: RE | Disposition: A | Discharge status: Home / Self Care | Payer: Self-pay | Source: Home / Self Care | Attending: General Surgery

## 2020-05-07 DIAGNOSIS — Z9049 Acquired absence of other specified parts of digestive tract: Secondary | ICD-10-CM | POA: Insufficient documentation

## 2020-05-07 DIAGNOSIS — Z9221 Personal history of antineoplastic chemotherapy: Secondary | ICD-10-CM | POA: Diagnosis not present

## 2020-05-07 DIAGNOSIS — N400 Enlarged prostate without lower urinary tract symptoms: Secondary | ICD-10-CM | POA: Diagnosis not present

## 2020-05-07 DIAGNOSIS — K219 Gastro-esophageal reflux disease without esophagitis: Secondary | ICD-10-CM | POA: Insufficient documentation

## 2020-05-07 DIAGNOSIS — Z85038 Personal history of other malignant neoplasm of large intestine: Secondary | ICD-10-CM | POA: Diagnosis not present

## 2020-05-07 DIAGNOSIS — Z1211 Encounter for screening for malignant neoplasm of colon: Secondary | ICD-10-CM | POA: Insufficient documentation

## 2020-05-07 HISTORY — PX: COLONOSCOPY WITH PROPOFOL: SHX5780

## 2020-05-07 SURGERY — COLONOSCOPY WITH PROPOFOL
Anesthesia: General

## 2020-05-07 MED ORDER — PROPOFOL 10 MG/ML IV BOLUS
INTRAVENOUS | Status: DC | PRN
  Administered 2020-05-07: 80 mg via INTRAVENOUS

## 2020-05-07 MED ORDER — SODIUM CHLORIDE 0.9 % IV SOLN: INTRAVENOUS | Status: DC

## 2020-05-07 MED ORDER — PROPOFOL 500 MG/50ML IV EMUL
INTRAVENOUS | Status: DC | PRN
  Administered 2020-05-07: 200 ug/kg/min via INTRAVENOUS

## 2020-05-07 MED ORDER — PROPOFOL 500 MG/50ML IV EMUL
INTRAVENOUS | Status: AC
  Filled 2020-05-07: qty 50

## 2020-05-07 NOTE — Anesthesia Postprocedure Evaluation (Signed)
Anesthesia Post Note  Patient: Philip Shah  Procedure(s) Performed: COLONOSCOPY WITH PROPOFOL (N/A )  Patient location during evaluation: Endoscopy Anesthesia Type: General Level of consciousness: awake and alert Pain management: pain level controlled Vital Signs Assessment: post-procedure vital signs reviewed and stable Respiratory status: spontaneous breathing, nonlabored ventilation, respiratory function stable and patient connected to nasal cannula oxygen Cardiovascular status: blood pressure returned to baseline and stable Postop Assessment: no apparent nausea or vomiting Anesthetic complications: no   No complications documented.   Last Vitals:  Vitals:   05/07/20 0858 05/07/20 0918  BP:  120/85  Pulse:    Resp:    Temp:    SpO2: 96% 99%    Last Pain:  Vitals:   05/07/20 0918  TempSrc:   PainSc: 0-No pain                 Precious Haws Lam Bjorklund

## 2020-05-07 NOTE — Anesthesia Preprocedure Evaluation (Signed)
Anesthesia Evaluation  Patient identified by MRN, date of birth, ID band Patient awake    Reviewed: Allergy & Precautions, H&P , NPO status , Patient's Chart, lab work & pertinent test results  Airway Mallampati: III  TM Distance: <3 FB Neck ROM: limited    Dental  (+) Chipped, Poor Dentition   Pulmonary neg shortness of breath, sleep apnea ,    Pulmonary exam normal        Cardiovascular Exercise Tolerance: Good negative cardio ROS Normal cardiovascular exam     Neuro/Psych  Neuromuscular disease negative psych ROS   GI/Hepatic Neg liver ROS, GERD  Medicated and Controlled,  Endo/Other  negative endocrine ROS  Renal/GU negative Renal ROS  negative genitourinary   Musculoskeletal   Abdominal   Peds  Hematology negative hematology ROS (+)   Anesthesia Other Findings Past Medical History: 02-2016: Abnormal colonoscopy No date: Anemia No date: Colon cancer (HCC)     Comment:  colon ca, stage III No date: Enlarged prostate No date: GERD (gastroesophageal reflux disease) 1990: H/O vasectomy No date: Hemorrhoids 1981: Hx of ligation of vein No date: Neuropathy     Comment:  feet and finger tips, secondary to chemo treatments No date: Sciatica 02-26-16: Status post colon resection  Past Surgical History: No date: APPENDECTOMY 05/15/2018: CATARACT EXTRACTION W/PHACO; Right     Comment:  Procedure: CATARACT EXTRACTION PHACO AND INTRAOCULAR               LENS PLACEMENT ([place]) RIGHT;  Surgeon: Eulogio Bear,              MD;  Location: [place];  Service:               Ophthalmology;  Laterality: Right; 02/11/2016: COLONOSCOPY WITH PROPOFOL; N/A     Comment:  Procedure: COLONOSCOPY WITH PROPOFOL;  Surgeon:               Christene Lye, MD;  Location: [facility];                Service: Endoscopy;  Laterality: N/A; 03/01/2017: COLONOSCOPY WITH PROPOFOL; N/A     Comment:  Procedure: COLONOSCOPY  WITH PROPOFOL;  Surgeon: Christene Lye, MD;  Location: [facility];  Service:               Endoscopy;  Laterality: N/A; 02/11/2016: ESOPHAGOGASTRODUODENOSCOPY (EGD) WITH PROPOFOL; N/A     Comment:  Procedure: ESOPHAGOGASTRODUODENOSCOPY (EGD) WITH               PROPOFOL;  Surgeon: Christene Lye, MD;  Location:              [facility];  Service: Endoscopy;  Laterality: N/A; 02/26/2016: LAPAROSCOPIC RIGHT COLECTOMY; Right     Comment:  Procedure: LAPAROSCOPIC RIGHT COLECTOMY;  Surgeon:               Christene Lye, MD;  Location: [facility];  Service:              General;  Laterality: Right; 03/18/2016: PORTACATH PLACEMENT; N/A     Comment:  Procedure: INSERTION PORT-A-CATH;  Surgeon: Christene Lye, MD;  Location: [facility];  Service: General;                Laterality: N/A; No date: VASECTOMY 1980's: VEIN LIGATION;  Right     Comment:  Dr Pat Kocher  BMI    Body Mass Index: 33.09 kg/m      Reproductive/Obstetrics negative OB ROS                             Anesthesia Physical Anesthesia Plan  ASA: III  Anesthesia Plan: General   Post-op Pain Management:    Induction: Intravenous  PONV Risk Score and Plan: Propofol infusion and TIVA  Airway Management Planned: Natural Airway and Nasal Cannula  Additional Equipment:   Intra-op Plan:   Post-operative Plan:   Informed Consent: I have reviewed the patients History and Physical, chart, labs and discussed the procedure including the risks, benefits and alternatives for the proposed anesthesia with the patient or authorized representative who has indicated his/her understanding and acceptance.     Dental Advisory Given  Plan Discussed with: Anesthesiologist, CRNA and Surgeon  Anesthesia Plan Comments: (Patient consented for risks of anesthesia including but not limited to:  - adverse reactions to medications - risk of intubation if  required - damage to eyes, teeth, lips or other oral mucosa - nerve damage due to positioning  - sore throat or hoarseness - Damage to heart, brain, nerves, lungs, other parts of body or loss of life  Patient voiced understanding.)        Anesthesia Quick Evaluation

## 2020-05-07 NOTE — Op Note (Signed)
[facility] Patient Name: Philip Shah Procedure Date: 05/07/2020 7:59 AM MRN: [id number] Account #: [id number] Date of Birth: February 11, 1956 Admit Type: Outpatient Age: 64 Room: [facility] ENDO ROOM 1 Gender: Male Note Status: Finalized Procedure:             Colonoscopy Indications:           High risk colon cancer surveillance: Personal history                         of colon cancer Providers:             Robert Bellow, MD Referring MD:          Leona Carry. Hall Busing, MD (Referring MD) Medicines:             Monitored Anesthesia Care Complications:         No immediate complications. Procedure:             Pre-Anesthesia Assessment:                        - Prior to the procedure, a History and Physical was                         performed, and patient medications, allergies and                         sensitivities were reviewed. The patient's tolerance                         of previous anesthesia was reviewed.                        - The risks and benefits of the procedure and the                         sedation options and risks were discussed with the                         patient. All questions were answered and informed                         consent was obtained.                        After obtaining informed consent, the colonoscope was                         passed under direct vision. Throughout the procedure,                         the patient's blood pressure, pulse, and oxygen                         saturations were monitored continuously. The                         Colonoscope was introduced through the anus and                         advanced to the the ileocolonic  anastomosis. The                         colonoscopy was performed without difficulty. The                         patient tolerated the procedure well. The quality of                         the bowel preparation was good. Findings:      The entire examined  colon appeared normal on direct and retroflexion       views. Impression:            - The entire examined colon is normal on direct and                         retroflexion views.                        - No specimens collected. Recommendation:        - Repeat colonoscopy in 5 years for surveillance. Procedure Code(s):     --- Professional ---                        [phone], Colonoscopy, flexible; diagnostic, including                         collection of specimen(s) by brushing or washing, when                         performed (separate procedure) Diagnosis Code(s):     --- Professional ---                        H41.740, Personal history of other malignant neoplasm                         of large intestine CPT copyright 2019 [employer]. All rights reserved. The codes documented in this report are preliminary and upon coder review may  be revised to meet current compliance requirements. Robert Bellow, MD 05/07/2020 8:45:06 AM This report has been signed electronically. Number of Addenda: 0 Note Initiated On: 05/07/2020 7:59 AM Scope Withdrawal Time: 0 hours 6 minutes 53 seconds  Total Procedure Duration: 0 hours 10 minutes 24 seconds       [facility]

## 2020-05-07 NOTE — Anesthesia Procedure Notes (Signed)
Date/Time: 05/07/2020 8:36 AM Performed by: Nelda Marseille, CRNA Pre-anesthesia Checklist: Patient identified, Emergency Drugs available, Suction available, Patient being monitored and Timeout performed Oxygen Delivery Method: Nasal cannula

## 2020-05-07 NOTE — H&P (Signed)
Patient ID: Philip Shah. Philip Shah is a 64 y.o. male.    No change in clinical history or exam since office visit.    HPI  The following portions of the patient's history were reviewed and updated as appropriate.  This a new patient is here today for: office visit. Here to discuss having a colonoscopy referred by Dr Oneita Kras.  Review of Systems  Constitutional: Negative for chills and fever.  Respiratory: Negative for cough.    Objective:  Physical Exam Constitutional:  Appearance: Normal appearance.  Cardiovascular:  Rate and Rhythm: Normal rate and regular rhythm.  Heart sounds: Murmur (II?VI over aortic outflow tract. ) heard.   Pulmonary:  Effort: Pulmonary effort is normal.  Breath sounds: Normal breath sounds.  Abdominal:  General: Abdomen is flat.  Palpations: Abdomen is soft.   Musculoskeletal:  Cervical back: Normal range of motion and neck supple.  Skin: General: Skin is warm and dry.  Neurological:  Mental Status: He is alert and oriented to person, place, and time.  Psychiatric:  Mood and Affect: Mood normal.  Behavior: Behavior normal.   The patient underwent a right hemicolectomy for a large tumor encompassing 75% of the lumen. Review of the pathology suggested gross nodal disease but no count of lymph nodes appreciated. He received adjuvant chemotherapy and radiation therapy due to a positive radial margin..  Colonoscopy in 2018 was unremarkable. Images reviewed.  Assessment:   4 years status post right colectomy for a stage III carcinoma.  Plan:   The patient is a candidate for repeat colonoscopy. Procedure for appropriate preparation was reviewed with the patient by the staff.  Entered by Karie Fetch, RN, acting as a scribe for Dr. Hervey Ard, MD.  The documentation recorded by the scribe accurately reflects the service I personally performed and the decisions made by me.   Robert Bellow, MD FACS

## 2020-05-07 NOTE — Transfer of Care (Signed)
Immediate Anesthesia Transfer of Care Note  Patient: Philip Shah  Procedure(s) Performed: COLONOSCOPY WITH PROPOFOL (N/A )  Patient Location: PACU  Anesthesia Type:General  Level of Consciousness: awake and sedated  Airway & Oxygen Therapy: Patient Spontanous Breathing and Patient connected to nasal cannula oxygen  Post-op Assessment: Report given to RN and Post -op Vital signs reviewed and stable  Post vital signs: Reviewed and stable  Last Vitals:  Vitals Value Taken Time  BP 123/73 05/07/20 0848  Temp 36.1 C 05/07/20 0848  Pulse 87 05/07/20 0855  Resp 22 05/07/20 0855  SpO2 93 % 05/07/20 0855  Vitals shown include unvalidated device data.  Last Pain:  Vitals:   05/07/20 0848  TempSrc: Temporal  PainSc: 0-No pain         Complications: No complications documented.

## 2020-05-08 ENCOUNTER — Encounter: Payer: Self-pay | Admitting: General Surgery

## 2020-06-19 ENCOUNTER — Telehealth: Payer: Self-pay | Admitting: *Deleted

## 2020-06-19 NOTE — Telephone Encounter (Signed)
Per ct, patient came to ct today to pick up a prep kit. Only order in system is ct scan of chest. RN Clarified with Dr. B that md only wanted ct chest. Ct dept informed CT chest is only needed.

## 2020-06-24 ENCOUNTER — Ambulatory Visit
Admission: RE | Admit: 2020-06-24 | Discharge: 2020-06-24 | Disposition: A | Discharge status: Home / Self Care | Payer: Medicare HMO | Source: Ambulatory Visit | Attending: Internal Medicine | Admitting: Internal Medicine

## 2020-06-24 ENCOUNTER — Other Ambulatory Visit: Payer: Self-pay

## 2020-06-24 DIAGNOSIS — R911 Solitary pulmonary nodule: Secondary | ICD-10-CM | POA: Insufficient documentation

## 2020-06-24 DIAGNOSIS — C182 Malignant neoplasm of ascending colon: Secondary | ICD-10-CM

## 2020-06-24 LAB — POCT I-STAT CREATININE: Creatinine, Ser: 0.8 mg/dL (ref 0.61–1.24)

## 2020-06-24 MED ORDER — IOHEXOL 300 MG/ML  SOLN
75.0000 mL | Freq: Once | INTRAMUSCULAR | Status: AC | PRN
  Administered 2020-06-24: 75 mL via INTRAVENOUS

## 2020-06-30 ENCOUNTER — Inpatient Hospital Stay: Payer: Medicare HMO | Attending: Internal Medicine | Admitting: Internal Medicine

## 2020-06-30 ENCOUNTER — Inpatient Hospital Stay: Payer: Medicare HMO

## 2020-06-30 ENCOUNTER — Encounter: Payer: Self-pay | Admitting: Internal Medicine

## 2020-06-30 ENCOUNTER — Other Ambulatory Visit: Payer: Self-pay

## 2020-06-30 DIAGNOSIS — C182 Malignant neoplasm of ascending colon: Secondary | ICD-10-CM | POA: Diagnosis not present

## 2020-06-30 DIAGNOSIS — R200 Anesthesia of skin: Secondary | ICD-10-CM | POA: Insufficient documentation

## 2020-06-30 DIAGNOSIS — R030 Elevated blood-pressure reading, without diagnosis of hypertension: Secondary | ICD-10-CM | POA: Insufficient documentation

## 2020-06-30 DIAGNOSIS — R202 Paresthesia of skin: Secondary | ICD-10-CM | POA: Insufficient documentation

## 2020-06-30 DIAGNOSIS — R911 Solitary pulmonary nodule: Secondary | ICD-10-CM

## 2020-06-30 DIAGNOSIS — Z9221 Personal history of antineoplastic chemotherapy: Secondary | ICD-10-CM | POA: Insufficient documentation

## 2020-06-30 LAB — CBC WITH DIFFERENTIAL/PLATELET
Abs Immature Granulocytes: 0.06 10*3/uL (ref 0.00–0.07)
Basophils Absolute: 0.1 10*3/uL (ref 0.0–0.1)
Basophils Relative: 1 %
Eosinophils Absolute: 0.1 10*3/uL (ref 0.0–0.5)
Eosinophils Relative: 1 %
HCT: 46.4 % (ref 39.0–52.0)
Hemoglobin: 16.1 g/dL (ref 13.0–17.0)
Immature Granulocytes: 1 %
Lymphocytes Relative: 18 %
Lymphs Abs: 1.6 10*3/uL (ref 0.7–4.0)
MCH: 32.1 pg (ref 26.0–34.0)
MCHC: 34.7 g/dL (ref 30.0–36.0)
MCV: 92.6 fL (ref 80.0–100.0)
Monocytes Absolute: 0.6 10*3/uL (ref 0.1–1.0)
Monocytes Relative: 7 %
Neutro Abs: 6.4 10*3/uL (ref 1.7–7.7)
Neutrophils Relative %: 72 %
Platelets: 176 10*3/uL (ref 150–400)
RBC: 5.01 MIL/uL (ref 4.22–5.81)
RDW: 12.8 % (ref 11.5–15.5)
WBC: 8.7 10*3/uL (ref 4.0–10.5)
nRBC: 0 % (ref 0.0–0.2)

## 2020-06-30 LAB — COMPREHENSIVE METABOLIC PANEL
ALT: 69 U/L — ABNORMAL HIGH (ref 0–44)
AST: 74 U/L — ABNORMAL HIGH (ref 15–41)
Albumin: 4.1 g/dL (ref 3.5–5.0)
Alkaline Phosphatase: 56 U/L (ref 38–126)
Anion gap: 8 (ref 5–15)
BUN: 16 mg/dL (ref 8–23)
CO2: 27 mmol/L (ref 22–32)
Calcium: 8.4 mg/dL — ABNORMAL LOW (ref 8.9–10.3)
Chloride: 101 mmol/L (ref 98–111)
Creatinine, Ser: 1.01 mg/dL (ref 0.61–1.24)
GFR, Estimated: >60 mL/min (ref >60)
Glucose, Bld: 123 mg/dL — ABNORMAL HIGH (ref 70–99)
Potassium: 4.4 mmol/L (ref 3.5–5.1)
Sodium: 136 mmol/L (ref 135–145)
Total Bilirubin: 1 mg/dL (ref 0.3–1.2)
Total Protein: 7.4 g/dL (ref 6.5–8.1)

## 2020-06-30 NOTE — Progress Notes (Signed)
[facility] NOTE  Patient Care Team: Albina Billet, MD as PCP - General (Internal Medicine) Marden Noble, MD (Internal Medicine) Christene Lye, MD (General Surgery) Cammie Sickle, MD as Consulting Physician (Internal Medicine)  CHIEF COMPLAINTS/PURPOSE OF CONSULTATION:  Oncology History Overview Note  # June-July 2017-Ascending COLON CA STAGE III [pT3pN2 (4/14LN positive); POSITIVE RADIAL MARGIN; Dr.Sankar]; pre-op CEA- 1.5/N; MRI- liver-NEG; 03/26/2016- PET-NED   # June 24th 2017-  FOLFOX q 2 W; s/p RT [? Positive margin- finished feb 2018]  # IDA- [aug 2017] s/p IV venofer x4.   # colonoscopy [Dr.Sankar; June 2018; repeat summer 2021 ]   # MOLECULAR TESTING: MSI-STABLE; F-ONE-[july 2017] B- RAF V 600 E; Wild type Kras/N-ras ; others** ------------------------------------------------------------------   DIAGNOSIS: COLON CA  STAGE: III        ;GOALS: cure  CURRENT/MOST RECENT THERAPY: Surveillance    Cancer of ascending colon (HCC)     HISTORY OF PRESENTING ILLNESS:  Philip Shah 64 y.o.  male above history of stage III colon cancer currently on surveillance is here for follow-up/review the results of the CT scan.  In the interim patient underwent colonoscopy/also explantation of his port.  He denies any blood in stools or black-colored stools but no nausea no vomiting.  Denies any abdominal pain.  Any unusual cough or shortness of breath.  He continues to have intermittent tingling numbness in extremities.  Not any worse.   Review of Systems  Constitutional: Negative for chills, diaphoresis, fever, malaise/fatigue and weight loss.  HENT: Negative for nosebleeds and sore throat.   Eyes: Negative for double vision.  Respiratory: Negative for cough, hemoptysis, sputum production, shortness of breath and wheezing.   Cardiovascular: Negative for chest pain (Left chest wall pain.), palpitations, orthopnea  and leg swelling.  Gastrointestinal: Negative for abdominal pain, blood in stool, constipation, diarrhea, heartburn, melena, nausea and vomiting.  Genitourinary: Negative for dysuria, frequency and urgency.  Musculoskeletal: Negative for back pain and joint pain.  Skin: Negative.  Negative for itching and rash.  Neurological: Positive for tingling. Negative for dizziness, focal weakness, weakness and headaches.  Endo/Heme/Allergies: Does not bruise/bleed easily.  Psychiatric/Behavioral: Negative for depression. The patient is not nervous/anxious and does not have insomnia.      MEDICAL HISTORY:  Past Medical History:  Diagnosis Date  . Abnormal colonoscopy 02-2016  . Anemia   . Colon cancer ([place])    colon ca, stage III  . Enlarged prostate   . GERD (gastroesophageal reflux disease)   . H/O vasectomy 54  . Hemorrhoids   . Hx of ligation of vein 1981  . Neuropathy    feet and finger tips, secondary to chemo treatments  . Sciatica   . Status post colon resection 02-26-16    SURGICAL HISTORY: Past Surgical History:  Procedure Laterality Date  . APPENDECTOMY    . CATARACT EXTRACTION W/PHACO Right 05/15/2018   Procedure: CATARACT EXTRACTION PHACO AND INTRAOCULAR LENS PLACEMENT ([place]) RIGHT;  Surgeon: Eulogio Bear, MD;  Location: [facility];  Service: Ophthalmology;  Laterality: Right;  . COLONOSCOPY WITH PROPOFOL N/A 02/11/2016   Procedure: COLONOSCOPY WITH PROPOFOL;  Surgeon: Christene Lye, MD;  Location: [facility] ENDOSCOPY;  Service: Endoscopy;  Laterality: N/A;  . COLONOSCOPY WITH PROPOFOL N/A 03/01/2017   Procedure: COLONOSCOPY WITH PROPOFOL;  Surgeon: Christene Lye, MD;  Location: [facility] ENDOSCOPY;  Service: Endoscopy;  Laterality: N/A;  . COLONOSCOPY WITH PROPOFOL N/A 05/07/2020   Procedure:  COLONOSCOPY WITH PROPOFOL;  Surgeon: Robert Bellow, MD;  Location: [facility] ENDOSCOPY;  Service: Endoscopy;  Laterality: N/A;  . ESOPHAGOGASTRODUODENOSCOPY (EGD) WITH  PROPOFOL N/A 02/11/2016   Procedure: ESOPHAGOGASTRODUODENOSCOPY (EGD) WITH PROPOFOL;  Surgeon: Christene Lye, MD;  Location: [facility] ENDOSCOPY;  Service: Endoscopy;  Laterality: N/A;  . LAPAROSCOPIC RIGHT COLECTOMY Right 02/26/2016   Procedure: LAPAROSCOPIC RIGHT COLECTOMY;  Surgeon: Christene Lye, MD;  Location: [facility] OR[facility];  Service: General;  Laterality: Right;  . PORTACATH PLACEMENT N/A 03/18/2016   Procedure: INSERTION PORT-A-CATH;  Surgeon: Christene Lye, MD;  Location: [facility] OR[facility];  Service: General;  Laterality: N/A;  . VASECTOMY    . 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  Social History   Socioeconomic History  . Marital status: Single    Spouse name: Not on file  . Number of children: Not on file  . Years of education: Not on file  . Highest education level: Not on file  Occupational History  . Not on file  Tobacco Use  . Smoking status: Never Smoker  . Smokeless tobacco: Never Used  Vaping Use  . Vaping Use: Never used  Substance and Sexual Activity  . Alcohol use: Yes    Alcohol/week: 5.0 standard drinks    Types: 5 Cans of beer per week    Comment: none last 24hrs  . Drug use: No  . Sexual activity: Not on file  Other Topics Concern  . Not on file  Social History Narrative  . Not on file   Social Determinants of Health   Financial Resource Strain:   . Difficulty of Paying Living Expenses: Not on file  Food Insecurity:   . Worried About Charity fundraiser in the Last Year: Not on file  . Ran Out of Food in the Last Year: Not on file  Transportation Needs:   . Lack of Transportation (Medical): Not on file  . Lack of Transportation (Non-Medical): Not on file  Physical Activity:   . Days of Exercise per Week: Not on file  . Minutes of Exercise per Session: Not on file  Stress:   . Feeling of Stress : Not on file  Social Connections:    . Frequency of Communication with Friends and Family: Not on file  . Frequency of Social Gatherings with Friends and Family: Not on file  . Attends Religious Services: Not on file  . Active Member of Clubs or Organizations: Not on file  . Attends Archivist Meetings: Not on file  . Marital Status: Not on file  Intimate Partner Violence:   . Fear of Current or Ex-Partner: Not on file  . Emotionally Abused: Not on file  . Physically Abused: Not on file  . Sexually Abused: Not on file    FAMILY HISTORY: mat aunt- anal cancer; oldest brother/pre-cancerous polyps- [Dr. In [place]] Family History  Problem Relation Age of Onset  . Kidney failure Mother   . Cancer Father        spine    ALLERGIES:  is allergic to penicillins.  MEDICATIONS:  Current Outpatient Medications  Medication Sig Dispense Refill  . Multiple Vitamin (MULTIVITAMIN WITH MINERALS) TABS tablet Take 1 tablet by mouth daily.    . rosuvastatin (CRESTOR) 20 MG tablet     . Saw Palmetto, Serenoa repens, (SAW PALMETTO PO) Take 1 capsule by mouth daily.     Marland Kitchen  zinc gluconate 50 MG tablet Take 50 mg by mouth daily.     No current facility-administered medications for this visit.   Facility-Administered Medications Ordered in Other Visits  Medication Dose Route Frequency Provider Last Rate Last Admin  . sodium chloride flush (NS) 0.9 % injection 10 mL  10 mL Intravenous PRN Cammie Sickle, MD   10 mL at 05/04/17 0903      .  PHYSICAL EXAMINATION: ECOG PERFORMANCE STATUS: 0 - Asymptomatic  Vitals:   06/30/20 0928  BP: (!) 167/85  Pulse: 82  Resp: 18  Temp: 98.8 F (37.1 C)  SpO2: 99%   Filed Weights   06/30/20 0928  Weight: 211 lb (95.7 kg)    Physical Exam HENT:     Head: Normocephalic and atraumatic.     Mouth/Throat:     Pharynx: No oropharyngeal exudate.  Eyes:     Pupils: Pupils are equal, round, and reactive to light.  Cardiovascular:     Rate and Rhythm: Normal rate and  regular rhythm.  Pulmonary:     Effort: No respiratory distress.     Breath sounds: No wheezing.  Abdominal:     General: Bowel sounds are normal. There is no distension.     Palpations: Abdomen is soft. There is no mass.     Tenderness: There is no abdominal tenderness. There is no guarding or rebound.  Musculoskeletal:        General: No tenderness. Normal range of motion.     Cervical back: Normal range of motion and neck supple.  Skin:    General: Skin is warm.  Neurological:     Mental Status: He is alert and oriented to person, place, and time.  Psychiatric:        Mood and Affect: Affect normal.     LABORATORY DATA:  I have reviewed the data as listed Lab Results  Component Value Date   WBC 8.7 06/30/2020   HGB 16.1 06/30/2020   HCT 46.4 06/30/2020   MCV 92.6 06/30/2020   PLT 176 06/30/2020   Recent Labs    03/26/20 1422 06/24/20 0936 06/30/20 0909  NA 136  --  136  K 4.5  --  4.4  CL 101  --  101  CO2 25  --  27  GLUCOSE 99  --  123*  BUN 16  --  16  CREATININE 0.93 0.80 1.01  CALCIUM 8.9  --  8.4*  GFRNONAA >60  --  >60  GFRAA >60  --   --   PROT 7.7  --  7.4  ALBUMIN 4.3  --  4.1  AST 71*  --  74*  ALT 68*  --  69*  ALKPHOS 59  --  56  BILITOT 1.2  --  1.0    RADIOGRAPHIC STUDIES: I have personally reviewed the radiological images as listed and agreed with the findings in the report. CT Chest W Contrast  Result Date: 06/24/2020 CLINICAL DATA:  Follow-up indeterminate right lung nodule. Personal history of colon carcinoma. EXAM: CT CHEST WITH CONTRAST TECHNIQUE: Multidetector CT imaging of the chest was performed during intravenous contrast administration. CONTRAST:  73m OMNIPAQUE IOHEXOL 300 MG/ML  SOLN COMPARISON:  03/26/2020 FINDINGS: Cardiovascular: No acute findings. Aortic and coronary atherosclerotic calcification noted. Mediastinum/Nodes: No masses or pathologically enlarged lymph nodes identified. Lungs/Pleura: Previously seen area  ground-glass opacity in the medial right upper lobe has resolved since previous study, consistent with resolving infectious/inflammatory process. No suspicious nodules or masses  identified. No evidence of pulmonary infiltrate or pleural effusion. Upper Abdomen:  Hepatic steatosis again noted. Musculoskeletal:  No suspicious bone lesions. IMPRESSION: Interval resolution of right upper lobe ground-glass opacity, consistent with resolving infectious/inflammatory process. No active disease within the thorax. Hepatic steatosis. Aortic Atherosclerosis (ICD10-I70.0). Electronically Signed   By: Marlaine Hind M.D.   On: 06/24/2020 14:14   ASSESSMENT & PLAN:   Cancer of ascending colon ([place]) # Ascending colon cancer; stage III  S/p  adjuvant chemotherapy with FOLFOX; s/p RT [finished feb 7th 2018+ margin]. July 21st 2021-CT C/A/P-NED-stable clinically NED.  We will plan annual imaging moving forward.  Summer 2021-colonoscopy normal.  # RIGHT MEDIAL UPPER LOBE [july2021]- GOO; Oct 2021- CT- resolution of GOO.  # LFTs- G-1 ? Fatty liver-STABLE; recommend weight loss/ moderation of alcohol; eating healthy.   #Tingling and numbness-grade 1; recommend acupuncture; Hold off gabapentin- STABLE.   # Elevated blood pressure-currently- 446K systolic; 863O at home;  followed by PCP.   # DISPOSITION:  # follow up in 6 months/ labs-CBC CMP CEA-Dr.B  # I reviewed the blood work- with the patient in detail; also reviewed the imaging independently [as summarized above]; and with the patient in detail.         Cammie Sickle, MD 07/01/2020 7:43 AM

## 2020-06-30 NOTE — Assessment & Plan Note (Addendum)
#   Ascending colon cancer; stage III  S/p  adjuvant chemotherapy with FOLFOX; s/p RT [finished feb 7th 2018+ margin]. July 21st 2021-CT C/A/P-NED-stable clinically NED.  We will plan annual imaging moving forward.  Summer 2021-colonoscopy normal.  # RIGHT MEDIAL UPPER LOBE [july2021]- GOO; Oct 2021- CT- resolution of GOO.  # LFTs- G-1 ? Fatty liver-STABLE; recommend weight loss/ moderation of alcohol; eating healthy.   #Tingling and numbness-grade 1; recommend acupuncture; Hold off gabapentin- STABLE.   # Elevated blood pressure-currently- 841Y systolic; 606T at home;  followed by PCP.   # DISPOSITION:  # follow up in 6 months/ labs-CBC CMP CEA-Dr.B  # I reviewed the blood work- with the patient in detail; also reviewed the imaging independently [as summarized above]; and with the patient in detail.

## 2020-06-30 NOTE — Progress Notes (Signed)
Pt in of CT results and labs today.

## 2020-07-01 LAB — CEA: CEA: 1.2 ng/mL (ref 0.0–4.7)

## 2020-07-01 NOTE — Addendum Note (Signed)
Addended by: Delice Bison E on: 07/01/2020 09:25 AM   Modules accepted: Orders

## 2020-12-29 ENCOUNTER — Inpatient Hospital Stay: Payer: Medicare HMO | Attending: Internal Medicine

## 2020-12-29 ENCOUNTER — Encounter: Payer: Self-pay | Admitting: Internal Medicine

## 2020-12-29 ENCOUNTER — Inpatient Hospital Stay: Payer: Medicare HMO | Admitting: Internal Medicine

## 2020-12-29 ENCOUNTER — Other Ambulatory Visit: Payer: Self-pay

## 2020-12-29 DIAGNOSIS — C182 Malignant neoplasm of ascending colon: Secondary | ICD-10-CM | POA: Diagnosis not present

## 2020-12-29 DIAGNOSIS — R200 Anesthesia of skin: Secondary | ICD-10-CM | POA: Insufficient documentation

## 2020-12-29 DIAGNOSIS — Z9221 Personal history of antineoplastic chemotherapy: Secondary | ICD-10-CM | POA: Insufficient documentation

## 2020-12-29 LAB — CBC WITH DIFFERENTIAL/PLATELET
Abs Immature Granulocytes: 0.02 10*3/uL (ref 0.00–0.07)
Basophils Absolute: 0 10*3/uL (ref 0.0–0.1)
Basophils Relative: 1 %
Eosinophils Absolute: 0.1 10*3/uL (ref 0.0–0.5)
Eosinophils Relative: 1 %
HCT: 46.7 % (ref 39.0–52.0)
Hemoglobin: 15.8 g/dL (ref 13.0–17.0)
Immature Granulocytes: 0 %
Lymphocytes Relative: 21 %
Lymphs Abs: 1.9 10*3/uL (ref 0.7–4.0)
MCH: 30.9 pg (ref 26.0–34.0)
MCHC: 33.8 g/dL (ref 30.0–36.0)
MCV: 91.2 fL (ref 80.0–100.0)
Monocytes Absolute: 0.7 10*3/uL (ref 0.1–1.0)
Monocytes Relative: 8 %
Neutro Abs: 6.2 10*3/uL (ref 1.7–7.7)
Neutrophils Relative %: 69 %
Platelets: 206 10*3/uL (ref 150–400)
RBC: 5.12 MIL/uL (ref 4.22–5.81)
RDW: 13.1 % (ref 11.5–15.5)
WBC: 8.9 10*3/uL (ref 4.0–10.5)
nRBC: 0 % (ref 0.0–0.2)

## 2020-12-29 LAB — COMPREHENSIVE METABOLIC PANEL
ALT: 27 U/L (ref 0–44)
AST: 25 U/L (ref 15–41)
Albumin: 4.3 g/dL (ref 3.5–5.0)
Alkaline Phosphatase: 59 U/L (ref 38–126)
Anion gap: 11 (ref 5–15)
BUN: 18 mg/dL (ref 8–23)
CO2: 25 mmol/L (ref 22–32)
Calcium: 9.4 mg/dL (ref 8.9–10.3)
Chloride: 103 mmol/L (ref 98–111)
Creatinine, Ser: 0.81 mg/dL (ref 0.61–1.24)
GFR, Estimated: >60 mL/min (ref >60)
Glucose, Bld: 85 mg/dL (ref 70–99)
Potassium: 4.3 mmol/L (ref 3.5–5.1)
Sodium: 139 mmol/L (ref 135–145)
Total Bilirubin: 0.9 mg/dL (ref 0.3–1.2)
Total Protein: 7.4 g/dL (ref 6.5–8.1)

## 2020-12-29 NOTE — Assessment & Plan Note (Addendum)
#   Ascending colon cancer; stage III  S/p  adjuvant chemotherapy with FOLFOX; s/p RT [finished feb 7th 2018+ margin]. July 21st 2021-CT C/A/P-NED-stable clinically NED.  We will plan annual imaging moving forward.  Summer 2021-colonoscopy normal.; STABLE; will order scan in 6 months.  After that imaging, I think it is reasonable to follow-up clinically without any further imaging since that would be 5 years mark.  # LFTs- G-1 ?;  Improved fatty liver- Improved;recommend weight loss/ moderation of alcohol; eating healthy.   # Left hip replacement- [Dr.Bowers]-no contraindications from hematology/oncology standpoint.  #Tingling and numbness-grade 1; recommend acupuncture; Hold off gabapentin-stable  # Elevated blood pressure-currently- 242PN systolic; 361W at home;  followed by PCP.  Continue medication compliance.  # DISPOSITION:  # follow up in 6 months/ labs-CBC CMP CEA; CT C/A/P--Dr.B

## 2020-12-29 NOTE — Progress Notes (Signed)
The hospital [facility] [place] [facility] NOTE  Patient Care Team: Albina Billet, MD as PCP - General (Internal Medicine) Marden Noble, MD (Internal Medicine) Christene Lye, MD (General Surgery) Cammie Sickle, MD as Consulting Physician (Internal Medicine)  CHIEF COMPLAINTS/PURPOSE OF CONSULTATION:  Oncology History Overview Note  # June-July 2017-Ascending COLON CA STAGE III [pT3pN2 (4/14LN positive); POSITIVE RADIAL MARGIN; Dr.Sankar]; pre-op CEA- 1.5/N; MRI- liver-NEG; 03/26/2016- PET-NED   # June 24th 2017-  FOLFOX q 2 W; s/p RT [? Positive margin- finished feb 2018]  # IDA- [aug 2017] s/p IV venofer x4.   # colonoscopy [Dr.Sankar; June 2018; repeat summer 2021 ]   # MOLECULAR TESTING: MSI-STABLE; F-ONE-[july 2017] B- RAF V 600 E; Wild type Kras/N-ras ; others** ------------------------------------------------------------------   DIAGNOSIS: COLON CA  STAGE: III        ;GOALS: cure  CURRENT/MOST RECENT THERAPY: Surveillance    Cancer of ascending colon (HCC)     HISTORY OF PRESENTING ILLNESS:  Philip Shah 65 y.o.  male above history of stage III colon cancer currently on surveillance is here for follow-up.  In the interim patient fell hurt his back.  Patient is currently waiting left hip replacement.  Is followed by Dr. Harlow Mares.  Denies any blood in stools black or stools.  Denies any nausea vomiting abdominal pain or constipation.  Continues to have intermittent tingling numbness in extremities.  Not any worse.   Review of Systems  Constitutional: Negative for chills, diaphoresis, fever, malaise/fatigue and weight loss.  HENT: Negative for nosebleeds and sore throat.   Eyes: Negative for double vision.  Respiratory: Negative for cough, hemoptysis, sputum production, shortness of breath and wheezing.   Cardiovascular: Negative for chest pain, palpitations, orthopnea and leg swelling.  Gastrointestinal: Negative for abdominal  pain, blood in stool, constipation, diarrhea, heartburn, melena, nausea and vomiting.  Genitourinary: Negative for dysuria, frequency and urgency.  Musculoskeletal: Positive for back pain and joint pain.  Skin: Negative.  Negative for itching and rash.  Neurological: Positive for tingling. Negative for dizziness, focal weakness, weakness and headaches.  Endo/Heme/Allergies: Does not bruise/bleed easily.  Psychiatric/Behavioral: Negative for depression. The patient is not nervous/anxious and does not have insomnia.      MEDICAL HISTORY:  Past Medical History:  Diagnosis Date  . Abnormal colonoscopy 02-2016  . Anemia   . Colon cancer ([place])    colon ca, stage III  . Enlarged prostate   . GERD (gastroesophageal reflux disease)   . H/O vasectomy 6  . Hemorrhoids   . Hx of ligation of vein 1981  . Neuropathy    feet and finger tips, secondary to chemo treatments  . Sciatica   . Status post colon resection 02-26-16    SURGICAL HISTORY: Past Surgical History:  Procedure Laterality Date  . APPENDECTOMY    . CATARACT EXTRACTION W/PHACO Right 05/15/2018   Procedure: CATARACT EXTRACTION PHACO AND INTRAOCULAR LENS PLACEMENT ([place]) RIGHT;  Surgeon: Eulogio Bear, MD;  Location: [facility];  Service: Ophthalmology;  Laterality: Right;  . COLONOSCOPY WITH PROPOFOL N/A 02/11/2016   Procedure: COLONOSCOPY WITH PROPOFOL;  Surgeon: Christene Lye, MD;  Location: [facility];  Service: Endoscopy;  Laterality: N/A;  . COLONOSCOPY WITH PROPOFOL N/A 03/01/2017   Procedure: COLONOSCOPY WITH PROPOFOL;  Surgeon: Christene Lye, MD;  Location: [facility];  Service: Endoscopy;  Laterality: N/A;  . COLONOSCOPY WITH PROPOFOL N/A 05/07/2020   Procedure: COLONOSCOPY WITH PROPOFOL;  Surgeon: Robert Bellow, MD;  Location: [facility];  Service: Endoscopy;  Laterality: N/A;  . ESOPHAGOGASTRODUODENOSCOPY (EGD) WITH PROPOFOL N/A 02/11/2016   Procedure: ESOPHAGOGASTRODUODENOSCOPY  (EGD) WITH PROPOFOL;  Surgeon: Christene Lye, MD;  Location: [facility];  Service: Endoscopy;  Laterality: N/A;  . LAPAROSCOPIC RIGHT COLECTOMY Right 02/26/2016   Procedure: LAPAROSCOPIC RIGHT COLECTOMY;  Surgeon: Christene Lye, MD;  Location: [facility];  Service: General;  Laterality: Right;  . PORTACATH PLACEMENT N/A 03/18/2016   Procedure: INSERTION PORT-A-CATH;  Surgeon: Christene Lye, MD;  Location: [facility];  Service: General;  Laterality: N/A;  . VASECTOMY    . 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  Social History   Socioeconomic History  . Marital status: Single    Spouse name: Not on file  . Number of children: Not on file  . Years of education: Not on file  . Highest education level: Not on file  Occupational History  . Not on file  Tobacco Use  . Smoking status: Never Smoker  . Smokeless tobacco: Never Used  Vaping Use  . Vaping Use: Never used  Substance and Sexual Activity  . Alcohol use: Yes    Alcohol/week: 5.0 standard drinks    Types: 5 Cans of beer per week    Comment: none last 24hrs  . Drug use: No  . Sexual activity: Not on file  Other Topics Concern  . Not on file  Social History Narrative  . Not on file   Social Determinants of Health   Financial Resource Strain: Not on file  Food Insecurity: Not on file  Transportation Needs: Not on file  Physical Activity: Not on file  Stress: Not on file  Social Connections: Not on file  Intimate Partner Violence: Not on file    FAMILY HISTORY: mat aunt- anal cancer; oldest brother/pre-cancerous polyps- [[place]. In [place]] Family History  Problem Relation Age of Onset  . Kidney failure Mother   . Cancer Father        spine    ALLERGIES:  is allergic to penicillins.  MEDICATIONS:  Current Outpatient Medications  Medication Sig Dispense Refill  . diclofenac Sodium  (VOLTAREN) 1 % GEL Apply topically 4 (four) times daily.    . Multiple Vitamin (MULTIVITAMIN WITH MINERALS) TABS tablet Take 1 tablet by mouth daily.    . rosuvastatin (CRESTOR) 20 MG tablet     . Saw Palmetto, Serenoa repens, (SAW PALMETTO PO) Take 1 capsule by mouth daily.     Marland Kitchen tiZANidine (ZANAFLEX) 4 MG tablet tizanidine 4 mg tablet    . zinc gluconate 50 MG tablet Take 50 mg by mouth daily.     No current facility-administered medications for this visit.   Facility-Administered Medications Ordered in Other Visits  Medication Dose Route Frequency Provider Last Rate Last Admin  . sodium chloride flush (NS) 0.9 % injection 10 mL  10 mL Intravenous PRN Cammie Sickle, MD   10 mL at 05/04/17 0903      .  PHYSICAL EXAMINATION: ECOG PERFORMANCE STATUS: 0 - Asymptomatic  Vitals:   12/29/20 1025  BP: (!) 141/86  Pulse: 79  Resp: 16  Temp: 97.8 F (36.6 C)  SpO2: 100%   Filed Weights   12/29/20 1025  Weight: 195 lb (88.5 kg)    Physical Exam HENT:     Head: Normocephalic and atraumatic.     Mouth/Throat:  Pharynx: No oropharyngeal exudate.  Eyes:     Pupils: Pupils are equal, round, and reactive to light.  Cardiovascular:     Rate and Rhythm: Normal rate and regular rhythm.  Pulmonary:     Effort: No respiratory distress.     Breath sounds: No wheezing.  Abdominal:     General: Bowel sounds are normal. There is no distension.     Palpations: Abdomen is soft. There is no mass.     Tenderness: There is no abdominal tenderness. There is no guarding or rebound.  Musculoskeletal:        General: No tenderness. Normal range of motion.     Cervical back: Normal range of motion and neck supple.  Skin:    General: Skin is warm.  Neurological:     Mental Status: He is alert and oriented to person, place, and time.  Psychiatric:        Mood and Affect: Affect normal.     LABORATORY DATA:  I have reviewed the data as listed Lab Results  Component Value Date    WBC 8.9 12/29/2020   HGB 15.8 12/29/2020   HCT 46.7 12/29/2020   MCV 91.2 12/29/2020   PLT 206 12/29/2020   Recent Labs    03/26/20 1422 06/24/20 0936 06/30/20 0909 12/29/20 0928  NA 136  --  136 139  K 4.5  --  4.4 4.3  CL 101  --  101 103  CO2 25  --  27 25  GLUCOSE 99  --  123* 85  BUN 16  --  16 18  CREATININE 0.93 0.80 1.01 0.81  CALCIUM 8.9  --  8.4* 9.4  GFRNONAA >60  --  >60 >60  GFRAA >60  --   --   --   PROT 7.7  --  7.4 7.4  ALBUMIN 4.3  --  4.1 4.3  AST 71*  --  74* 25  ALT 68*  --  69* 27  ALKPHOS 59  --  56 59  BILITOT 1.2  --  1.0 0.9    RADIOGRAPHIC STUDIES: I have personally reviewed the radiological images as listed and agreed with the findings in the report. No results found. ASSESSMENT & PLAN:   Cancer of ascending colon ([place]) # Ascending colon cancer; stage III  S/p  adjuvant chemotherapy with FOLFOX; s/p RT [finished feb 7th 2018+ margin]. July 21st 2021-CT C/A/P-NED-stable clinically NED.  We will plan annual imaging moving forward.  Summer 2021-colonoscopy normal.; STABLE; will order scan in 6 months.  After that imaging, I think it is reasonable to follow-up clinically without any further imaging since that would be 5 years mark.  # LFTs- G-1 ?;  Improved fatty liver- Improved;recommend weight loss/ moderation of alcohol; eating healthy.   # Left hip replacement- [Dr.Bowers]-no contraindications from hematology/oncology standpoint.  #Tingling and numbness-grade 1; recommend acupuncture; Hold off gabapentin-stable  # Elevated blood pressure-currently- 836OQ systolic; 947M at home;  followed by PCP.  Continue medication compliance.  # DISPOSITION:  # follow up in 6 months/ labs-CBC CMP CEA; CT C/A/P--Dr.B     Cammie Sickle, MD 12/30/2020 8:00 AM

## 2020-12-30 LAB — CEA: CEA: 0.8 ng/mL (ref 0.0–4.7)

## 2021-02-04 HISTORY — PX: TOTAL HIP ARTHROPLASTY: SHX124

## 2021-06-30 ENCOUNTER — Other Ambulatory Visit: Payer: Self-pay

## 2021-06-30 ENCOUNTER — Ambulatory Visit
Admission: RE | Admit: 2021-06-30 | Discharge: 2021-06-30 | Disposition: A | Discharge status: Home / Self Care | Payer: Medicare HMO | Source: Ambulatory Visit | Attending: Internal Medicine | Admitting: Internal Medicine

## 2021-06-30 DIAGNOSIS — C182 Malignant neoplasm of ascending colon: Secondary | ICD-10-CM

## 2021-06-30 LAB — POCT I-STAT CREATININE: Creatinine, Ser: 0.8 mg/dL (ref 0.61–1.24)

## 2021-06-30 MED ORDER — IOHEXOL 300 MG/ML  SOLN
100.0000 mL | Freq: Once | INTRAMUSCULAR | Status: AC | PRN
  Administered 2021-06-30: 100 mL via INTRAVENOUS

## 2021-07-01 ENCOUNTER — Encounter: Payer: Self-pay | Admitting: Internal Medicine

## 2021-07-01 ENCOUNTER — Inpatient Hospital Stay: Payer: Medicare HMO | Admitting: Internal Medicine

## 2021-07-01 ENCOUNTER — Inpatient Hospital Stay: Payer: Medicare HMO | Attending: Internal Medicine

## 2021-07-01 DIAGNOSIS — R200 Anesthesia of skin: Secondary | ICD-10-CM | POA: Insufficient documentation

## 2021-07-01 DIAGNOSIS — C182 Malignant neoplasm of ascending colon: Secondary | ICD-10-CM | POA: Diagnosis not present

## 2021-07-01 DIAGNOSIS — Z96642 Presence of left artificial hip joint: Secondary | ICD-10-CM | POA: Diagnosis not present

## 2021-07-01 DIAGNOSIS — K769 Liver disease, unspecified: Secondary | ICD-10-CM | POA: Insufficient documentation

## 2021-07-01 DIAGNOSIS — C787 Secondary malignant neoplasm of liver and intrahepatic bile duct: Secondary | ICD-10-CM

## 2021-07-01 DIAGNOSIS — R202 Paresthesia of skin: Secondary | ICD-10-CM | POA: Diagnosis not present

## 2021-07-01 LAB — CBC WITH DIFFERENTIAL/PLATELET
Abs Immature Granulocytes: 0.04 10*3/uL (ref 0.00–0.07)
Basophils Absolute: 0.1 10*3/uL (ref 0.0–0.1)
Basophils Relative: 1 %
Eosinophils Absolute: 0.1 10*3/uL (ref 0.0–0.5)
Eosinophils Relative: 1 %
HCT: 46.2 % (ref 39.0–52.0)
Hemoglobin: 15.8 g/dL (ref 13.0–17.0)
Immature Granulocytes: 1 %
Lymphocytes Relative: 23 %
Lymphs Abs: 1.7 10*3/uL (ref 0.7–4.0)
MCH: 31.4 pg (ref 26.0–34.0)
MCHC: 34.2 g/dL (ref 30.0–36.0)
MCV: 91.8 fL (ref 80.0–100.0)
Monocytes Absolute: 0.6 10*3/uL (ref 0.1–1.0)
Monocytes Relative: 7 %
Neutro Abs: 5 10*3/uL (ref 1.7–7.7)
Neutrophils Relative %: 67 %
Platelets: 180 10*3/uL (ref 150–400)
RBC: 5.03 MIL/uL (ref 4.22–5.81)
RDW: 13.5 % (ref 11.5–15.5)
WBC: 7.4 10*3/uL (ref 4.0–10.5)
nRBC: 0 % (ref 0.0–0.2)

## 2021-07-01 LAB — COMPREHENSIVE METABOLIC PANEL
ALT: 20 U/L (ref 0–44)
AST: 22 U/L (ref 15–41)
Albumin: 4.2 g/dL (ref 3.5–5.0)
Alkaline Phosphatase: 67 U/L (ref 38–126)
Anion gap: 5 (ref 5–15)
BUN: 17 mg/dL (ref 8–23)
CO2: 29 mmol/L (ref 22–32)
Calcium: 9 mg/dL (ref 8.9–10.3)
Chloride: 102 mmol/L (ref 98–111)
Creatinine, Ser: 0.77 mg/dL (ref 0.61–1.24)
GFR, Estimated: >60 mL/min (ref >60)
Glucose, Bld: 124 mg/dL — ABNORMAL HIGH (ref 70–99)
Potassium: 4.5 mmol/L (ref 3.5–5.1)
Sodium: 136 mmol/L (ref 135–145)
Total Bilirubin: 1 mg/dL (ref 0.3–1.2)
Total Protein: 7.2 g/dL (ref 6.5–8.1)

## 2021-07-01 NOTE — Assessment & Plan Note (Addendum)
#   Ascending colon cancer; stage III  S/p  adjuvant chemotherapy with FOLFOX; s/p RT [finished feb 7th 2018+ margin].OCT 25th-CT C/A/P-NED-stable clinically NED-except for 7 mm dome of the liver lesion-artifact versus metastases. [see addendum below].  Summer 2021-colonoscopy normal.    # LFTs- G-1 ?;  Improved fatty liver-Improved; Recommend weight loss/ moderation of alcohol; eating healthy.   # Left hip replacement- [Dr.Bowers]-s/p from hematology/oncology standpoint.  #Tingling and numbness-grade 1; recommend acupuncture; Hold off gabapentin-STABLE  # Elevated blood pressure-currently- 801KP systolic; 537S at home;  followed by PCP.  Continue medication compliance.STABLE  # DISPOSITION:  # follow up in 6 months/ labs-CBC CMP CEA;--Dr.B  Addendum: Discussed with radiology-options include follow-up CT scan in 3 months Vs-MRI with and without contrast now for further evaluation.  I would recommend MRI of the liver. Ordered.  I left a voicemail for the patient.  Will call patient with results.  # I reviewed the blood work- with the patient in detail; also reviewed the imaging independently [as summarized above]; and with the patient in detail.

## 2021-07-01 NOTE — Progress Notes (Signed)
Spoke to patient regarding results of the CT scan notice of disease except for a 7 mm hypodense lesion not previously seen question related to Bolus Timing. Will discuss with Radiology regarding MRI versus short term follow up

## 2021-07-01 NOTE — Progress Notes (Signed)
The hospital [place] NOTE  Patient Care Team: Albina Billet, MD as PCP - General (Internal Medicine) Marden Noble, MD (Internal Medicine) Christene Lye, MD (General Surgery) Cammie Sickle, MD as Consulting Physician (Internal Medicine)  CHIEF COMPLAINTS/PURPOSE OF CONSULTATION:  Oncology History Overview Note  # June-July 2017-Ascending COLON CA STAGE III [pT3pN2 (4/14LN positive); POSITIVE RADIAL MARGIN; Dr.Sankar]; pre-op CEA- 1.5/N; MRI- liver-NEG; 03/26/2016- PET-NED   # June 24th 2017-  FOLFOX q 2 W; s/p RT [? Positive margin- finished feb 2018]  # IDA- [aug 2017] s/p IV venofer x4.   # colonoscopy [Dr.Sankar; June 2018; repeat summer 2021 ]   # MOLECULAR TESTING: MSI-STABLE; F-ONE-[july 2017] B- RAF V 600 E; Wild type Kras/N-ras ; others** ------------------------------------------------------------------   DIAGNOSIS: COLON CA  STAGE: III        ;GOALS: cure  CURRENT/MOST RECENT THERAPY: Surveillance    Cancer of ascending colon (HCC)     HISTORY OF PRESENTING ILLNESS:  Philip Shah 65 y.o.  male above history of stage III colon cancer currently on surveillance is here for follow-up/review results of the CT scan.  In the interim patient underwent left hip replacement.  Improved.  No blood in stools or black or stools no nausea vomiting.  NO abdominal pain or constipation. Continues to have intermittent tingling numbness in extremities.  Not any worse.   Review of Systems  Constitutional:  Negative for chills, diaphoresis, fever, malaise/fatigue and weight loss.  HENT:  Negative for nosebleeds and sore throat.   Eyes:  Negative for double vision.  Respiratory:  Negative for cough, hemoptysis, sputum production, shortness of breath and wheezing.   Cardiovascular:  Negative for chest pain, palpitations, orthopnea and leg swelling.  Gastrointestinal:  Negative for abdominal pain, blood in stool, constipation,  diarrhea, heartburn, melena, nausea and vomiting.  Genitourinary:  Negative for dysuria, frequency and urgency.  Musculoskeletal:  Positive for back pain and joint pain.  Skin: Negative.  Negative for itching and rash.  Neurological:  Positive for tingling. Negative for dizziness, focal weakness, weakness and headaches.  Endo/Heme/Allergies:  Does not bruise/bleed easily.  Psychiatric/Behavioral:  Negative for depression. The patient is not nervous/anxious and does not have insomnia.     MEDICAL HISTORY:  Past Medical History:  Diagnosis Date   Abnormal colonoscopy 02-2016   Anemia    Colon cancer (HCC)    colon ca, stage III   Enlarged prostate    GERD (gastroesophageal reflux disease)    H/O vasectomy 1990   Hemorrhoids    Hx of ligation of vein 1981   Neuropathy    feet and finger tips, secondary to chemo treatments   Sciatica    Status post colon resection 02-26-16    SURGICAL HISTORY: Past Surgical History:  Procedure Laterality Date   APPENDECTOMY     CATARACT EXTRACTION W/PHACO Right 05/15/2018   Procedure: CATARACT EXTRACTION PHACO AND INTRAOCULAR LENS PLACEMENT ([place]) RIGHT;  Surgeon: Eulogio Bear, MD;  Location: [facility];  Service: Ophthalmology;  Laterality: Right;   COLONOSCOPY WITH PROPOFOL N/A 02/11/2016   Procedure: COLONOSCOPY WITH PROPOFOL;  Surgeon: Christene Lye, MD;  Location: [facility];  Service: Endoscopy;  Laterality: N/A;   COLONOSCOPY WITH PROPOFOL N/A 03/01/2017   Procedure: COLONOSCOPY WITH PROPOFOL;  Surgeon: Christene Lye, MD;  Location: [facility];  Service: Endoscopy;  Laterality: N/A;   COLONOSCOPY WITH PROPOFOL N/A 05/07/2020   Procedure: COLONOSCOPY WITH PROPOFOL;  Surgeon: Hervey Ard  W, MD;  Location: [facility];  Service: Endoscopy;  Laterality: N/A;   ESOPHAGOGASTRODUODENOSCOPY (EGD) WITH PROPOFOL N/A 02/11/2016   Procedure: ESOPHAGOGASTRODUODENOSCOPY (EGD) WITH PROPOFOL;  Surgeon: Christene Lye, MD;  Location: [facility];  Service: Endoscopy;  Laterality: N/A;   LAPAROSCOPIC RIGHT COLECTOMY Right 02/26/2016   Procedure: LAPAROSCOPIC RIGHT COLECTOMY;  Surgeon: Christene Lye, MD;  Location: [facility];  Service: General;  Laterality: Right;   PORTACATH PLACEMENT N/A 03/18/2016   Procedure: INSERTION PORT-A-CATH;  Surgeon: Christene Lye, MD;  Location: [facility];  Service: General;  Laterality: N/A;   VASECTOMY     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  Social History   Socioeconomic History   Marital status: Single    Spouse name: Not on file   Number of children: Not on file   Years of education: Not on file   Highest education level: Not on file  Occupational History   Not on file  Tobacco Use   Smoking status: Never   Smokeless tobacco: Never  Vaping Use   Vaping Use: Never used  Substance and Sexual Activity   Alcohol use: Yes    Alcohol/week: 5.0 standard drinks    Types: 5 Cans of beer per week    Comment: none last 24hrs   Drug use: No   Sexual activity: Not on file  Other Topics Concern   Not on file  Social History Narrative   Not on file   Social Determinants of Health   Financial Resource Strain: Not on file  Food Insecurity: Not on file  Transportation Needs: Not on file  Physical Activity: Not on file  Stress: Not on file  Social Connections: Not on file  Intimate Partner Violence: Not on file    FAMILY HISTORY: mat aunt- anal cancer; oldest brother/pre-cancerous polyps- [Dr. In [place]] Family History  Problem Relation Age of Onset   Kidney failure Mother    Cancer Father        spine    ALLERGIES:  is allergic to penicillins.  MEDICATIONS:  Current Outpatient Medications  Medication Sig Dispense Refill   Multiple Vitamin (MULTIVITAMIN WITH MINERALS) TABS tablet Take 1 tablet by mouth daily.      rosuvastatin (CRESTOR) 20 MG tablet      zinc gluconate 50 MG tablet Take 50 mg by mouth daily.     diclofenac Sodium (VOLTAREN) 1 % GEL Apply topically 4 (four) times daily. (Patient not taking: Reported on 07/01/2021)     Saw Palmetto, Serenoa repens, (SAW PALMETTO PO) Take 1 capsule by mouth daily.  (Patient not taking: Reported on 07/01/2021)     tiZANidine (ZANAFLEX) 4 MG tablet tizanidine 4 mg tablet (Patient not taking: Reported on 07/01/2021)     No current facility-administered medications for this visit.   Facility-Administered Medications Ordered in Other Visits  Medication Dose Route Frequency Provider Last Rate Last Admin   sodium chloride flush (NS) 0.9 % injection 10 mL  10 mL Intravenous PRN Cammie Sickle, MD   10 mL at 05/04/17 0903      .  PHYSICAL EXAMINATION: ECOG PERFORMANCE STATUS: 0 - Asymptomatic  Vitals:   07/01/21 0953  BP: (!) 144/85  Pulse: 75  Resp: 18  Temp: 98.3 F (36.8 C)  SpO2: 98%   Filed Weights   07/01/21 0953  Weight: 194 lb 14.4 oz (88.4 kg)  Physical Exam HENT:     Head: Normocephalic and atraumatic.     Mouth/Throat:     Pharynx: No oropharyngeal exudate.  Eyes:     Pupils: Pupils are equal, round, and reactive to light.  Cardiovascular:     Rate and Rhythm: Normal rate and regular rhythm.  Pulmonary:     Effort: No respiratory distress.     Breath sounds: No wheezing.  Abdominal:     General: Bowel sounds are normal. There is no distension.     Palpations: Abdomen is soft. There is no mass.     Tenderness: There is no abdominal tenderness. There is no guarding or rebound.  Musculoskeletal:        General: No tenderness. Normal range of motion.     Cervical back: Normal range of motion and neck supple.  Skin:    General: Skin is warm.  Neurological:     Mental Status: He is alert and oriented to person, place, and time.  Psychiatric:        Mood and Affect: Affect normal.    LABORATORY DATA:  I have  reviewed the data as listed Lab Results  Component Value Date   WBC 7.4 07/01/2021   HGB 15.8 07/01/2021   HCT 46.2 07/01/2021   MCV 91.8 07/01/2021   PLT 180 07/01/2021   Recent Labs    12/29/20 0928 06/30/21 1008 07/01/21 0936  NA 139  --  136  K 4.3  --  4.5  CL 103  --  102  CO2 25  --  29  GLUCOSE 85  --  124*  BUN 18  --  17  CREATININE 0.81 0.80 0.77  CALCIUM 9.4  --  9.0  GFRNONAA >60  --  >60  PROT 7.4  --  7.2  ALBUMIN 4.3  --  4.2  AST 25  --  22  ALT 27  --  20  ALKPHOS 59  --  67  BILITOT 0.9  --  1.0    RADIOGRAPHIC STUDIES: I have personally reviewed the radiological images as listed and agreed with the findings in the report. CT CHEST ABDOMEN PELVIS W CONTRAST  Result Date: 07/01/2021 CLINICAL DATA:  Colon cancer.  Restaging. EXAM: CT CHEST, ABDOMEN, AND PELVIS WITH CONTRAST TECHNIQUE: Multidetector CT imaging of the chest, abdomen and pelvis was performed following the standard protocol during bolus administration of intravenous contrast. CONTRAST:  182m OMNIPAQUE IOHEXOL 300 MG/ML  SOLN COMPARISON:  Chest abdomen pelvis CT 03/26/2020 FINDINGS: CT CHEST FINDINGS Cardiovascular: The heart size is normal. No substantial pericardial effusion. Coronary artery calcification is evident. No thoracic aortic aneurysm. Mediastinum/Nodes: No mediastinal lymphadenopathy. There is no hilar lymphadenopathy. The esophagus has normal imaging features. There is no axillary lymphadenopathy. Lungs/Pleura: No suspicious pulmonary nodule or mass. No focal airspace consolidation. No pleural effusion. Musculoskeletal: No worrisome lytic or sclerotic osseous abnormality. CT ABDOMEN PELVIS FINDINGS Hepatobiliary: 7 mm hypodensity in the anterior hepatic dome (50/2) was not visible on prior studies although this may be related to bolus timing. Liver otherwise unremarkable. There is no evidence for gallstones, gallbladder wall thickening, or pericholecystic fluid. No intrahepatic or  extrahepatic biliary dilation. Pancreas: No focal mass lesion. No dilatation of the main duct. No intraparenchymal cyst. No peripancreatic edema. Spleen: No splenomegaly. No focal mass lesion. Adrenals/Urinary Tract: No adrenal nodule or mass. Central sinus cysts noted in both kidneys. No evidence for hydroureter. Bladder is nondistended and partially obscured by beam hardening artifact from left hip  replacement. Stomach/Bowel: Stomach is unremarkable. No gastric wall thickening. No evidence of outlet obstruction. Duodenum is normally positioned as is the ligament of Treitz. No small bowel wall thickening. No small bowel dilatation. Right hemicolectomy. No gross colonic mass. No colonic wall thickening. Vascular/Lymphatic: No abdominal aortic aneurysm. There is no gastrohepatic or hepatoduodenal ligament lymphadenopathy. No retroperitoneal or mesenteric lymphadenopathy. No pelvic sidewall lymphadenopathy. Reproductive: Prostate gland appears mildly enlarged. Other: No intraperitoneal free fluid. Musculoskeletal: Status post left total hip replacement No worrisome lytic or sclerotic osseous abnormality. IMPRESSION: 1. No definite evidence for metastatic disease in the chest, abdomen, or pelvis. 2. 7 mm hypodensity in the anterior hepatic dome was not visible on prior studies although this may be related to bolus timing. Close follow-up recommended. 3. Right hemicolectomy. Electronically Signed   By: Misty Stanley M.D.   On: 07/01/2021 16:23   ASSESSMENT & PLAN:   Cancer of ascending colon ([place]) # Ascending colon cancer; stage III  S/p  adjuvant chemotherapy with FOLFOX; s/p RT [finished feb 7th 2018+ margin].OCT 25th-CT C/A/P-NED-stable clinically NED-except for 7 mm dome of the liver lesion-artifact versus metastases. [see addendum below].  Summer 2021-colonoscopy normal.    # LFTs- G-1 ?;  Improved fatty liver-Improved; Recommend weight loss/ moderation of alcohol; eating healthy.   # Left hip replacement-  [Dr.Bowers]-s/p from hematology/oncology standpoint.  #Tingling and numbness-grade 1; recommend acupuncture; Hold off gabapentin-STABLE  # Elevated blood pressure-currently- 069NP systolic; 672E at home;  followed by PCP.  Continue medication compliance.STABLE  # DISPOSITION:  # follow up in 6 months/ labs-CBC CMP CEA;--Dr.B  Addendum: Discussed with radiology-options include follow-up CT scan in 3 months Vs-MRI with and without contrast now for further evaluation.  I would recommend MRI of the liver. Ordered.  I left a voicemail for the patient.  Will call patient with results.  # I reviewed the blood work- with the patient in detail; also reviewed the imaging independently [as summarized above]; and with the patient in detail.      Cammie Sickle, MD 07/03/2021 9:20 AM

## 2021-07-02 LAB — CEA: CEA: 1 ng/mL (ref 0.0–4.7)

## 2021-07-02 NOTE — Progress Notes (Signed)
I reviewed the liver findings With the radiologist. Options include CT scan repeat in three months or MRI for the evaluate the liver lesion.  My preference would be is to get the MRI done now.  I left a message for the patient With my above recommendations. [place] please schedule the MRI liver.

## 2021-07-03 ENCOUNTER — Encounter: Payer: Self-pay | Admitting: Internal Medicine

## 2021-11-02 ENCOUNTER — Telehealth: Payer: Self-pay | Admitting: *Deleted

## 2021-11-02 NOTE — Telephone Encounter (Signed)
Patient needs to change April appointment.

## 2021-11-05 ENCOUNTER — Encounter: Payer: Self-pay | Admitting: Internal Medicine

## 2021-11-05 ENCOUNTER — Telehealth: Payer: Self-pay

## 2021-11-05 NOTE — Telephone Encounter (Signed)
I called him and LVM to call me like 4 days ago ill reach out again ?

## 2021-11-05 NOTE — Telephone Encounter (Signed)
Patient left voicemail on MD clinical team line requesting to r/s his 4/26 appts. ? ?Please contact pt to r/s. ?

## 2021-12-30 ENCOUNTER — Ambulatory Visit: Payer: Medicare HMO | Admitting: Internal Medicine

## 2021-12-30 ENCOUNTER — Other Ambulatory Visit: Payer: Medicare HMO

## 2022-01-18 ENCOUNTER — Other Ambulatory Visit: Payer: Self-pay

## 2022-01-18 DIAGNOSIS — C182 Malignant neoplasm of ascending colon: Secondary | ICD-10-CM

## 2022-01-19 ENCOUNTER — Inpatient Hospital Stay: Payer: Medicare HMO | Attending: Internal Medicine

## 2022-01-19 ENCOUNTER — Other Ambulatory Visit: Payer: Self-pay

## 2022-01-19 ENCOUNTER — Encounter: Payer: Self-pay | Admitting: Internal Medicine

## 2022-01-19 ENCOUNTER — Inpatient Hospital Stay (HOSPITAL_BASED_OUTPATIENT_CLINIC_OR_DEPARTMENT_OTHER): Payer: Medicare HMO | Admitting: Internal Medicine

## 2022-01-19 VITALS — BP 141/87 | HR 81 | Temp 98.5°F | Resp 18 | Wt 193.8 lb

## 2022-01-19 DIAGNOSIS — Z9221 Personal history of antineoplastic chemotherapy: Secondary | ICD-10-CM | POA: Diagnosis not present

## 2022-01-19 DIAGNOSIS — C182 Malignant neoplasm of ascending colon: Secondary | ICD-10-CM | POA: Insufficient documentation

## 2022-01-19 DIAGNOSIS — K769 Liver disease, unspecified: Secondary | ICD-10-CM | POA: Diagnosis not present

## 2022-01-19 DIAGNOSIS — R202 Paresthesia of skin: Secondary | ICD-10-CM | POA: Insufficient documentation

## 2022-01-19 DIAGNOSIS — Z96642 Presence of left artificial hip joint: Secondary | ICD-10-CM | POA: Diagnosis not present

## 2022-01-19 DIAGNOSIS — R200 Anesthesia of skin: Secondary | ICD-10-CM | POA: Diagnosis not present

## 2022-01-19 LAB — CBC WITH DIFFERENTIAL/PLATELET
Abs Immature Granulocytes: 0.03 10*3/uL (ref 0.00–0.07)
Basophils Absolute: 0.1 10*3/uL (ref 0.0–0.1)
Basophils Relative: 1 %
Eosinophils Absolute: 0.1 10*3/uL (ref 0.0–0.5)
Eosinophils Relative: 1 %
HCT: 45.9 % (ref 39.0–52.0)
Hemoglobin: 15.9 g/dL (ref 13.0–17.0)
Immature Granulocytes: 0 %
Lymphocytes Relative: 24 %
Lymphs Abs: 1.8 10*3/uL (ref 0.7–4.0)
MCH: 32.1 pg (ref 26.0–34.0)
MCHC: 34.6 g/dL (ref 30.0–36.0)
MCV: 92.5 fL (ref 80.0–100.0)
Monocytes Absolute: 0.6 10*3/uL (ref 0.1–1.0)
Monocytes Relative: 8 %
Neutro Abs: 5.1 10*3/uL (ref 1.7–7.7)
Neutrophils Relative %: 66 %
Platelets: 176 10*3/uL (ref 150–400)
RBC: 4.96 MIL/uL (ref 4.22–5.81)
RDW: 12.2 % (ref 11.5–15.5)
WBC: 7.6 10*3/uL (ref 4.0–10.5)
nRBC: 0 % (ref 0.0–0.2)

## 2022-01-19 LAB — COMPREHENSIVE METABOLIC PANEL
ALT: 22 U/L (ref 0–44)
AST: 21 U/L (ref 15–41)
Albumin: 4 g/dL (ref 3.5–5.0)
Alkaline Phosphatase: 54 U/L (ref 38–126)
Anion gap: 8 (ref 5–15)
BUN: 14 mg/dL (ref 8–23)
CO2: 25 mmol/L (ref 22–32)
Calcium: 8.6 mg/dL — ABNORMAL LOW (ref 8.9–10.3)
Chloride: 105 mmol/L (ref 98–111)
Creatinine, Ser: 0.9 mg/dL (ref 0.61–1.24)
GFR, Estimated: >60 mL/min (ref >60)
Glucose, Bld: 131 mg/dL — ABNORMAL HIGH (ref 70–99)
Potassium: 4.8 mmol/L (ref 3.5–5.1)
Sodium: 138 mmol/L (ref 135–145)
Total Bilirubin: 0.9 mg/dL (ref 0.3–1.2)
Total Protein: 7.2 g/dL (ref 6.5–8.1)

## 2022-01-19 NOTE — Assessment & Plan Note (Addendum)
#   Ascending colon cancer; stage III  S/p  adjuvant chemotherapy with FOLFOX; s/p RT [finished feb 7th 2018+ margin].OCT 25th, 2022-CT C/A/P-NED-stable clinically NED-except for 7 mm dome of the liver lesion-artifact versus metastases.  I would recommend MRI of the liver.  [patient did not have it done as recommended because of hip surgery].  He is interested in having the MRI done.  This will be scheduled in the next couple of weeks.    Summer 2021-colonoscopy normal.   ? ?#Tingling and numbness-grade 1; recommend acupuncture; Hold off gabapentin-STABLE ? ?# Elevated blood pressure-currently- 842JI systolic; 312O at home;  followed by PCP.  Continue medication compliance.STABLE ? ?# DISPOSITION:  ?# MRI liver- in next 2-3 weeks ?# follow up in 6 months/ labs-CBC CMP CEA;--Dr.B ? ? ? ? ?

## 2022-01-19 NOTE — Progress Notes (Signed)
Patient denies new problems/concerns today.   °

## 2022-01-19 NOTE — Progress Notes (Signed)
? ?[facility] ?CONSULT NOTE ? ?Patient Care Team: ?Albina Billet, MD as PCP - General (Internal Medicine) ?Marden Noble, MD (Internal Medicine) ?Christene Lye, MD (General Surgery) ?Cammie Sickle, MD as Consulting Physician (Internal Medicine) ? ?CHIEF COMPLAINTS/PURPOSE OF CONSULTATION:  ?Oncology History Overview Note  ?# June-July 2017-Ascending COLON CA STAGE III [pT3pN2 (4/14LN positive); POSITIVE RADIAL MARGIN; Dr.Sankar]; pre-op CEA- 1.5/N; MRI- liver-NEG; 03/26/2016- PET-NED  ? ?# June 24th 2017-  FOLFOX q 2 W; s/p RT [? Positive margin- finished feb 2018] ? ?# IDA- [aug 2017] s/p IV venofer x4.  ? ?# colonoscopy [Dr.Sankar; June 2018; repeat summer 2021 ] ? ? ?# MOLECULAR TESTING: MSI-STABLE; F-ONE-[july 2017] B- RAF V 600 E; Wild type Kras/N-ras ; others** ?------------------------------------------------------------------ ? ? ?DIAGNOSIS: COLON CA ? ?STAGE: III        ;GOALS: cure ? ?CURRENT/MOST RECENT THERAPY: Surveillance ? ?  ?Cancer of ascending colon ([place])  ? ? ? ?HISTORY OF PRESENTING ILLNESS: Alone.  Ambulating independently. ?Philip Shah 66 y.o.  male above history of stage III colon cancer currently on surveillance is here for follow-up/. ? ?In the interim patient underwent left hip replacement.  Improved.  Patient did not have MRI liver given his left hip surgery. ? ?No blood in stools or black or stools no nausea vomiting.  NO abdominal pain or constipation. Continues to have intermittent tingling numbness in extremities.  Not any worse. ? ? ?Review of Systems  ?Constitutional:  Negative for chills, diaphoresis, fever, malaise/fatigue and weight loss.  ?HENT:  Negative for nosebleeds and sore throat.   ?Eyes:  Negative for double vision.  ?Respiratory:  Negative for cough, hemoptysis, sputum production, shortness of breath and wheezing.   ?Cardiovascular:  Negative for chest pain, palpitations, orthopnea and leg swelling.   ?Gastrointestinal:  Negative for abdominal pain, blood in stool, constipation, diarrhea, heartburn, melena, nausea and vomiting.  ?Genitourinary:  Negative for dysuria, frequency and urgency.  ?Musculoskeletal:  Positive for back pain and joint pain.  ?Skin: Negative.  Negative for itching and rash.  ?Neurological:  Positive for tingling. Negative for dizziness, focal weakness, weakness and headaches.  ?Endo/Heme/Allergies:  Does not bruise/bleed easily.  ?Psychiatric/Behavioral:  Negative for depression. The patient is not nervous/anxious and does not have insomnia.   ? ? ?MEDICAL HISTORY:  ?Past Medical History:  ?Diagnosis Date  ? Abnormal colonoscopy 02-2016  ? Anemia   ? Colon cancer ([facility])   ? colon ca, stage III  ? Enlarged prostate   ? GERD (gastroesophageal reflux disease)   ? H/O vasectomy 65  ? Hemorrhoids   ? Hx of ligation of vein 1981  ? Neuropathy   ? feet and finger tips, secondary to chemo treatments  ? Sciatica   ? Status post colon resection 02-26-16  ? ? ?SURGICAL HISTORY: ?Past Surgical History:  ?Procedure Laterality Date  ? APPENDECTOMY    ? CATARACT EXTRACTION W/PHACO Right 05/15/2018  ? Procedure: CATARACT EXTRACTION PHACO AND INTRAOCULAR LENS PLACEMENT ([facility]) RIGHT;  Surgeon: Eulogio Bear, MD;  Location: [place];  Service: Ophthalmology;  Laterality: Right;  ? COLONOSCOPY WITH PROPOFOL N/A 02/11/2016  ? Procedure: COLONOSCOPY WITH PROPOFOL;  Surgeon: Christene Lye, MD;  Location: [facility] ENDOSCOPY;  Service: Endoscopy;  Laterality: N/A;  ? COLONOSCOPY WITH PROPOFOL N/A 03/01/2017  ? Procedure: COLONOSCOPY WITH PROPOFOL;  Surgeon: Christene Lye, MD;  Location: [facility] ENDOSCOPY;  Service: Endoscopy;  Laterality: N/A;  ? COLONOSCOPY WITH PROPOFOL N/A 05/07/2020  ?  Procedure: COLONOSCOPY WITH PROPOFOL;  Surgeon: Robert Bellow, MD;  Location: [facility] ENDOSCOPY;  Service: Endoscopy;  Laterality: N/A;  ? ESOPHAGOGASTRODUODENOSCOPY (EGD) WITH PROPOFOL N/A 02/11/2016   ? Procedure: ESOPHAGOGASTRODUODENOSCOPY (EGD) WITH PROPOFOL;  Surgeon: Christene Lye, MD;  Location: [facility] ENDOSCOPY;  Service: Endoscopy;  Laterality: N/A;  ? LAPAROSCOPIC RIGHT COLECTOMY Right 02/26/2016  ? Procedure: LAPAROSCOPIC RIGHT COLECTOMY;  Surgeon: Christene Lye, MD;  Location: [facility] ORS;  Service: General;  Laterality: Right;  ? PORTACATH PLACEMENT N/A 03/18/2016  ? Procedure: INSERTION PORT-A-CATH;  Surgeon: Christene Lye, MD;  Location: [facility] ORS;  Service: General;  Laterality: N/A;  ? TOTAL HIP ARTHROPLASTY Left 02/2021  ? VASECTOMY    ? VEIN LIGATION Right 05/08/1979  ? Dr Pat Kocher  ? ? ?SOCIAL HISTORY: 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 ? ?Social History  ? ?Socioeconomic History  ? Marital status: Single  ?  Spouse name: Not on file  ? Number of children: Not on file  ? Years of education: Not on file  ? Highest education level: Not on file  ?Occupational History  ? Not on file  ?Tobacco Use  ? Smoking status: Never  ? Smokeless tobacco: Never  ?Vaping Use  ? Vaping Use: Never used  ?Substance and Sexual Activity  ? Alcohol use: Yes  ?  Alcohol/week: 5.0 standard drinks  ?  Types: 5 Cans of beer per week  ?  Comment: none last 24hrs  ? Drug use: No  ? Sexual activity: Not on file  ?Other Topics Concern  ? Not on file  ?Social History Narrative  ? Not on file  ? ?Social Determinants of Health  ? ?Financial Resource Strain: Not on file  ?Food Insecurity: Not on file  ?Transportation Needs: Not on file  ?Physical Activity: Not on file  ?Stress: Not on file  ?Social Connections: Not on file  ?Intimate Partner Violence: Not on file  ? ? ?FAMILY HISTORY: mat aunt- anal cancer; oldest brother/pre-cancerous polyps- [Dr. In [place]] ?Family History  ?Problem Relation Age of Onset  ? Kidney failure Mother   ? Cancer Father   ?     spine  ? ? ?ALLERGIES:  is allergic to penicillins. ? ?MEDICATIONS:  ?Current Outpatient Medications   ?Medication Sig Dispense Refill  ? Multiple Vitamin (MULTIVITAMIN WITH MINERALS) TABS tablet Take 1 tablet by mouth daily.    ? rosuvastatin (CRESTOR) 20 MG tablet     ? zinc gluconate 50 MG tablet Take 50 mg by mouth daily.    ? diclofenac Sodium (VOLTAREN) 1 % GEL Apply topically 4 (four) times daily. (Patient not taking: Reported on 07/01/2021)    ? Saw Palmetto, Serenoa repens, (SAW PALMETTO PO) Take 1 capsule by mouth daily.  (Patient not taking: Reported on 07/01/2021)    ? tiZANidine (ZANAFLEX) 4 MG tablet tizanidine 4 mg tablet (Patient not taking: Reported on 07/01/2021)    ? ?No current facility-administered medications for this visit.  ? ?Facility-Administered Medications Ordered in Other Visits  ?Medication Dose Route Frequency Provider Last Rate Last Admin  ? sodium chloride flush (NS) 0.9 % injection 10 mL  10 mL Intravenous PRN Cammie Sickle, MD   10 mL at 05/04/17 0903  ? ? ?  ?. ? ?PHYSICAL EXAMINATION: ?ECOG PERFORMANCE STATUS: 0 - Asymptomatic ? ?Vitals:  ? 01/19/22 0900  ?BP: (!) 141/87  ?Pulse: 81  ?Resp: 18  ?Temp: 98.5 ?F (36.9 ?C)  ? ?Filed Weights  ?  01/19/22 0900  ?Weight: 193 lb 12.8 oz (87.9 kg)  ? ? ?Physical Exam ?HENT:  ?   Head: Normocephalic and atraumatic.  ?   Mouth/Throat:  ?   Pharynx: No oropharyngeal exudate.  ?Eyes:  ?   Pupils: Pupils are equal, round, and reactive to light.  ?Cardiovascular:  ?   Rate and Rhythm: Normal rate and regular rhythm.  ?Pulmonary:  ?   Effort: No respiratory distress.  ?   Breath sounds: No wheezing.  ?Abdominal:  ?   General: Bowel sounds are normal. There is no distension.  ?   Palpations: Abdomen is soft. There is no mass.  ?   Tenderness: There is no abdominal tenderness. There is no guarding or rebound.  ?Musculoskeletal:     ?   General: No tenderness. Normal range of motion.  ?   Cervical back: Normal range of motion and neck supple.  ?Skin: ?   General: Skin is warm.  ?Neurological:  ?   Mental Status: He is alert and oriented to  person, place, and time.  ?Psychiatric:     ?   Mood and Affect: Affect normal.  ? ? ?LABORATORY DATA:  ?I have reviewed the data as listed ?Lab Results  ?Component Value Date  ? WBC 7.6 01/19/2022  ? HGB 15.9 01/19/2022

## 2022-02-02 ENCOUNTER — Ambulatory Visit
Admission: RE | Admit: 2022-02-02 | Discharge: 2022-02-02 | Disposition: A | Discharge status: Home / Self Care | Payer: Medicare HMO | Source: Ambulatory Visit | Attending: Internal Medicine | Admitting: Internal Medicine

## 2022-02-02 DIAGNOSIS — C787 Secondary malignant neoplasm of liver and intrahepatic bile duct: Secondary | ICD-10-CM | POA: Diagnosis present

## 2022-02-02 MED ORDER — GADOBUTROL 1 MMOL/ML IV SOLN
9.0000 mL | Freq: Once | INTRAVENOUS | Status: AC | PRN
  Administered 2022-02-02: 9 mL via INTRAVENOUS

## 2022-07-19 ENCOUNTER — Other Ambulatory Visit: Payer: Medicare HMO

## 2022-07-22 ENCOUNTER — Ambulatory Visit: Payer: Medicare HMO | Admitting: Internal Medicine

## 2022-08-23 ENCOUNTER — Encounter: Payer: Self-pay | Admitting: Oncology

## 2022-08-23 ENCOUNTER — Inpatient Hospital Stay (HOSPITAL_BASED_OUTPATIENT_CLINIC_OR_DEPARTMENT_OTHER): Payer: Medicare HMO | Admitting: Oncology

## 2022-08-23 ENCOUNTER — Inpatient Hospital Stay: Payer: Medicare HMO | Attending: Internal Medicine

## 2022-08-23 VITALS — BP 153/91 | HR 78 | Temp 98.7°F | Resp 16 | Wt 194.8 lb

## 2022-08-23 DIAGNOSIS — Z85038 Personal history of other malignant neoplasm of large intestine: Secondary | ICD-10-CM | POA: Insufficient documentation

## 2022-08-23 DIAGNOSIS — C182 Malignant neoplasm of ascending colon: Secondary | ICD-10-CM

## 2022-08-23 LAB — COMPREHENSIVE METABOLIC PANEL
ALT: 33 U/L (ref 0–44)
AST: 28 U/L (ref 15–41)
Albumin: 4.2 g/dL (ref 3.5–5.0)
Alkaline Phosphatase: 71 U/L (ref 38–126)
Anion gap: 8 (ref 5–15)
BUN: 11 mg/dL (ref 8–23)
CO2: 27 mmol/L (ref 22–32)
Calcium: 8.8 mg/dL — ABNORMAL LOW (ref 8.9–10.3)
Chloride: 105 mmol/L (ref 98–111)
Creatinine, Ser: 0.9 mg/dL (ref 0.61–1.24)
GFR, Estimated: >60 mL/min (ref >60)
Glucose, Bld: 126 mg/dL — ABNORMAL HIGH (ref 70–99)
Potassium: 4.8 mmol/L (ref 3.5–5.1)
Sodium: 140 mmol/L (ref 135–145)
Total Bilirubin: 0.8 mg/dL (ref 0.3–1.2)
Total Protein: 7.5 g/dL (ref 6.5–8.1)

## 2022-08-23 LAB — CBC WITH DIFFERENTIAL/PLATELET
Abs Immature Granulocytes: 0.03 10*3/uL (ref 0.00–0.07)
Basophils Absolute: 0.1 10*3/uL (ref 0.0–0.1)
Basophils Relative: 1 %
Eosinophils Absolute: 0 10*3/uL (ref 0.0–0.5)
Eosinophils Relative: 0 %
HCT: 45.8 % (ref 39.0–52.0)
Hemoglobin: 15.8 g/dL (ref 13.0–17.0)
Immature Granulocytes: 0 %
Lymphocytes Relative: 22 %
Lymphs Abs: 2 10*3/uL (ref 0.7–4.0)
MCH: 31.9 pg (ref 26.0–34.0)
MCHC: 34.5 g/dL (ref 30.0–36.0)
MCV: 92.3 fL (ref 80.0–100.0)
Monocytes Absolute: 0.6 10*3/uL (ref 0.1–1.0)
Monocytes Relative: 6 %
Neutro Abs: 6.4 10*3/uL (ref 1.7–7.7)
Neutrophils Relative %: 71 %
Platelets: 210 10*3/uL (ref 150–400)
RBC: 4.96 MIL/uL (ref 4.22–5.81)
RDW: 12.4 % (ref 11.5–15.5)
WBC: 9.2 10*3/uL (ref 4.0–10.5)
nRBC: 0 % (ref 0.0–0.2)

## 2022-08-23 NOTE — Progress Notes (Signed)
Pt in for follow up, denies any concerns today. 

## 2022-08-23 NOTE — Progress Notes (Signed)
[place]  Telephone:([phone] [phone]   Patient Care Team: Albina Billet, MD as PCP - General (Internal Medicine) Marden Noble, MD (Internal Medicine) Christene Lye, MD (General Surgery) Cammie Sickle, MD as Consulting Physician (Internal Medicine)  CHIEF COMPLAINTS/PURPOSE OF CONSULTATION:  Oncology History Overview Note  # June-July 2017-Ascending COLON CA STAGE III [pT3pN2 (4/14LN positive); POSITIVE RADIAL MARGIN; Dr.Sankar]; pre-op CEA- 1.5/N; MRI- liver-NEG; 03/26/2016- PET-NED   # June 24th 2017-  FOLFOX q 2 W; s/p RT [? Positive margin- finished feb 2018]  # IDA- [aug 2017] s/p IV venofer x4.   # colonoscopy [Dr.Sankar; June 2018; repeat summer 2021 ]   # MOLECULAR TESTING: MSI-STABLE; F-ONE-[july 2017] B- RAF V 600 E; Wild type Kras/N-ras ; others** ------------------------------------------------------------------   DIAGNOSIS: COLON CA  STAGE: III        ;GOALS: cure  CURRENT/MOST RECENT THERAPY: Surveillance    Cancer of ascending colon ([facility])     HISTORY OF PRESENTING ILLNESS:  Patient is a 66 year old male with history of stage III colon cancer currently on surveillance here for 27-monthfollow-up.  He was last seen by Dr. BRogue Bussingon 01/19/2022 for routine follow-up.  He was doing well.  He had a MRI of his abdomen completed in the interval for a 7 mm liver lesion seen on a previous CT scan concerning for metastatic disease versus artifact.  Results from 02/02/2021 showed stable benign hemangioma and no evidence of metastatic disease or significant abnormality.  He has not had any concerns since his last visit.  Had his left hip replaced earlier this year.  Continues to improve.   Review of Systems  Constitutional: Negative.      MEDICAL HISTORY:  Past Medical History:  Diagnosis Date   Abnormal colonoscopy 02-2016   Anemia    Colon cancer ([facility])    colon ca, stage III   Enlarged prostate     GERD (gastroesophageal reflux disease)    H/O vasectomy 1990   Hemorrhoids    Hx of ligation of vein 1981   Neuropathy    feet and finger tips, secondary to chemo treatments   Sciatica    Status post colon resection 02-26-16    SURGICAL HISTORY: Past Surgical History:  Procedure Laterality Date   APPENDECTOMY     CATARACT EXTRACTION W/PHACO Right 05/15/2018   Procedure: CATARACT EXTRACTION PHACO AND INTRAOCULAR LENS PLACEMENT ([place] RIGHT;  Surgeon: KEulogio Bear MD;  [place]  Service: Ophthalmology;  Laterality: Right;   COLONOSCOPY WITH PROPOFOL N/A 02/11/2016   Procedure: COLONOSCOPY WITH PROPOFOL;  Surgeon: SChristene Lye MD;  Location: [facility] ENDOSCOPY;  Service: Endoscopy;  Laterality: N/A;   COLONOSCOPY WITH PROPOFOL N/A 03/01/2017   Procedure: COLONOSCOPY WITH PROPOFOL;  Surgeon: SChristene Lye MD;  Location: [facility] ENDOSCOPY;  Service: Endoscopy;  Laterality: N/A;   COLONOSCOPY WITH PROPOFOL N/A 05/07/2020   Procedure: COLONOSCOPY WITH PROPOFOL;  Surgeon: BRobert Bellow MD;  Location: [facility] ENDOSCOPY;  Service: Endoscopy;  Laterality: N/A;   ESOPHAGOGASTRODUODENOSCOPY (EGD) WITH PROPOFOL N/A 02/11/2016   Procedure: ESOPHAGOGASTRODUODENOSCOPY (EGD) WITH PROPOFOL;  Surgeon: SChristene Lye MD;  Location: [facility] ENDOSCOPY;  Service: Endoscopy;  Laterality: N/A;   LAPAROSCOPIC RIGHT COLECTOMY Right 02/26/2016   Procedure: LAPAROSCOPIC RIGHT COLECTOMY;  Surgeon: SChristene Lye MD;  Location: [facility];  Service: General;  Laterality: Right;   PORTACATH PLACEMENT N/A 03/18/2016   Procedure: INSERTION PORT-A-CATH;  Surgeon: SChristene Lye MD;  Location:  [facility] OR[facility];  Service: General;  Laterality: N/A;   TOTAL HIP ARTHROPLASTY Left 02/2021   VASECTOMY     VEIN LIGATION Right 05/08/1979   Dr Pat Kocher    SOCIAL HISTORY: 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  Social  History   Socioeconomic History   Marital status: Single    Spouse name: Not on file   Number of children: Not on file   Years of education: Not on file   Highest education level: Not on file  Occupational History   Not on file  Tobacco Use   Smoking status: Never   Smokeless tobacco: Never  Vaping Use   Vaping Use: Never used  Substance and Sexual Activity   Alcohol use: Yes    Alcohol/week: 5.0 standard drinks of alcohol    Types: 5 Cans of beer per week    Comment: none last 24hrs   Drug use: No   Sexual activity: Not on file  Other Topics Concern   Not on file  Social History Narrative   Not on file   Social Determinants of Health   Financial Resource Strain: Not on file  Food Insecurity: Not on file  Transportation Needs: Not on file  Physical Activity: Not on file  Stress: Not on file  Social Connections: Not on file  Intimate Partner Violence: Not on file    FAMILY HISTORY: mat aunt- anal cancer; oldest brother/pre-cancerous polyps- [Dr. In [place]] Family History  Problem Relation Age of Onset   Kidney failure Mother    Cancer Father        spine    ALLERGIES:  is allergic to penicillins.  MEDICATIONS:  Current Outpatient Medications  Medication Sig Dispense Refill   diclofenac Sodium (VOLTAREN) 1 % GEL Apply topically 4 (four) times daily. (Patient not taking: Reported on 07/01/2021)     Multiple Vitamin (MULTIVITAMIN WITH MINERALS) TABS tablet Take 1 tablet by mouth daily.     rosuvastatin (CRESTOR) 20 MG tablet      Saw Palmetto, Serenoa repens, (SAW PALMETTO PO) Take 1 capsule by mouth daily.  (Patient not taking: Reported on 07/01/2021)     tiZANidine (ZANAFLEX) 4 MG tablet tizanidine 4 mg tablet (Patient not taking: Reported on 07/01/2021)     zinc gluconate 50 MG tablet Take 50 mg by mouth daily.     No current facility-administered medications for this visit.   Facility-Administered Medications Ordered in Other Visits  Medication Dose Route  Frequency Provider Last Rate Last Admin   sodium chloride flush (NS) 0.9 % injection 10 mL  10 mL Intravenous PRN Cammie Sickle, MD   10 mL at 05/04/17 0903      .  PHYSICAL EXAMINATION: ECOG PERFORMANCE STATUS: 0 - Asymptomatic  There were no vitals filed for this visit.  There were no vitals filed for this visit.   Physical Exam HENT:     Head: Normocephalic.  Cardiovascular:     Rate and Rhythm: Normal rate and regular rhythm.  Abdominal:     General: Abdomen is flat.     Palpations: Abdomen is soft.  Neurological:     Mental Status: He is alert and oriented to person, place, and time.     LABORATORY DATA:  I have reviewed the data as listed Lab Results  Component Value Date   WBC 9.2 08/23/2022   HGB 15.8 08/23/2022   HCT 45.8 08/23/2022   MCV 92.3 08/23/2022   PLT 210  08/23/2022   Recent Labs    01/19/22 0857  NA 138  K 4.8  CL 105  CO2 25  GLUCOSE 131*  BUN 14  CREATININE 0.90  CALCIUM 8.6*  GFRNONAA >60  PROT 7.2  ALBUMIN 4.0  AST 21  ALT 22  ALKPHOS 54  BILITOT 0.9     RADIOGRAPHIC STUDIES: I have personally reviewed the radiological images as listed and agreed with the findings in the report. No results found. ASSESSMENT & PLAN:   Patient is a 66 year old male who was diagnosed with stage III ascending colon cancer back in 2018 and is followed by Dr. Rogue Bussing.  Currently under surveillance and doing well.  Labs returned and are all normal.  Still awaiting tumor markers to result.  Had MRI of the liver on 02/02/2022 for a 7 mm lesion seen in his liver on previous CT scans which turned out to be a benign hepatic hemangioma without evidence of metastatic disease or abnormality.  His last colonoscopy was during the summer 2021 and he will be due for repeat in September 2026.  Will need a referral back to GI in a few years.  Based on guidelines, for stage III colon cancer he requires physical examination for a total of 5 years with a  colonoscopy in 3 years and then every 5 years.  No indications for imaging unless experiencing symptoms.  Disposition- Return to clinic in 1 year for follow-up with Dr. Rogue Bussing with labs. Repeat colonoscopy September 2026.  I spent 25 minutes dedicated to the care of this patient (face-to-face and non-face-to-face) on the date of the encounter to include what is described in the assessment and plan.  No problem-specific Assessment & Plan notes found for this encounter.     Jacquelin Hawking, NP 08/23/2022 10:45 AM

## 2022-08-24 LAB — CEA: CEA: 1.1 ng/mL (ref 0.0–4.7)

## 2023-08-24 ENCOUNTER — Ambulatory Visit: Payer: Medicare HMO | Admitting: Internal Medicine

## 2023-08-24 ENCOUNTER — Other Ambulatory Visit: Payer: Medicare HMO

## 2023-08-24 ENCOUNTER — Encounter: Payer: Self-pay | Admitting: Internal Medicine

## 2023-08-24 ENCOUNTER — Other Ambulatory Visit: Payer: Self-pay | Admitting: *Deleted

## 2023-08-24 DIAGNOSIS — C182 Malignant neoplasm of ascending colon: Secondary | ICD-10-CM

## 2023-08-25 ENCOUNTER — Inpatient Hospital Stay: Payer: Medicare HMO | Admitting: Nurse Practitioner

## 2023-08-25 ENCOUNTER — Encounter: Payer: Self-pay | Admitting: Nurse Practitioner

## 2023-08-25 ENCOUNTER — Inpatient Hospital Stay: Payer: Medicare HMO | Attending: Internal Medicine

## 2023-08-25 VITALS — BP 160/80 | HR 69 | Temp 96.8°F | Wt 193.0 lb

## 2023-08-25 DIAGNOSIS — Z85038 Personal history of other malignant neoplasm of large intestine: Secondary | ICD-10-CM | POA: Insufficient documentation

## 2023-08-25 DIAGNOSIS — Z08 Encounter for follow-up examination after completed treatment for malignant neoplasm: Secondary | ICD-10-CM | POA: Insufficient documentation

## 2023-08-25 DIAGNOSIS — C182 Malignant neoplasm of ascending colon: Secondary | ICD-10-CM

## 2023-08-25 LAB — CBC WITH DIFFERENTIAL (CANCER CENTER ONLY)
Abs Immature Granulocytes: 0.04 10*3/uL (ref 0.00–0.07)
Basophils Absolute: 0.1 10*3/uL (ref 0.0–0.1)
Basophils Relative: 1 %
Eosinophils Absolute: 0.1 10*3/uL (ref 0.0–0.5)
Eosinophils Relative: 1 %
HCT: 46.1 % (ref 39.0–52.0)
Hemoglobin: 15.7 g/dL (ref 13.0–17.0)
Immature Granulocytes: 1 %
Lymphocytes Relative: 25 %
Lymphs Abs: 1.6 10*3/uL (ref 0.7–4.0)
MCH: 32.8 pg (ref 26.0–34.0)
MCHC: 34.1 g/dL (ref 30.0–36.0)
MCV: 96.2 fL (ref 80.0–100.0)
Monocytes Absolute: 0.5 10*3/uL (ref 0.1–1.0)
Monocytes Relative: 8 %
Neutro Abs: 4.2 10*3/uL (ref 1.7–7.7)
Neutrophils Relative %: 64 %
Platelet Count: 179 10*3/uL (ref 150–400)
RBC: 4.79 MIL/uL (ref 4.22–5.81)
RDW: 12.4 % (ref 11.5–15.5)
WBC Count: 6.5 10*3/uL (ref 4.0–10.5)
nRBC: 0 % (ref 0.0–0.2)

## 2023-08-25 LAB — CMP (CANCER CENTER ONLY)
ALT: 41 U/L (ref 0–44)
AST: 34 U/L (ref 15–41)
Albumin: 4.1 g/dL (ref 3.5–5.0)
Alkaline Phosphatase: 52 U/L (ref 38–126)
Anion gap: 11 (ref 5–15)
BUN: 14 mg/dL (ref 8–23)
CO2: 24 mmol/L (ref 22–32)
Calcium: 9 mg/dL (ref 8.9–10.3)
Chloride: 104 mmol/L (ref 98–111)
Creatinine: 0.99 mg/dL (ref 0.61–1.24)
GFR, Estimated: >60 mL/min (ref >60)
Glucose, Bld: 144 mg/dL — ABNORMAL HIGH (ref 70–99)
Potassium: 4.5 mmol/L (ref 3.5–5.1)
Sodium: 139 mmol/L (ref 135–145)
Total Bilirubin: 0.6 mg/dL (ref <1.2)
Total Protein: 7.1 g/dL (ref 6.5–8.1)

## 2023-08-25 NOTE — Patient Instructions (Signed)
Thank you for allowing [place] to participate in your care. If you have any concerns in the future, please don't hesitate to reach out.

## 2023-08-25 NOTE — Progress Notes (Signed)
[facility]  [phone] [phone]   Patient Care Team: Jaclyn Shaggy, MD as PCP - General (Internal Medicine) Derwood Kaplan, MD (Internal Medicine) Kieth Brightly, MD (General Surgery) Earna Coder, MD as Consulting Physician (Internal Medicine)  CHIEF COMPLAINTS/PURPOSE OF CONSULTATION:  Oncology History Overview Note  # June-July 2017-Ascending COLON CA STAGE III [pT3pN2 (4/14LN positive); POSITIVE RADIAL MARGIN; Dr.Sankar]; pre-op CEA- 1.5/N; MRI- liver-NEG; 03/26/2016- PET-NED   # June 24th 2017-  FOLFOX q 2 W; s/p RT [? Positive margin- finished feb 2018]  # IDA- [aug 2017] s/p IV venofer x4.   # colonoscopy [Dr.Sankar; June 2018; repeat summer 2021 ]   # MOLECULAR TESTING: MSI-STABLE; F-ONE-[july 2017] B- RAF V 600 E; Wild type Kras/N-ras ; others** ------------------------------------------------------------------   DIAGNOSIS: COLON CA  STAGE: III        ;GOALS: cure  CURRENT/MOST RECENT THERAPY: Surveillance    Cancer of ascending colon (HCC)    HISTORY OF PRESENTING ILLNESS:  Patient is a 67 year old male with history of stage III colon cancer currently on surveillance here for annual surveillance visit. He continues to feel well and denies complaints. Denies abdominal pain. No changes in bowel movements, ribbon like or narrowing of stool caliber. No unintentional weight loss. Denies pain.   Review of Systems  Constitutional:  Negative for chills, fever, malaise/fatigue and weight loss.  Respiratory:  Negative for cough, hemoptysis, shortness of breath and wheezing.   Cardiovascular:  Negative for chest pain, palpitations and leg swelling.  Gastrointestinal:  Negative for abdominal pain, blood in stool, constipation, diarrhea, melena, nausea and vomiting.  Genitourinary:  Negative for dysuria and urgency.  Musculoskeletal:  Negative for back pain, falls, joint pain and myalgias.  Skin:  Negative  for itching and rash.  Neurological:  Negative for dizziness, tingling, sensory change, loss of consciousness, weakness and headaches.  Endo/Heme/Allergies:  Negative for environmental allergies. Does not bruise/bleed easily.  Psychiatric/Behavioral:  Negative for depression. The patient is not nervous/anxious and does not have insomnia.      MEDICAL HISTORY:  Past Medical History:  Diagnosis Date   Abnormal colonoscopy 02-2016   Anemia    Colon cancer (HCC)    colon ca, stage III   Enlarged prostate    GERD (gastroesophageal reflux disease)    H/O vasectomy 1990   Hemorrhoids    Hx of ligation of vein 1981   Neuropathy    feet and finger tips, secondary to chemo treatments   Sciatica    Status post colon resection 02-26-16    SURGICAL HISTORY: Past Surgical History:  Procedure Laterality Date   APPENDECTOMY     CATARACT EXTRACTION W/PHACO Right 05/15/2018   Procedure: CATARACT EXTRACTION PHACO AND INTRAOCULAR LENS PLACEMENT (IOC) RIGHT;  Surgeon: Nevada Crane, MD;  Location: [facility];  Service: Ophthalmology;  Laterality: Right;   COLONOSCOPY WITH PROPOFOL N/A 02/11/2016   Procedure: COLONOSCOPY WITH PROPOFOL;  Surgeon: Kieth Brightly, MD;  Location: [facility];  Service: Endoscopy;  Laterality: N/A;   COLONOSCOPY WITH PROPOFOL N/A 03/01/2017   Procedure: COLONOSCOPY WITH PROPOFOL;  Surgeon: Kieth Brightly, MD;  Location: [facility];  Service: Endoscopy;  Laterality: N/A;   COLONOSCOPY WITH PROPOFOL N/A 05/07/2020   Procedure: COLONOSCOPY WITH PROPOFOL;  Surgeon: Earline Mayotte, MD;  Location: [facility];  Service: Endoscopy;  Laterality: N/A;   ESOPHAGOGASTRODUODENOSCOPY (EGD) WITH PROPOFOL N/A 02/11/2016   Procedure: ESOPHAGOGASTRODUODENOSCOPY (EGD) WITH PROPOFOL;  Surgeon: Kieth Brightly, MD;  Location: [facility];  Service: Endoscopy;  Laterality: N/A;   LAPAROSCOPIC RIGHT COLECTOMY Right 02/26/2016   Procedure:  LAPAROSCOPIC RIGHT COLECTOMY;  Surgeon: Kieth Brightly, MD;  Location: [facility];  Service: General;  Laterality: Right;   PORTACATH PLACEMENT N/A 03/18/2016   Procedure: INSERTION PORT-A-CATH;  Surgeon: Kieth Brightly, MD;  Location: [facility];  Service: General;  Laterality: N/A;   TOTAL HIP ARTHROPLASTY Left 02/2021   VASECTOMY     VEIN LIGATION Right 05/08/1979   Dr Cleda Clarks   SOCIAL HISTORY: Patient is divorced. He is in IT sales professional. Occasional alcohol no smoking  Social History   Socioeconomic History   Marital status: Single    Spouse name: Not on file   Number of children: Not on file   Years of education: Not on file   Highest education level: Not on file  Occupational History   Not on file  Tobacco Use   Smoking status: Never   Smokeless tobacco: Never  Vaping Use   Vaping status: Never Used  Substance and Sexual Activity   Alcohol use: Yes    Alcohol/week: 5.0 standard drinks of alcohol    Types: 5 Cans of beer per week    Comment: none last 24hrs   Drug use: No   Sexual activity: Not on file  Other Topics Concern   Not on file  Social History Narrative   Not on file   Social Drivers of Health   Financial Resource Strain: Not on file  Food Insecurity: Not on file  Transportation Needs: Not on file  Physical Activity: Not on file  Stress: Not on file  Social Connections: Not on file  Intimate Partner Violence: Not on file    FAMILY HISTORY: mat aunt- anal cancer; oldest brother/pre-cancerous polyps- [Dr. In [place]] Family History  Problem Relation Age of Onset   Kidney failure Mother    Cancer Father        spine   ALLERGIES:  is allergic to penicillins.  MEDICATIONS:  Current Outpatient Medications  Medication Sig Dispense Refill   Multiple Vitamin (MULTIVITAMIN WITH MINERALS) TABS tablet Take 1 tablet by mouth daily.     rosuvastatin (CRESTOR) 20 MG tablet      zinc gluconate 50 MG tablet Take 50 mg by mouth daily.      No current facility-administered medications for this visit.   Facility-Administered Medications Ordered in Other Visits  Medication Dose Route Frequency Provider Last Rate Last Admin   sodium chloride flush (NS) 0.9 % injection 10 mL  10 mL Intravenous PRN Earna Coder, MD   10 mL at 05/04/17 0903    PHYSICAL EXAMINATION: ECOG PERFORMANCE STATUS: 0 - Asymptomatic  Vitals:   08/25/23 0926  BP: (!) 160/80  Pulse: 69  Temp: (!) 96.8 F (36 C)  SpO2: 100%   Filed Weights   08/25/23 0926  Weight: 193 lb (87.5 kg)    Physical Exam Vitals reviewed.  Constitutional:      Appearance: He is not ill-appearing.  HENT:     Head: Normocephalic.  Cardiovascular:     Rate and Rhythm: Normal rate and regular rhythm.  Pulmonary:     Effort: No respiratory distress.  Abdominal:     General: Abdomen is flat. There is no distension.     Palpations: Abdomen is soft.     Tenderness: There is no guarding.  Skin:    General: Skin is warm.     Coloration: Skin is not pale.  Neurological:     Mental Status: He is alert and oriented to person, place, and time.  Psychiatric:        Mood and Affect: Mood normal.        Behavior: Behavior normal.     LABORATORY DATA:  I have reviewed the data as listed Lab Results  Component Value Date   WBC 6.5 08/25/2023   HGB 15.7 08/25/2023   HCT 46.1 08/25/2023   MCV 96.2 08/25/2023   PLT 179 08/25/2023   Recent Labs    08/25/23 0916  NA 139  K 4.5  CL 104  CO2 24  GLUCOSE 144*  BUN 14  CREATININE 0.99  CALCIUM 9.0  GFRNONAA >60  PROT 7.1  ALBUMIN 4.1  AST 34  ALT 41  ALKPHOS 52  BILITOT 0.6   Component Ref Range & Units (hover) 1 yr ago (08/23/22) 2 yr ago (07/01/21) 2 yr ago (12/29/20) 3 yr ago (06/30/20) 3 yr ago (03/26/20) 5 yr ago (06/08/18) 5 yr ago (09/07/17)  CEA 1.1 1.0 CM 0.8 CM 1.2 CM 1.5 CM 1.7 CM 1.4 CM    RADIOGRAPHIC STUDIES: I have personally reviewed the radiological images as listed and agreed  with the findings in the report. No results found.  ASSESSMENT & PLAN:   Colon cancer Surveillance- hx of stage III ascending colon cancer diagnosed in 2018. Continues to do well and is clinically asymptomatic of recurrence. We reviewed surveillance recommendations including colonoscopy. Last was 2021, plan to repeat September 2026. He can contact GI for appt. He can now be released from med-onc surveillance and be followed by his PCP. CEA has remained normal. Consider imaging if concerning symptoms.  Liver lesion- MRI liver 02/02/22 for 7 mm lesion previously seen on CT. Thought to be benign hepatic hemangioma without evidence of metastatic disease or abnormality. No additional imaging needed.   Disposition- Return to clinic as needed.   No problem-specific Assessment & Plan notes found for this encounter.   Alinda Dooms, NP 08/25/2023   CC: Dr. Arlana Pouch

## 2023-08-26 LAB — CEA: CEA: 1.1 ng/mL (ref 0.0–4.7)

## 2024-02-15 ENCOUNTER — Telehealth: Payer: Self-pay

## 2024-02-15 NOTE — Telephone Encounter (Signed)
 Pt request for medical records were sent to [facility] to be sent to [facility]  [place]
